# Patient Record
Sex: Male | Born: 1935 | Race: White | Hispanic: No | Smoking: Former smoker
Health system: Southern US, Community
[De-identification: ages and names within clinical notes are randomized; demographics above are authoritative.]

## PROBLEM LIST (undated history)

## (undated) DIAGNOSIS — I4891 Unspecified atrial fibrillation: Secondary | ICD-10-CM

## (undated) DIAGNOSIS — G20A1 Parkinson's disease without dyskinesia, without mention of fluctuations: Secondary | ICD-10-CM

## (undated) DIAGNOSIS — G309 Alzheimer's disease, unspecified: Secondary | ICD-10-CM

## (undated) DIAGNOSIS — G629 Polyneuropathy, unspecified: Secondary | ICD-10-CM

## (undated) DIAGNOSIS — G20 Parkinson's disease: Secondary | ICD-10-CM

## (undated) DIAGNOSIS — R42 Dizziness and giddiness: Secondary | ICD-10-CM

## (undated) DIAGNOSIS — I509 Heart failure, unspecified: Secondary | ICD-10-CM

## (undated) DIAGNOSIS — D649 Anemia, unspecified: Secondary | ICD-10-CM

## (undated) DIAGNOSIS — G479 Sleep disorder, unspecified: Secondary | ICD-10-CM

## (undated) DIAGNOSIS — F419 Anxiety disorder, unspecified: Secondary | ICD-10-CM

## (undated) DIAGNOSIS — Z87442 Personal history of urinary calculi: Secondary | ICD-10-CM

## (undated) DIAGNOSIS — C4490 Unspecified malignant neoplasm of skin, unspecified: Secondary | ICD-10-CM

## (undated) DIAGNOSIS — F0280 Dementia in other diseases classified elsewhere without behavioral disturbance: Secondary | ICD-10-CM

## (undated) DIAGNOSIS — N200 Calculus of kidney: Secondary | ICD-10-CM

## (undated) DIAGNOSIS — I38 Endocarditis, valve unspecified: Secondary | ICD-10-CM

## (undated) DIAGNOSIS — L409 Psoriasis, unspecified: Secondary | ICD-10-CM

## (undated) DIAGNOSIS — E785 Hyperlipidemia, unspecified: Secondary | ICD-10-CM

## (undated) DIAGNOSIS — C61 Malignant neoplasm of prostate: Secondary | ICD-10-CM

## (undated) DIAGNOSIS — K922 Gastrointestinal hemorrhage, unspecified: Secondary | ICD-10-CM

## (undated) DIAGNOSIS — M1990 Unspecified osteoarthritis, unspecified site: Secondary | ICD-10-CM

## (undated) DIAGNOSIS — M4800 Spinal stenosis, site unspecified: Secondary | ICD-10-CM

## (undated) HISTORY — PX: CATARACT EXTRACTION: SUR2

## (undated) HISTORY — PX: PROSTATE SURGERY: SHX751

## (undated) HISTORY — DX: Dementia in other diseases classified elsewhere without behavioral disturbance: F02.80

## (undated) HISTORY — DX: Polyneuropathy, unspecified: G62.9

## (undated) HISTORY — DX: Alzheimer's disease, unspecified: G30.9

## (undated) HISTORY — DX: Unspecified osteoarthritis, unspecified site: M19.90

## (undated) HISTORY — DX: Unspecified malignant neoplasm of skin, unspecified: C44.90

## (undated) HISTORY — DX: Anxiety disorder, unspecified: F41.9

## (undated) HISTORY — DX: Calculus of kidney: N20.0

## (undated) HISTORY — DX: Spinal stenosis, site unspecified: M48.00

## (undated) HISTORY — PX: MOHS SURGERY: SHX181

## (undated) HISTORY — DX: Gastrointestinal hemorrhage, unspecified: K92.2

## (undated) HISTORY — PX: SPINE SURGERY: SHX786

## (undated) HISTORY — DX: Anemia, unspecified: D64.9

## (undated) HISTORY — PX: EYE SURGERY: SHX253

## (undated) HISTORY — DX: Sleep disorder, unspecified: G47.9

## (undated) HISTORY — DX: Endocarditis, valve unspecified: I38

---

## 2006-01-30 DIAGNOSIS — I509 Heart failure, unspecified: Secondary | ICD-10-CM

## 2006-01-30 HISTORY — DX: Heart failure, unspecified: I50.9

## 2006-11-29 DIAGNOSIS — C61 Malignant neoplasm of prostate: Secondary | ICD-10-CM

## 2006-11-29 HISTORY — DX: Malignant neoplasm of prostate: C61

## 2011-10-31 HISTORY — PX: OTHER SURGICAL HISTORY: SHX169

## 2013-07-22 ENCOUNTER — Ambulatory Visit: Payer: Self-pay | Admitting: Urology

## 2013-08-04 ENCOUNTER — Encounter: Payer: Self-pay | Admitting: Neurology

## 2013-08-30 ENCOUNTER — Encounter: Payer: Self-pay | Admitting: Neurology

## 2014-05-02 DIAGNOSIS — Z7901 Long term (current) use of anticoagulants: Secondary | ICD-10-CM | POA: Insufficient documentation

## 2014-05-11 DIAGNOSIS — C61 Malignant neoplasm of prostate: Secondary | ICD-10-CM | POA: Insufficient documentation

## 2014-05-11 DIAGNOSIS — G20 Parkinson's disease: Secondary | ICD-10-CM | POA: Insufficient documentation

## 2014-05-11 DIAGNOSIS — D510 Vitamin B12 deficiency anemia due to intrinsic factor deficiency: Secondary | ICD-10-CM | POA: Insufficient documentation

## 2014-05-11 DIAGNOSIS — K922 Gastrointestinal hemorrhage, unspecified: Secondary | ICD-10-CM | POA: Insufficient documentation

## 2014-05-11 DIAGNOSIS — I509 Heart failure, unspecified: Secondary | ICD-10-CM | POA: Insufficient documentation

## 2014-05-11 DIAGNOSIS — G479 Sleep disorder, unspecified: Secondary | ICD-10-CM | POA: Insufficient documentation

## 2014-05-11 DIAGNOSIS — F419 Anxiety disorder, unspecified: Secondary | ICD-10-CM | POA: Insufficient documentation

## 2014-05-11 DIAGNOSIS — E785 Hyperlipidemia, unspecified: Secondary | ICD-10-CM | POA: Insufficient documentation

## 2014-05-11 DIAGNOSIS — L4050 Arthropathic psoriasis, unspecified: Secondary | ICD-10-CM | POA: Insufficient documentation

## 2014-05-11 DIAGNOSIS — I38 Endocarditis, valve unspecified: Secondary | ICD-10-CM | POA: Insufficient documentation

## 2014-05-11 DIAGNOSIS — G629 Polyneuropathy, unspecified: Secondary | ICD-10-CM | POA: Insufficient documentation

## 2014-05-11 DIAGNOSIS — M48061 Spinal stenosis, lumbar region without neurogenic claudication: Secondary | ICD-10-CM | POA: Insufficient documentation

## 2014-05-11 DIAGNOSIS — N200 Calculus of kidney: Secondary | ICD-10-CM | POA: Insufficient documentation

## 2014-08-01 DIAGNOSIS — I4891 Unspecified atrial fibrillation: Secondary | ICD-10-CM | POA: Insufficient documentation

## 2014-08-18 ENCOUNTER — Ambulatory Visit: Payer: Self-pay | Admitting: Obstetrics and Gynecology

## 2014-11-30 ENCOUNTER — Ambulatory Visit: Payer: Self-pay | Admitting: Rheumatology

## 2015-01-04 DIAGNOSIS — M5136 Other intervertebral disc degeneration, lumbar region: Secondary | ICD-10-CM | POA: Insufficient documentation

## 2015-01-04 DIAGNOSIS — M5116 Intervertebral disc disorders with radiculopathy, lumbar region: Secondary | ICD-10-CM | POA: Insufficient documentation

## 2015-01-04 DIAGNOSIS — M5416 Radiculopathy, lumbar region: Secondary | ICD-10-CM | POA: Insufficient documentation

## 2015-01-21 DIAGNOSIS — G478 Other sleep disorders: Secondary | ICD-10-CM | POA: Insufficient documentation

## 2015-04-14 DIAGNOSIS — K117 Disturbances of salivary secretion: Secondary | ICD-10-CM | POA: Insufficient documentation

## 2015-11-28 ENCOUNTER — Emergency Department: Payer: Medicare Other

## 2015-11-28 ENCOUNTER — Observation Stay
Admit: 2015-11-28 | Discharge: 2015-11-28 | Disposition: A | Discharge status: Home / Self Care | Payer: Medicare Other | Attending: Internal Medicine | Admitting: Internal Medicine

## 2015-11-28 ENCOUNTER — Observation Stay: Payer: Medicare Other

## 2015-11-28 ENCOUNTER — Encounter: Payer: Self-pay | Admitting: Emergency Medicine

## 2015-11-28 ENCOUNTER — Observation Stay
Admission: EM | Admit: 2015-11-28 | Discharge: 2015-11-29 | Disposition: A | Discharge status: Home / Self Care | Payer: Medicare Other | Attending: Internal Medicine | Admitting: Internal Medicine

## 2015-11-28 DIAGNOSIS — W19XXXA Unspecified fall, initial encounter: Secondary | ICD-10-CM | POA: Diagnosis not present

## 2015-11-28 DIAGNOSIS — I509 Heart failure, unspecified: Secondary | ICD-10-CM | POA: Diagnosis not present

## 2015-11-28 DIAGNOSIS — R55 Syncope and collapse: Secondary | ICD-10-CM | POA: Diagnosis not present

## 2015-11-28 DIAGNOSIS — I110 Hypertensive heart disease with heart failure: Secondary | ICD-10-CM | POA: Diagnosis not present

## 2015-11-28 DIAGNOSIS — F0280 Dementia in other diseases classified elsewhere without behavioral disturbance: Secondary | ICD-10-CM

## 2015-11-28 DIAGNOSIS — G20 Parkinson's disease: Secondary | ICD-10-CM | POA: Insufficient documentation

## 2015-11-28 DIAGNOSIS — G309 Alzheimer's disease, unspecified: Secondary | ICD-10-CM | POA: Insufficient documentation

## 2015-11-28 DIAGNOSIS — Z880 Allergy status to penicillin: Secondary | ICD-10-CM | POA: Insufficient documentation

## 2015-11-28 DIAGNOSIS — I69992 Facial weakness following unspecified cerebrovascular disease: Secondary | ICD-10-CM | POA: Insufficient documentation

## 2015-11-28 DIAGNOSIS — I272 Other secondary pulmonary hypertension: Secondary | ICD-10-CM | POA: Diagnosis not present

## 2015-11-28 DIAGNOSIS — Z7952 Long term (current) use of systemic steroids: Secondary | ICD-10-CM | POA: Diagnosis not present

## 2015-11-28 DIAGNOSIS — M4802 Spinal stenosis, cervical region: Secondary | ICD-10-CM | POA: Insufficient documentation

## 2015-11-28 DIAGNOSIS — Z7901 Long term (current) use of anticoagulants: Secondary | ICD-10-CM | POA: Insufficient documentation

## 2015-11-28 DIAGNOSIS — F320 Major depressive disorder, single episode, mild: Secondary | ICD-10-CM | POA: Diagnosis not present

## 2015-11-28 DIAGNOSIS — R634 Abnormal weight loss: Secondary | ICD-10-CM | POA: Insufficient documentation

## 2015-11-28 DIAGNOSIS — F1010 Alcohol abuse, uncomplicated: Secondary | ICD-10-CM | POA: Insufficient documentation

## 2015-11-28 DIAGNOSIS — R42 Dizziness and giddiness: Secondary | ICD-10-CM | POA: Diagnosis not present

## 2015-11-28 DIAGNOSIS — Z87442 Personal history of urinary calculi: Secondary | ICD-10-CM | POA: Insufficient documentation

## 2015-11-28 DIAGNOSIS — Z79899 Other long term (current) drug therapy: Secondary | ICD-10-CM | POA: Diagnosis not present

## 2015-11-28 DIAGNOSIS — I739 Peripheral vascular disease, unspecified: Secondary | ICD-10-CM | POA: Diagnosis not present

## 2015-11-28 DIAGNOSIS — R531 Weakness: Secondary | ICD-10-CM | POA: Diagnosis not present

## 2015-11-28 DIAGNOSIS — I4891 Unspecified atrial fibrillation: Secondary | ICD-10-CM | POA: Insufficient documentation

## 2015-11-28 DIAGNOSIS — Z8546 Personal history of malignant neoplasm of prostate: Secondary | ICD-10-CM | POA: Insufficient documentation

## 2015-11-28 DIAGNOSIS — F419 Anxiety disorder, unspecified: Secondary | ICD-10-CM | POA: Diagnosis not present

## 2015-11-28 DIAGNOSIS — I639 Cerebral infarction, unspecified: Secondary | ICD-10-CM | POA: Diagnosis present

## 2015-11-28 DIAGNOSIS — L409 Psoriasis, unspecified: Secondary | ICD-10-CM | POA: Insufficient documentation

## 2015-11-28 HISTORY — DX: Parkinson's disease without dyskinesia, without mention of fluctuations: G20.A1

## 2015-11-28 HISTORY — DX: Heart failure, unspecified: I50.9

## 2015-11-28 HISTORY — DX: Parkinson's disease: G20

## 2015-11-28 HISTORY — DX: Malignant neoplasm of prostate: C61

## 2015-11-28 HISTORY — DX: Unspecified atrial fibrillation: I48.91

## 2015-11-28 HISTORY — DX: Psoriasis, unspecified: L40.9

## 2015-11-28 HISTORY — DX: Dizziness and giddiness: R42

## 2015-11-28 LAB — URINE DRUG SCREEN, QUALITATIVE (ARMC ONLY)
Amphetamines, Ur Screen: NOT DETECTED
BARBITURATES, UR SCREEN: NOT DETECTED
BENZODIAZEPINE, UR SCRN: POSITIVE — AB
CANNABINOID 50 NG, UR ~~LOC~~: NOT DETECTED
Cocaine Metabolite,Ur ~~LOC~~: NOT DETECTED
MDMA (Ecstasy)Ur Screen: POSITIVE — AB
METHADONE SCREEN, URINE: NOT DETECTED
OPIATE, UR SCREEN: NOT DETECTED
Phencyclidine (PCP) Ur S: NOT DETECTED
TRICYCLIC, UR SCREEN: NOT DETECTED

## 2015-11-28 LAB — URINALYSIS COMPLETE WITH MICROSCOPIC (ARMC ONLY)
Bacteria, UA: NONE SEEN
Bilirubin Urine: NEGATIVE
Glucose, UA: NEGATIVE mg/dL
LEUKOCYTES UA: NEGATIVE
Nitrite: NEGATIVE
PH: 6 (ref 5.0–8.0)
Protein, ur: NEGATIVE mg/dL
SPECIFIC GRAVITY, URINE: 1.015 (ref 1.005–1.030)

## 2015-11-28 LAB — DIFFERENTIAL
BASOS ABS: 0 10*3/uL (ref 0–0.1)
BASOS PCT: 1 %
EOS ABS: 0.1 10*3/uL (ref 0–0.7)
Eosinophils Relative: 2 %
LYMPHS ABS: 0.5 10*3/uL — AB (ref 1.0–3.6)
Lymphocytes Relative: 16 %
Monocytes Absolute: 0.4 10*3/uL (ref 0.2–1.0)
Monocytes Relative: 11 %
NEUTROS PCT: 70 %
Neutro Abs: 2.4 10*3/uL (ref 1.4–6.5)

## 2015-11-28 LAB — ETHANOL: Alcohol, Ethyl (B): <5 mg/dL (ref <5)

## 2015-11-28 LAB — COMPREHENSIVE METABOLIC PANEL
ALBUMIN: 3.8 g/dL (ref 3.5–5.0)
ALT: 5 U/L — ABNORMAL LOW (ref 17–63)
AST: 17 U/L (ref 15–41)
Alkaline Phosphatase: 69 U/L (ref 38–126)
Anion gap: 5 (ref 5–15)
BUN: 21 mg/dL — AB (ref 6–20)
CHLORIDE: 106 mmol/L (ref 101–111)
CO2: 26 mmol/L (ref 22–32)
Calcium: 8.8 mg/dL — ABNORMAL LOW (ref 8.9–10.3)
Creatinine, Ser: 1.15 mg/dL (ref 0.61–1.24)
GFR calc Af Amer: >60 mL/min (ref >60)
GFR calc non Af Amer: 57 mL/min — ABNORMAL LOW (ref >60)
GLUCOSE: 121 mg/dL — AB (ref 65–99)
POTASSIUM: 3.9 mmol/L (ref 3.5–5.1)
SODIUM: 137 mmol/L (ref 135–145)
Total Bilirubin: 0.6 mg/dL (ref 0.3–1.2)
Total Protein: 6.6 g/dL (ref 6.5–8.1)

## 2015-11-28 LAB — GLUCOSE, CAPILLARY: GLUCOSE-CAPILLARY: 127 mg/dL — AB (ref 65–99)

## 2015-11-28 LAB — TROPONIN I

## 2015-11-28 LAB — CBC
HEMATOCRIT: 31.2 % — AB (ref 40.0–52.0)
HEMOGLOBIN: 10.4 g/dL — AB (ref 13.0–18.0)
MCH: 34.5 pg — ABNORMAL HIGH (ref 26.0–34.0)
MCHC: 33.4 g/dL (ref 32.0–36.0)
MCV: 103.3 fL — AB (ref 80.0–100.0)
Platelets: 170 10*3/uL (ref 150–440)
RBC: 3.03 MIL/uL — ABNORMAL LOW (ref 4.40–5.90)
RDW: 13.4 % (ref 11.5–14.5)
WBC: 3.4 10*3/uL — ABNORMAL LOW (ref 3.8–10.6)

## 2015-11-28 LAB — PROTIME-INR
INR: 1.7
Prothrombin Time: 20 seconds — ABNORMAL HIGH (ref 11.4–15.0)

## 2015-11-28 LAB — APTT: APTT: 34 s (ref 24–36)

## 2015-11-28 MED ORDER — ONDANSETRON HCL 4 MG/2ML IJ SOLN: 4.0000 mg | Freq: Four times a day (QID) | INTRAMUSCULAR | Status: DC | PRN

## 2015-11-28 MED ORDER — ATORVASTATIN CALCIUM 20 MG PO TABS
40.0000 mg | ORAL_TABLET | Freq: Every day | ORAL | Status: DC
Start: 2015-11-28 — End: 2015-11-29
  Administered 2015-11-28: 18:00:00 40 mg via ORAL
  Filled 2015-11-28: qty 2

## 2015-11-28 MED ORDER — SENNOSIDES-DOCUSATE SODIUM 8.6-50 MG PO TABS: 1.0000 | ORAL_TABLET | Freq: Every evening | ORAL | Status: DC | PRN

## 2015-11-28 MED ORDER — WARFARIN SODIUM 5 MG PO TABS: 2.5000 mg | ORAL_TABLET | Freq: Every day | ORAL | Status: DC

## 2015-11-28 MED ORDER — WARFARIN SODIUM 5 MG PO TABS: 2.5000 mg | ORAL_TABLET | ORAL | Status: DC

## 2015-11-28 MED ORDER — CHLORDIAZEPOXIDE HCL 10 MG PO CAPS: 10.0000 mg | ORAL_CAPSULE | Freq: Every day | ORAL | Status: DC

## 2015-11-28 MED ORDER — PREDNISONE 5 MG PO TABS
2.5000 mg | ORAL_TABLET | Freq: Every day | ORAL | Status: DC
  Administered 2015-11-29: 2.5 mg via ORAL
  Filled 2015-11-28: qty 1

## 2015-11-28 MED ORDER — ALBUTEROL SULFATE (2.5 MG/3ML) 0.083% IN NEBU
2.5000 mg | INHALATION_SOLUTION | RESPIRATORY_TRACT | Status: DC | PRN
Start: 2015-11-28 — End: 2015-11-29

## 2015-11-28 MED ORDER — WARFARIN SODIUM 5 MG PO TABS: 5.0000 mg | ORAL_TABLET | ORAL | Status: DC

## 2015-11-28 MED ORDER — CARBIDOPA-LEVODOPA 25-100 MG PO TABS
1.5000 | ORAL_TABLET | Freq: Four times a day (QID) | ORAL | Status: DC
  Administered 2015-11-28 – 2015-11-29 (×4): 1.5 via ORAL
  Filled 2015-11-28: qty 1.5
  Filled 2015-11-28: qty 2
  Filled 2015-11-28: qty 1.5
  Filled 2015-11-28: qty 2
  Filled 2015-11-28: qty 1.5
  Filled 2015-11-28: qty 2

## 2015-11-28 MED ORDER — SODIUM CHLORIDE 0.9 % IJ SOLN
3.0000 mL | Freq: Two times a day (BID) | INTRAMUSCULAR | Status: DC
  Administered 2015-11-28 – 2015-11-29 (×2): 3 mL via INTRAVENOUS

## 2015-11-28 MED ORDER — ASPIRIN 81 MG PO CHEW
324.0000 mg | CHEWABLE_TABLET | Freq: Once | ORAL | Status: AC
  Administered 2015-11-28: 324 mg via ORAL
  Filled 2015-11-28: qty 4

## 2015-11-28 MED ORDER — ALFUZOSIN HCL ER 10 MG PO TB24
10.0000 mg | ORAL_TABLET | Freq: Every day | ORAL | Status: DC
  Administered 2015-11-28 – 2015-11-29 (×2): 10 mg via ORAL
  Filled 2015-11-28 (×2): qty 1

## 2015-11-28 MED ORDER — FOLIC ACID 1 MG PO TABS
1.0000 mg | ORAL_TABLET | Freq: Every day | ORAL | Status: DC
  Administered 2015-11-28 – 2015-11-29 (×2): 1 mg via ORAL
  Filled 2015-11-28 (×2): qty 1

## 2015-11-28 MED ORDER — WARFARIN SODIUM 5 MG PO TABS
5.0000 mg | ORAL_TABLET | ORAL | Status: DC
  Administered 2015-11-28: 18:00:00 5 mg via ORAL
  Filled 2015-11-28: qty 1

## 2015-11-28 MED ORDER — ACETAMINOPHEN 325 MG PO TABS: 650.0000 mg | ORAL_TABLET | Freq: Four times a day (QID) | ORAL | Status: DC | PRN

## 2015-11-28 MED ORDER — CLONAZEPAM 0.5 MG PO TABS
0.5000 mg | ORAL_TABLET | Freq: Every day | ORAL | Status: DC
  Administered 2015-11-28: 0.5 mg via ORAL
  Filled 2015-11-28: qty 1

## 2015-11-28 MED ORDER — CHLORDIAZEPOXIDE HCL 10 MG PO CAPS: 10.0000 mg | ORAL_CAPSULE | Freq: Three times a day (TID) | ORAL | Status: DC

## 2015-11-28 MED ORDER — ACETAMINOPHEN 650 MG RE SUPP: 650.0000 mg | Freq: Four times a day (QID) | RECTAL | Status: DC | PRN

## 2015-11-28 MED ORDER — FENOFIBRATE 160 MG PO TABS
160.0000 mg | ORAL_TABLET | Freq: Every evening | ORAL | Status: DC
Start: 2015-11-28 — End: 2015-11-29
  Administered 2015-11-28: 160 mg via ORAL
  Filled 2015-11-28: qty 1

## 2015-11-28 MED ORDER — HYDROCODONE-ACETAMINOPHEN 5-325 MG PO TABS: 1.0000 | ORAL_TABLET | ORAL | Status: DC | PRN

## 2015-11-28 MED ORDER — SODIUM CHLORIDE 0.9 % IV SOLN: 250.0000 mL | INTRAVENOUS | Status: DC | PRN

## 2015-11-28 MED ORDER — ONDANSETRON HCL 4 MG PO TABS: 4.0000 mg | ORAL_TABLET | Freq: Four times a day (QID) | ORAL | Status: DC | PRN

## 2015-11-28 MED ORDER — WARFARIN - PHYSICIAN DOSING INPATIENT: Freq: Every day | Status: DC

## 2015-11-28 MED ORDER — MELATONIN 3 MG PO CAPS: 3.0000 mg | ORAL_CAPSULE | Freq: Every evening | ORAL | Status: DC

## 2015-11-28 MED ORDER — ROPINIROLE HCL 1 MG PO TABS
5.0000 mg | ORAL_TABLET | Freq: Four times a day (QID) | ORAL | Status: DC
  Administered 2015-11-28 – 2015-11-29 (×4): 5 mg via ORAL
  Filled 2015-11-28 (×8): qty 5

## 2015-11-28 NOTE — ED Notes (Signed)
Pt nurse from [facility] here to give medication history to Education administrator. [place] RN reports change in pt baseline behavior since his wife died two months ago. Reports that pt is more confused and has been hallucinating. Reports that pt eating and sleeping habits have changed. Reports pt has been drinking alcohol. Reports that he will leave the facility in his car and return hours later.

## 2015-11-28 NOTE — ED Notes (Addendum)
Stroke nurse comments:  The patient arrived via EMS from an assisted living facility at [place], code stroke initiated and pt sent to CT for rt sided facial droop, weakness and syncopal episode (pt fell and struck the back of his head this morning)  with a best established LKW at 0830 today. NIHSS of 2 reflecting double vision and rt sided facial droop. Pt is alert and oriented. Pt takes coumadin for A-fib. SOC responded at 0958, after assessment, the [facility] stated that the pt's LKW was not very clear and symptoms too mild to treat. Primary nurse Opal Sidles RN will contact the pt's AL facility for clarification of pt's LKW and any symptoms that are new. Pt to be admitted for further treatment.

## 2015-11-28 NOTE — ED Provider Notes (Signed)
[facility] Emergency Department Provider Note  ____________________________________________  Time seen: Seen upon arrival to the emergency department  I have reviewed the triage vital signs and the nursing notes.   HISTORY  Chief Complaint Fall and Near Syncope    HPI [name] [name] is a 81 y.o. male with a history of Parkinson's as well as atrial fibrillation on Coumadin who is presenting today after a fall. He says that he did not slip but became dizzy and then fell backwards. He says this happened at about 8:30 AM. He says that he is no longer dizzy at this time but says that he feels weak all over. He denies any pain. Says that prior to 8:30 this morning he was feeling at his normal baseline which is weak all over. He says that he has not had a dizziness episode like this in the past.Says that he does have ringing in his bilateral ears but no pressure.   Past Medical History  Diagnosis Date  . Atrial fibrillation ([place])   . Parkinson's disease ([place])     There are no active problems to display for this patient.   No past surgical history on file.  No current outpatient prescriptions on file.  Allergies Penicillins  No family history on file.  Social History Social History  Substance Use Topics  . Smoking status: None  . Smokeless tobacco: None  . Alcohol Use: None    Review of Systems Constitutional: No fever/chills Eyes: No visual changes. ENT: No sore throat. Cardiovascular: Denies chest pain. Respiratory: Denies shortness of breath. Gastrointestinal: No abdominal pain.  No nausea, no vomiting.  No diarrhea.  No constipation. Genitourinary: Negative for dysuria. Musculoskeletal: Negative for back pain. Skin: Negative for rash. Neurological: Negative for headaches, focal weakness or numbness.  10-point ROS otherwise negative.  ____________________________________________   PHYSICAL EXAM:  VITAL SIGNS: ED Triage Vitals  Enc  Vitals Group     BP --      Pulse --      Resp --      Temp --      Temp src --      SpO2 --      Weight --      Height --      Head Cir --      Peak Flow --      Pain Score --      Pain Loc --      Pain Edu? --      Excl. in [place]? --     Constitutional: Alert and oriented. Well appearing and in no acute distress. Eyes: Conjunctivae are normal. PERRL. EOMI. Head: Atraumatic. Normal TMs bilaterally. Nose: No congestion/rhinnorhea. Mouth/Throat: Mucous membranes are moist.  Oropharynx non-erythematous. Neck: No stridor.   Cardiovascular: Normal rate, regular rhythm. Grossly normal heart sounds.  Good peripheral circulation. Respiratory: Normal respiratory effort.  No retractions. Lungs CTAB. Gastrointestinal: Soft and nontender. No distention. No abdominal bruits. No CVA tenderness. Musculoskeletal: No lower extremity tenderness nor edema.  No joint effusions. Neurologic:  Normal speech and language. Right-sided facial droop and the central distribution. Skin:  Skin is warm, dry and intact. No rash noted. Psychiatric: Mood and affect are normal. Speech and behavior are normal.  NIH Stroke Scale   Person Administering Scale: Doran Stabler  Administer stroke scale items in the order listed. Record performance in each category after each subscale exam. Do not go back and change scores. Follow directions provided for each exam technique.  Scores should reflect what the patient does, not what the clinician thinks the patient can do. The clinician should record answers while administering the exam and work quickly. Except where indicated, the patient should not be coached (i.e., repeated requests to patient to make a special effort).   1a  Level of consciousness: 0=alert; keenly responsive  1b. LOC questions:  0=Performs both tasks correctly  1c. LOC commands: 0=Performs both tasks correctly  2.  Best Gaze: 0=normal  3.  Visual: 0=No visual loss  4. Facial Palsy: 1=Minor  paralysis (flattened nasolabial fold, asymmetric on smiling)  5a.  Motor left arm: 0=No drift, limb holds 90 (or 45) degrees for full 10 seconds  5b.  Motor right arm: 0=No drift, limb holds 90 (or 45) degrees for full 10 seconds  6a. motor left leg: 0=No drift, limb holds 90 (or 45) degrees for full 10 seconds  6b  Motor right leg:  0=No drift, limb holds 90 (or 45) degrees for full 10 seconds  7. Limb Ataxia: 0=Absent  8.  Sensory: 0=Normal; no sensory loss  9. Best Language:  0=No aphasia, normal  10. Dysarthria: 0=Normal  11. Extinction and Inattention: 0=No abnormality  12. Distal motor function: 0=Normal   Total:   1   ____________________________________________   LABS (all labs ordered are listed, but only abnormal results are displayed)  Labs Reviewed  PROTIME-INR - Abnormal; Notable for the following:    Prothrombin Time 20.0 (*)    All other components within normal limits  CBC - Abnormal; Notable for the following:    WBC 3.4 (*)    RBC 3.03 (*)    Hemoglobin 10.4 (*)    HCT 31.2 (*)    MCV 103.3 (*)    MCH 34.5 (*)    All other components within normal limits  DIFFERENTIAL - Abnormal; Notable for the following:    Lymphs Abs 0.5 (*)    All other components within normal limits  COMPREHENSIVE METABOLIC PANEL - Abnormal; Notable for the following:    Glucose, Bld 121 (*)    BUN 21 (*)    Calcium 8.8 (*)    ALT 5 (*)    GFR calc non Af Amer 57 (*)    All other components within normal limits  GLUCOSE, CAPILLARY - Abnormal; Notable for the following:    Glucose-Capillary 127 (*)    All other components within normal limits  APTT  TROPONIN I  ETHANOL  URINALYSIS COMPLETEWITH MICROSCOPIC ([facility] ONLY)  URINE DRUG SCREEN, QUALITATIVE ([facility] ONLY)  I-STAT TROPOININ, ED  I-STAT CHEM 8, ED  I-STAT TROPOININ, ED   ____________________________________________  EKG  ED ECG REPORT I, [place],  Youlanda Roys, the attending physician, personally viewed and interpreted  this ECG.   Date: 11/28/2015  EKG Time: 1026  Rate: 105  Rhythm: atrial fibrillation, rate 105  Axis: Normal axis  Intervals: Long QTC at 504  ST&T Change: No ST segment elevation or depression. No abnormal T-wave inversion. Poor baseline in leads 1,2 and 3.  ____________________________________________  RADIOLOGY  No acute findings on the CAT scan of the brain. ____________________________________________   PROCEDURES  CRITICAL CARE Performed by: Doran Stabler   Total critical care time: 35 minutes  Critical care time was exclusive of separately billable procedures and treating other patients.  Critical care was necessary to treat or prevent imminent or life-threatening deterioration.  Critical care was time spent personally by me on the following activities: development of treatment plan with  patient and/or surrogate as well as nursing, discussions with consultants, evaluation of patient's response to treatment, examination of patient, obtaining history from patient or surrogate, ordering and performing treatments and interventions, ordering and review of laboratory studies, ordering and review of radiographic studies, pulse oximetry and re-evaluation of patient's condition.  TPA in consideration because the patient is within the window for this. He is on Coumadin and we will be checking his INR level to make sure that his INR is within a safe range for TPA. ____________________________________________   INITIAL IMPRESSION / ASSESSMENT AND PLAN / ED COURSE  Pertinent labs & imaging results that were available during my care of the patient were reviewed by me and considered in my medical decision making (see chart for details).  Patient made a stroke alert and call put out the teleneurology for consultation.  ----------------------------------------- 10:55 AM on 11/28/2015 -----------------------------------------  Discussed case with the [place] neurologist who  recommends admission. Does not recommend TPA at this time. Signed out to Dr. Darvin Neighbours. We'll give aspirin. INR at 1.7. ____________________________________________   FINAL CLINICAL IMPRESSION(S) / ED DIAGNOSES  Acute CVA.    Orbie Pyo, MD 11/28/15 1055

## 2015-11-28 NOTE — Progress Notes (Signed)
*  PRELIMINARY RESULTS* Echocardiogram 2D Echocardiogram has been performed.  [name] [name] 11/28/2015, 6:06 PM

## 2015-11-28 NOTE — ED Notes (Signed)
Patient transported to MRI 

## 2015-11-28 NOTE — Progress Notes (Signed)
Assumed patient care from prior RN 3:00 pm.   VSS No c/o pain  Patient is A&O One person assist to bathroom Patient is scoring a 2 on NIH Possible discharge tomorrow

## 2015-11-28 NOTE — Progress Notes (Signed)
Spoke with pharmacy and they stated not to give coumadin at 1415 but at 1800

## 2015-11-28 NOTE — Progress Notes (Signed)
   11/28/15 1000  Clinical Encounter Type  Visited With Patient  Visit Type Initial  Referral From Nurse  Consult/Referral To Chaplain  Spiritual Encounters  Spiritual Needs Emotional  Stress Factors  Patient Stress Factors Health changes  Visited with PT. Provided emotional support. Burley Saver [phone]

## 2015-11-28 NOTE — ED Notes (Signed)
Pt arrived via EMS from [facility] for an unwitnessed fall. Pt states he became dizzy and thinks he passed out. Pt states hit head on back of door when he fell.

## 2015-11-28 NOTE — H&P (Signed)
[place] [facility] [place] NAME: [name] [name]    MR#:  [id number]  DATE OF BIRTH:  12/28/1930  DATE OF ADMISSION:  11/28/2015  PRIMARY CARE PHYSICIAN: No primary care provider on file.   REQUESTING/REFERRING PHYSICIAN: Dr. Lucita Lora  CHIEF COMPLAINT:   Chief Complaint  Patient presents with  . Fall  . Near Syncope    HISTORY OF PRESENT ILLNESS:  [name] [name]  is a 81 y.o. male with a known history of A. fib, hypertension, psoriasis who is a resident of independent living presents to the hospital after he had a fall earlier today. He got acutely dizzy trying to reach for something on the wall and fell backwards hitting his head. No trauma. No loss of consciousness. Patient was noticed to have new right-sided facial droop and brought to the emergency room. Here CT scan of the head showed nothing acute. Was seen by tele neurology and recommended admission. Patient has no complaints of any dysphagia, change in vision, focal weakness or numbness.  PAST MEDICAL HISTORY:   Past Medical History  Diagnosis Date  . Atrial fibrillation ([place])   . Parkinson's disease ([place])   . CHF (congestive heart failure) ([place])   . Prostate cancer ([place])   . Vertigo   . Psoriasis     PAST SURGICAL HISTORY:  No past surgical history on file.  SOCIAL HISTORY:   Social History  Substance Use Topics  . Smoking status: Never Smoker   . Smokeless tobacco: Not on file  . Alcohol Use: Not on file    FAMILY HISTORY:   Family History  Problem Relation Age of Onset  . Hypertension Mother     DRUG ALLERGIES:   Allergies  Allergen Reactions  . Penicillins     REVIEW OF SYSTEMS:   Review of Systems  Constitutional: Negative for fever and chills.  HENT: Negative for sore throat.   Eyes: Negative for blurred vision, double vision and pain.  Respiratory: Negative for cough, hemoptysis, shortness of breath and wheezing.   Cardiovascular: Negative  for chest pain, palpitations, orthopnea and leg swelling.  Gastrointestinal: Negative for heartburn, nausea, vomiting, abdominal pain, diarrhea and constipation.  Genitourinary: Negative for dysuria and hematuria.  Musculoskeletal: Negative for back pain and joint pain.  Skin: Negative for rash.  Neurological: Positive for dizziness. Negative for sensory change, speech change, focal weakness and headaches.  Endo/Heme/Allergies: Does not bruise/bleed easily.  Psychiatric/Behavioral: Negative for depression. The patient is not nervous/anxious.     MEDICATIONS AT HOME:   Prior to Admission medications   Medication Sig Start Date End Date Taking? Authorizing Provider  acetaminophen (TYLENOL) 500 MG tablet Take 500 mg by mouth 2 (two) times daily.   Yes Historical Provider, MD  alfuzosin (UROXATRAL) 10 MG 24 hr tablet Take 10 mg by mouth daily.   Yes Historical Provider, MD  carbidopa-levodopa (SINEMET IR) 25-100 MG tablet Take 1 tablet by mouth 4 (four) times daily.   Yes Historical Provider, MD  chlordiazePOXIDE (LIBRIUM) 10 MG capsule Take 10 mg by mouth 3 (three) times daily.   Yes Historical Provider, MD  cholecalciferol (VITAMIN D) 1000 UNITS tablet Take 1,000 Units by mouth daily.   Yes Historical Provider, MD  clonazePAM (KLONOPIN) 1 MG tablet Take 1 mg by mouth every evening.   Yes Historical Provider, MD  fenofibrate (TRICOR) 145 MG tablet Take 145 mg by mouth daily.   Yes Historical Provider, MD  folic acid (FOLVITE) 1 MG tablet  Take 1 mg by mouth daily.   Yes Historical Provider, MD  hydrochlorothiazide (MICROZIDE) 12.5 MG capsule Take 12.5 mg by mouth 3 (three) times a week.   Yes Historical Provider, MD  hydrochlorothiazide (MICROZIDE) 12.5 MG capsule Take 12.5 mg by mouth 3 (three) times daily. On Monday, Wednesday, and Friday   Yes Historical Provider, MD  Melatonin 3 MG CAPS Take 3 mg by mouth every evening.   Yes Historical Provider, MD  methotrexate 2.5 MG tablet Take 15 mg by  mouth once a week. On Wednesday with a meal   Yes Historical Provider, MD  multivitamin-iron-minerals-folic acid (THERAPEUTIC-M) TABS tablet Take 1 tablet by mouth daily.   Yes Historical Provider, MD  predniSONE (DELTASONE) 2.5 MG tablet Take 2.5 mg by mouth daily with breakfast. 11/21/15  Yes Historical Provider, MD  ropinirole (REQUIP) 5 MG tablet Take 5 mg by mouth QID.   Yes Historical Provider, MD  vitamin B-12 (CYANOCOBALAMIN) 1000 MCG tablet Take 1,000 mcg by mouth daily.   Yes Historical Provider, MD  warfarin (COUMADIN) 2.5 MG tablet Take 2.5 mg by mouth daily.   Yes Historical Provider, MD  warfarin (COUMADIN) 5 MG tablet Take 5 mg by mouth daily. On Sunday, Tuesday, and Thursday at 6pm   Yes Historical Provider, MD      VITAL SIGNS:  Blood pressure 119/68, pulse 62, temperature 97.4 F (36.3 C), resp. rate 22, SpO2 97 %.  PHYSICAL EXAMINATION:  Physical Exam  GENERAL:  81 y.o.-year-old patient lying in the bed with no acute distress.  EYES: Pupils equal, round, reactive to light and accommodation. No scleral icterus. Extraocular muscles intact.  HEENT: Head atraumatic, normocephalic. Oropharynx and nasopharynx clear. No oropharyngeal erythema, moist oral mucosa  NECK:  Supple, no jugular venous distention. No thyroid enlargement, no tenderness.  LUNGS: Normal breath sounds bilaterally, no wheezing, rales, rhonchi. No use of accessory muscles of respiration.  CARDIOVASCULAR: S1, S2 normal. No murmurs, rubs, or gallops.  ABDOMEN: Soft, nontender, nondistended. Bowel sounds present. No organomegaly or mass.  EXTREMITIES: No pedal edema, cyanosis, or clubbing. + 2 pedal & radial pulses b/l.   NEUROLOGIC: Cranial nerves II through XII are intact. No focal Motor or sensory deficits appreciated b/l. Right facial droop PSYCHIATRIC: The patient is alert and awake. Oriented to place and time. SKIN: No obvious rash, lesion, or ulcer.   LABORATORY PANEL:   CBC  Recent Labs Lab  11/28/15 1015  WBC 3.4*  HGB 10.4*  HCT 31.2*  PLT 170   ------------------------------------------------------------------------------------------------------------------  Chemistries   Recent Labs Lab 11/28/15 1015  NA 137  K 3.9  CL 106  CO2 26  GLUCOSE 121*  BUN 21*  CREATININE 1.15  CALCIUM 8.8*  AST 17  ALT 5*  ALKPHOS 69  BILITOT 0.6   ------------------------------------------------------------------------------------------------------------------  Cardiac Enzymes  Recent Labs Lab 11/28/15 1015  TROPONINI <0.03   ------------------------------------------------------------------------------------------------------------------  RADIOLOGY:  Ct Head Wo Contrast  11/28/2015  CLINICAL DATA:  Dizziness after fall.  Right facial droop EXAM: CT HEAD WITHOUT CONTRAST TECHNIQUE: Contiguous axial images were obtained from the base of the skull through the vertex without intravenous contrast. COMPARISON:  None. FINDINGS: There is age related volume loss. There is no intracranial mass, hemorrhage, extra-axial fluid collection, or midline shift. There is mild small vessel disease in the centra semiovale bilaterally. There is also small vessel disease in each internal capsule anteriorly. No acute infarct evident. Middle cerebral artery attenuation is symmetric and normal bilaterally. The bony calvarium appears intact.  The mastoid air cells are clear. There is mild ethmoid sinus disease bilaterally. No intraorbital lesions are appreciable. IMPRESSION: Age related volume loss with mild supratentorial small vessel disease. No acute infarct evident. No mass or hemorrhage. Mild ethmoid sinus disease bilaterally. Critical Value/emergent results were called by telephone at the time of interpretation on 11/28/2015 at 10:02 am to Dr. Larae Grooms , who verbally acknowledged these results. Electronically Signed   By: Lowella Grip III M.D.   On: 11/28/2015 10:02     IMPRESSION AND  PLAN:   * Acute CVA with right-sided facial droop Seen by putting neurology and advised admission. Admit to medical bed. Patient has history of atrial fibrillation and is on Coumadin. Check echocardiogram, carotid Doppler, MRI. Neuro checks. Consult neurology.  * A. fib, rate control Continue Medication  * Hypertension Hold medications for permissive hypertension  * Dementia Patient's wife passed away 2 months prior as per nurse from [place]. Patient has been slowly worsening with his mental status since then.  * Alcohol abuse Spoke with Nurse at [facility] and patient is likely drinking alcohol daily. Not confirmed. Start CIWA if any withdrawals noticed.  * DVT prophylaxis On Coumadin   All the records are reviewed and case discussed with ED provider. Management plans discussed with the patient, family and they are in agreement.  CODE STATUS: FULL  TOTAL TIME TAKING CARE OF THIS PATIENT: 40 minutes.    Hillary Bow R M.D on 11/28/2015 at 11:35 AM  Between 7am to 6pm - Pager - [phone]  After 6pm go to [email or web address] - password [email or web address] [facility]  [place] [facility]  Office  [phone]  CC: Primary care physician; No primary care provider on file.     Note: This dictation was prepared with Dragon dictation along with smaller phrase technology. Any transcriptional errors that result from this process are unintentional.

## 2015-11-29 DIAGNOSIS — I639 Cerebral infarction, unspecified: Secondary | ICD-10-CM | POA: Diagnosis not present

## 2015-11-29 DIAGNOSIS — G309 Alzheimer's disease, unspecified: Secondary | ICD-10-CM

## 2015-11-29 DIAGNOSIS — F320 Major depressive disorder, single episode, mild: Secondary | ICD-10-CM | POA: Diagnosis not present

## 2015-11-29 DIAGNOSIS — F0280 Dementia in other diseases classified elsewhere without behavioral disturbance: Secondary | ICD-10-CM

## 2015-11-29 HISTORY — DX: Alzheimer's disease, unspecified: G30.9

## 2015-11-29 HISTORY — DX: Dementia in other diseases classified elsewhere, unspecified severity, without behavioral disturbance, psychotic disturbance, mood disturbance, and anxiety: F02.80

## 2015-11-29 LAB — BASIC METABOLIC PANEL
ANION GAP: 6 (ref 5–15)
BUN: 19 mg/dL (ref 6–20)
CHLORIDE: 108 mmol/L (ref 101–111)
CO2: 26 mmol/L (ref 22–32)
Calcium: 8.8 mg/dL — ABNORMAL LOW (ref 8.9–10.3)
Creatinine, Ser: 1.26 mg/dL — ABNORMAL HIGH (ref 0.61–1.24)
GFR calc Af Amer: 59 mL/min — ABNORMAL LOW (ref >60)
GFR, EST NON AFRICAN AMERICAN: 51 mL/min — AB (ref >60)
GLUCOSE: 95 mg/dL (ref 65–99)
POTASSIUM: 3.6 mmol/L (ref 3.5–5.1)
SODIUM: 140 mmol/L (ref 135–145)

## 2015-11-29 LAB — LIPID PANEL
CHOL/HDL RATIO: 3.1 ratio
Cholesterol: 136 mg/dL (ref 0–200)
HDL: 44 mg/dL (ref >40)
LDL CALC: 80 mg/dL (ref 0–99)
TRIGLYCERIDES: 59 mg/dL (ref <150)
VLDL: 12 mg/dL (ref 0–40)

## 2015-11-29 LAB — CBC
HCT: 30.5 % — ABNORMAL LOW (ref 40.0–52.0)
HEMOGLOBIN: 10.3 g/dL — AB (ref 13.0–18.0)
MCH: 34.8 pg — ABNORMAL HIGH (ref 26.0–34.0)
MCHC: 33.8 g/dL (ref 32.0–36.0)
MCV: 102.8 fL — ABNORMAL HIGH (ref 80.0–100.0)
PLATELETS: 163 10*3/uL (ref 150–440)
RBC: 2.97 MIL/uL — AB (ref 4.40–5.90)
RDW: 13.4 % (ref 11.5–14.5)
WBC: 4 10*3/uL (ref 3.8–10.6)

## 2015-11-29 LAB — PROTIME-INR
INR: 1.74
PROTHROMBIN TIME: 20.3 s — AB (ref 11.4–15.0)

## 2015-11-29 MED ORDER — CITALOPRAM HYDROBROMIDE 10 MG PO TABS: 10.0000 mg | ORAL_TABLET | Freq: Every day | ORAL | Status: DC

## 2015-11-29 MED ORDER — METHOTREXATE 2.5 MG PO TABS
15.0000 mg | ORAL_TABLET | ORAL | Status: DC
  Administered 2015-11-29: 15 mg via ORAL
  Filled 2015-11-29: qty 6

## 2015-11-29 MED ORDER — CARBIDOPA-LEVODOPA ER 50-200 MG PO TBCR: 1.0000 | EXTENDED_RELEASE_TABLET | Freq: Every day | ORAL | Status: DC

## 2015-11-29 MED ORDER — CARBIDOPA-LEVODOPA 25-100 MG PO TABS: 1.5000 | ORAL_TABLET | Freq: Four times a day (QID) | ORAL | Status: DC

## 2015-11-29 MED ORDER — MEMANTINE HCL 5 MG PO TABS: 5.0000 mg | ORAL_TABLET | Freq: Every day | ORAL | Status: DC

## 2015-11-29 MED ORDER — WARFARIN SODIUM 5 MG PO TABS: 5.0000 mg | ORAL_TABLET | ORAL | Status: DC

## 2015-11-29 MED ORDER — CARBIDOPA-LEVODOPA ER 50-200 MG PO TBCR
1.0000 | EXTENDED_RELEASE_TABLET | Freq: Every day | ORAL | Status: DC
  Filled 2015-11-29: qty 1

## 2015-11-29 MED ORDER — ATORVASTATIN CALCIUM 40 MG PO TABS: 40.0000 mg | ORAL_TABLET | Freq: Every day | ORAL | Status: DC

## 2015-11-29 NOTE — Progress Notes (Signed)
Discharge instructions given to daughter with prescriptions-escorted to car

## 2015-11-29 NOTE — Care Management (Signed)
Admitted to [facility] with the diagnosis of CVA. Lives at [facility] since November 2014. daughter is Derl Barrow [phone]). No home health. No skilled facility. No home oxygen. Uses a rolling walker/cane to aid in ambulation. Gets VIP help at [facility]. A person comes to Mr. Eno home and helps with medication management, light housework, meals. Last seen Dr. Doy Hutching about a month ago. [place] prior to this admission. Good appetite. Mr. Satkowiak has a long term life insurance policy per daughter. Possible discharge today per Dr Anselm Jungling. Shelbie Ammons RN MSN CCM Care Management [phone]

## 2015-11-29 NOTE — Care Management Obs Status (Signed)
[place] NOTIFICATION   Patient Details  Name: Imaan Ayson MRN: [id number] Date of Birth: [birth year]   Medicare Observation Status Notification Given:  Yes    Shelbie Ammons, RN 11/29/2015, 11:06 AM

## 2015-11-29 NOTE — Plan of Care (Addendum)
No c/o pain. VSS, afebrile. NIH 2. Neuro consulted. Will continue to assess.   Problem: Safety: Goal: Ability to remain free from injury will improve Outcome: Progressing High fall risk. Bed alarm on, hourly rounding. Pt understands how to use call system for assistance. Safe environment provided.   Problem: Health Behavior/Discharge Planning: Goal: Ability to manage health-related needs will improve Outcome: Progressing [facility] from [facility]. Unsteady, shuffling gait. Pt expressed concern about returning to live alone & is interested in finding someone to stay w/ him 24/7 to help w/ ADL's.

## 2015-11-29 NOTE — Progress Notes (Signed)
ANTICOAGULATION CONSULT NOTE - Initial Consult  Pharmacy Consult for warfarin Indication: atrial fibrillation  Allergies  Allergen Reactions  . Penicillins     Patient Measurements: Height: 5\' 7"  (170.2 cm) Weight: 172 lb 14.4 oz (78.427 kg) IBW/kg (Calculated) : 66.1   Vital Signs: Temp: 97.6 F (36.4 C) (11/30 1347) Temp Source: Oral (11/30 1347) BP: 124/62 mmHg (11/30 1347) Pulse Rate: 64 (11/30 1347)  Labs:  Recent Labs  11/28/15 1015 11/29/15 0435  HGB 10.4* 10.3*  HCT 31.2* 30.5*  PLT 170 163  APTT 34  --   LABPROT 20.0* 20.3*  INR 1.70 1.74  CREATININE 1.15 1.26*  TROPONINI <0.03  --     Estimated Creatinine Clearance: 41.5 mL/min (by C-G formula based on Cr of 1.26).   Medical History: Past Medical History  Diagnosis Date  . Atrial fibrillation ([place])   . Parkinson's disease ([place])   . CHF (congestive heart failure) ([place])   . Prostate cancer ([place])   . Vertigo   . Psoriasis     Medications:  Scheduled:  . alfuzosin  10 mg Oral Daily  . atorvastatin  40 mg Oral q1800  . carbidopa-levodopa  1 tablet Oral QHS  . carbidopa-levodopa  1.5 tablet Oral QID  . clonazePAM  0.5 mg Oral QHS  . fenofibrate  160 mg Oral QPM  . folic acid  1 mg Oral Daily  . methotrexate  15 mg Oral Weekly  . predniSONE  2.5 mg Oral Q breakfast  . ropinirole  5 mg Oral QID  . sodium chloride  3 mL Intravenous Q12H  . warfarin  2.5 mg Oral Q M,W,F,Sa-1800  . [START ON 11/30/2015] warfarin  5 mg Oral Once per day on Sun Tue Thu  . Warfarin - Physician Dosing Inpatient   Does not apply q1800    Assessment: Pharmacy consulted to dose warfarin in an 81 yo male with afib.  Patient admitted with CVA.  INR today of 1.74.  PTA dosing of warfarin 2.5 mg on M,W,F,Sat and 5 mg on all other days.   Goal of Therapy:  INR 2-3 Monitor platelets by anticoagulation protocol: Yes   Plan:  Will continue current warfarin dosing as INR is close to therapeutic.  Last dose per med rec  was on 11/28.  Unsure if patient received dose on 11/29 (no charted doses).  Will recheck INR in AM and assess need for dosing changes.   Pharmacy will continue to follow.  Amilya Haver G 11/29/2015,3:00 PM

## 2015-11-29 NOTE — Consult Note (Signed)
Reason for Consult: stroke Referring Physician:  Dr. Vachhani  [name] [name] is an 81 y.o. male.  HPI: 81 yo RHD  F presents to [facility] secondary to passing out.  Pt remembers most of the episode.  He recalls getting up and using the restroom and then trying to walk back to bed when he passed out.  He denied any chest pain, racing heart, nausea/vomiting or headache.  He did not have any tongue biting or incontinence.  He does not have a regular neurologist to adjust his Parkinsons meds.  He hopes to play golf again soon.  Past Medical History  Diagnosis Date  . Atrial fibrillation (HCC)   . Parkinson's disease (HCC)   . CHF (congestive heart failure) (HCC)   . Prostate cancer (HCC)   . Vertigo   . Psoriasis     No past surgical history on file.  Family History  Problem Relation Age of Onset  . Hypertension Mother     Social History:  reports that he has never smoked. He does not have any smokeless tobacco history on file. His alcohol and drug histories are not on file.  Allergies:  Allergies  Allergen Reactions  . Penicillins     Medications: personally reviewed by me as per chart  Results for orders placed or performed during the hospital encounter of 11/28/15 (from the past 48 hour(s))  Ethanol     Status: None   Collection Time: 11/28/15 10:15 AM  Result Value Ref Range   Alcohol, Ethyl (B) <5 <5 mg/dL    Comment:        LOWEST DETECTABLE LIMIT FOR SERUM ALCOHOL IS 5 mg/dL FOR MEDICAL PURPOSES ONLY   Protime-INR     Status: Abnormal   Collection Time: 11/28/15 10:15 AM  Result Value Ref Range   Prothrombin Time 20.0 (H) 11.4 - 15.0 seconds   INR 1.70   APTT     Status: None   Collection Time: 11/28/15 10:15 AM  Result Value Ref Range   aPTT 34 24 - 36 seconds  CBC     Status: Abnormal   Collection Time: 11/28/15 10:15 AM  Result Value Ref Range   WBC 3.4 (L) 3.8 - 10.6 K/uL   RBC 3.03 (L) 4.40 - 5.90 MIL/uL   Hemoglobin 10.4 (L) 13.0 - 18.0 g/dL   HCT 31.2  (L) 40.0 - 52.0 %   MCV 103.3 (H) 80.0 - 100.0 fL   MCH 34.5 (H) 26.0 - 34.0 pg   MCHC 33.4 32.0 - 36.0 g/dL   RDW 13.4 11.5 - 14.5 %   Platelets 170 150 - 440 K/uL  Differential     Status: Abnormal   Collection Time: 11/28/15 10:15 AM  Result Value Ref Range   Neutrophils Relative % 70 %   Neutro Abs 2.4 1.4 - 6.5 K/uL   Lymphocytes Relative 16 %   Lymphs Abs 0.5 (L) 1.0 - 3.6 K/uL   Monocytes Relative 11 %   Monocytes Absolute 0.4 0.2 - 1.0 K/uL   Eosinophils Relative 2 %   Eosinophils Absolute 0.1 0 - 0.7 K/uL   Basophils Relative 1 %   Basophils Absolute 0.0 0 - 0.1 K/uL  Comprehensive metabolic panel     Status: Abnormal   Collection Time: 11/28/15 10:15 AM  Result Value Ref Range   Sodium 137 135 - 145 mmol/L   Potassium 3.9 3.5 - 5.1 mmol/L   Chloride 106 101 - 111 mmol/L   CO2 26 22 -   32 mmol/L   Glucose, Bld 121 (H) 65 - 99 mg/dL   BUN 21 (H) 6 - 20 mg/dL   Creatinine, Ser 1.15 0.61 - 1.24 mg/dL   Calcium 8.8 (L) 8.9 - 10.3 mg/dL   Total Protein 6.6 6.5 - 8.1 g/dL   Albumin 3.8 3.5 - 5.0 g/dL   AST 17 15 - 41 U/L   ALT 5 (L) 17 - 63 U/L   Alkaline Phosphatase 69 38 - 126 U/L   Total Bilirubin 0.6 0.3 - 1.2 mg/dL   GFR calc non Af Amer 57 (L) >60 mL/min   GFR calc Af Amer >60 >60 mL/min    Comment: (NOTE) The eGFR has been calculated using the CKD EPI equation. This calculation has not been validated in all clinical situations. eGFR's persistently <60 mL/min signify possible Chronic Kidney Disease.    Anion gap 5 5 - 15  Troponin I     Status: None   Collection Time: 11/28/15 10:15 AM  Result Value Ref Range   Troponin I <0.03 <0.031 ng/mL    Comment:        NO INDICATION OF MYOCARDIAL INJURY.   Glucose, capillary     Status: Abnormal   Collection Time: 11/28/15 10:23 AM  Result Value Ref Range   Glucose-Capillary 127 (H) 65 - 99 mg/dL  Urinalysis complete, with microscopic ([facility] only)     Status: Abnormal   Collection Time: 11/28/15  3:59 PM   Result Value Ref Range   Color, Urine YELLOW (A) YELLOW   APPearance CLEAR (A) CLEAR   Glucose, UA NEGATIVE NEGATIVE mg/dL   Bilirubin Urine NEGATIVE NEGATIVE   Ketones, ur TRACE (A) NEGATIVE mg/dL   Specific Gravity, Urine 1.015 1.005 - 1.030   Hgb urine dipstick 1+ (A) NEGATIVE   pH 6.0 5.0 - 8.0   Protein, ur NEGATIVE NEGATIVE mg/dL   Nitrite NEGATIVE NEGATIVE   Leukocytes, UA NEGATIVE NEGATIVE   RBC / HPF 0-5 0 - 5 RBC/hpf   WBC, UA 0-5 0 - 5 WBC/hpf   Bacteria, UA NONE SEEN NONE SEEN   Squamous Epithelial / LPF 0-5 (A) NONE SEEN   Mucous PRESENT    Hyaline Casts, UA PRESENT   Urine Drug Screen, Qualitative ([facility] only)     Status: Abnormal   Collection Time: 11/28/15  3:59 PM  Result Value Ref Range   Tricyclic, Ur Screen NONE DETECTED NONE DETECTED   Amphetamines, Ur Screen NONE DETECTED NONE DETECTED   MDMA (Ecstasy)Ur Screen POSITIVE (A) NONE DETECTED   Cocaine Metabolite,Ur [place] NONE DETECTED NONE DETECTED   Opiate, Ur Screen NONE DETECTED NONE DETECTED   Phencyclidine (PCP) Ur S NONE DETECTED NONE DETECTED   Cannabinoid 50 Ng, Ur  NONE DETECTED NONE DETECTED   Barbiturates, Ur Screen NONE DETECTED NONE DETECTED   Benzodiazepine, Ur Scrn POSITIVE (A) NONE DETECTED   Methadone Scn, Ur NONE DETECTED NONE DETECTED    Comment: (NOTE) 100  Tricyclics, urine               Cutoff 1000 ng/mL 200  Amphetamines, urine             Cutoff 1000 ng/mL 300  MDMA (Ecstasy), urine           Cutoff 500 ng/mL 400  Cocaine Metabolite, urine       Cutoff 300 ng/mL 500  Opiate, urine                   Cutoff   300 ng/mL 600  Phencyclidine (PCP), urine      Cutoff 25 ng/mL 700  Cannabinoid, urine              Cutoff 50 ng/mL 800  Barbiturates, urine             Cutoff 200 ng/mL 900  Benzodiazepine, urine           Cutoff 200 ng/mL 1000 Methadone, urine                Cutoff 300 ng/mL 1100 1200 The urine drug screen provides only a preliminary, unconfirmed 1300 analytical test result and  should not be used for non-medical 1400 purposes. Clinical consideration and professional judgment should 1500 be applied to any positive drug screen result due to possible 1600 interfering substances. A more specific alternate chemical method 1700 must be used in order to obtain a confirmed analytical result.  1800 Gas chromato graphy / mass spectrometry (GC/MS) is the preferred 1900 confirmatory method.   Basic metabolic panel     Status: Abnormal   Collection Time: 11/29/15  4:35 AM  Result Value Ref Range   Sodium 140 135 - 145 mmol/L   Potassium 3.6 3.5 - 5.1 mmol/L   Chloride 108 101 - 111 mmol/L   CO2 26 22 - 32 mmol/L   Glucose, Bld 95 65 - 99 mg/dL   BUN 19 6 - 20 mg/dL   Creatinine, Ser 1.26 (H) 0.61 - 1.24 mg/dL   Calcium 8.8 (L) 8.9 - 10.3 mg/dL   GFR calc non Af Amer 51 (L) >60 mL/min   GFR calc Af Amer 59 (L) >60 mL/min    Comment: (NOTE) The eGFR has been calculated using the CKD EPI equation. This calculation has not been validated in all clinical situations. eGFR's persistently <60 mL/min signify possible Chronic Kidney Disease.    Anion gap 6 5 - 15  Protime-INR     Status: Abnormal   Collection Time: 11/29/15  4:35 AM  Result Value Ref Range   Prothrombin Time 20.3 (H) 11.4 - 15.0 seconds   INR 1.74   CBC     Status: Abnormal   Collection Time: 11/29/15  4:35 AM  Result Value Ref Range   WBC 4.0 3.8 - 10.6 K/uL   RBC 2.97 (L) 4.40 - 5.90 MIL/uL   Hemoglobin 10.3 (L) 13.0 - 18.0 g/dL   HCT 30.5 (L) 40.0 - 52.0 %   MCV 102.8 (H) 80.0 - 100.0 fL   MCH 34.8 (H) 26.0 - 34.0 pg   MCHC 33.8 32.0 - 36.0 g/dL   RDW 13.4 11.5 - 14.5 %   Platelets 163 150 - 440 K/uL  Lipid panel     Status: None   Collection Time: 11/29/15  4:35 AM  Result Value Ref Range   Cholesterol 136 0 - 200 mg/dL   Triglycerides 59 <150 mg/dL   HDL 44 >40 mg/dL   Total CHOL/HDL Ratio 3.1 RATIO   VLDL 12 0 - 40 mg/dL   LDL Cholesterol 80 0 - 99 mg/dL    Comment:        Total  Cholesterol/HDL:CHD Risk Coronary Heart Disease Risk Table                     Men   Women  1/2 Average Risk   3.4   3.3  Average Risk       5.0   4.4  2 X Average Risk     9.6   7.1  3 X Average Risk  23.4   11.0        Use the calculated Patient Ratio above and the CHD Risk Table to determine the patient's CHD Risk.        ATP III CLASSIFICATION (LDL):  <100     mg/dL   Optimal  100-129  mg/dL   Near or Above                    Optimal  130-159  mg/dL   Borderline  160-189  mg/dL   High  >190     mg/dL   Very High     Ct Head Wo Contrast  11/28/2015  CLINICAL DATA:  Dizziness after fall.  Right facial droop EXAM: CT HEAD WITHOUT CONTRAST TECHNIQUE: Contiguous axial images were obtained from the base of the skull through the vertex without intravenous contrast. COMPARISON:  None. FINDINGS: There is age related volume loss. There is no intracranial mass, hemorrhage, extra-axial fluid collection, or midline shift. There is mild small vessel disease in the centra semiovale bilaterally. There is also small vessel disease in each internal capsule anteriorly. No acute infarct evident. Middle cerebral artery attenuation is symmetric and normal bilaterally. The bony calvarium appears intact. The mastoid air cells are clear. There is mild ethmoid sinus disease bilaterally. No intraorbital lesions are appreciable. IMPRESSION: Age related volume loss with mild supratentorial small vessel disease. No acute infarct evident. No mass or hemorrhage. Mild ethmoid sinus disease bilaterally. Critical Value/emergent results were called by telephone at the time of interpretation on 11/28/2015 at 10:02 am to Dr. DAVID SCHAEVITZ , who verbally acknowledged these results. Electronically Signed   By: William  Woodruff III M.D.   On: 11/28/2015 10:02   Mr Brain Wo Contrast  11/28/2015  CLINICAL DATA:  81-year-old male with atrial fibrillation, Parkinson's disease and prostate cancer presenting with dizziness post  fall hitting back of head. Sudden onset of bilateral leg weakness. No loss of consciousness. Right facial droop. Subsequent encounter. EXAM: MRI HEAD WITHOUT CONTRAST TECHNIQUE: Multiplanar, multiecho pulse sequences of the brain and surrounding structures were obtained without intravenous contrast. COMPARISON:  11/28/2015 head CT. FINDINGS: No acute infarct. No intracranial hemorrhage. Mild small vessel disease type changes. Global atrophy without hydrocephalus. No intracranial mass lesion noted on this unenhanced exam. Major intracranial vascular structures are patent. Post lens replacement otherwise orbital structures unremarkable. Ethmoid sinus air cell left maxillary sinus mucosal thickening. C3-4 bulge/protrusion with spinal stenosis and mild cord flattening. Facet degenerative changes greater on the right. Endplate reactive changes. Cervical medullary junction, pituitary region and pineal region unremarkable. IMPRESSION: Exam is motion degraded. No acute infarct or intracranial hemorrhage. Mild small vessel disease type changes. Global atrophy without hydrocephalus. No intracranial mass lesion noted on this unenhanced exam. Ethmoid sinus air cell and left maxillary sinus mucosal thickening. C3-4 bulge/protrusion with spinal stenosis and mild cord flattening. Facet degenerative changes greater on the right. Endplate reactive changes. Electronically Signed   By: Steven  Olson M.D.   On: 11/28/2015 13:04   Us Carotid Bilateral  11/28/2015  CLINICAL DATA:  CVA. EXAM: BILATERAL CAROTID DUPLEX ULTRASOUND TECHNIQUE: Gray scale imaging, color Doppler and duplex ultrasound were performed of bilateral carotid and vertebral arteries in the neck. COMPARISON:  None. FINDINGS: Criteria: Quantification of carotid stenosis is based on velocity parameters that correlate the residual internal carotid diameter with NASCET-based stenosis levels, using the diameter of the distal internal carotid lumen as the denominator for    stenosis measurement. The following velocity measurements were obtained: RIGHT ICA:  56 cm/sec CCA:  73 cm/sec SYSTOLIC ICA/CCA RATIO:  0.8 DIASTOLIC ICA/CCA RATIO:  0.9 ECA:  130 cm/sec LEFT ICA:  92 cm/sec CCA:  84 cm/sec SYSTOLIC ICA/CCA RATIO:  1.1 DIASTOLIC ICA/CCA RATIO:  0.9 ECA:  114 cm/sec RIGHT CAROTID ARTERY: Small amount of echogenic plaque at the right carotid bulb without significant stenosis. Right external carotid artery is patent. Right internal carotid artery is patent without significant plaque or stenosis. RIGHT VERTEBRAL ARTERY: Antegrade flow and normal waveform in the right vertebral artery. LEFT CAROTID ARTERY: Mild echogenic plaque at the left carotid bulb without significant stenosis. Echogenic plaque at the origin of the external carotid artery. Minimal plaque in the proximal internal carotid artery without significant stenosis. LEFT VERTEBRAL ARTERY: Antegrade flow and normal waveform in the left vertebral artery. IMPRESSION: Mild atherosclerotic disease in the carotid arteries, predominantly at the carotid bulbs. Estimated degree of stenosis in the internal carotid arteries is less than 50% bilaterally. Patent vertebral arteries. Electronically Signed   By: Adam  Henn M.D.   On: 11/28/2015 17:44    Review of Systems  Constitutional: Negative.   HENT: Negative.   Eyes: Negative.   Respiratory: Negative.   Cardiovascular: Negative.   Gastrointestinal: Negative.   Genitourinary: Negative.   Musculoskeletal: Negative.   Skin: Negative.   Neurological: Positive for tremors and loss of consciousness. Negative for dizziness, tingling, sensory change, speech change, focal weakness and seizures.  Psychiatric/Behavioral: Negative.    Blood pressure 112/62, pulse 52, temperature 97.6 F (36.4 C), temperature source Oral, resp. rate 16, height 5' 7" (1.702 m), weight 78.427 kg (172 lb 14.4 oz), SpO2 97 %. Physical Exam  Nursing note and vitals reviewed. Constitutional: He appears  well-developed and well-nourished. No distress.  HENT:  Head: Normocephalic and atraumatic.  Right Ear: External ear normal.  Left Ear: External ear normal.  Nose: Nose normal.  Mouth/Throat: Oropharynx is clear and moist.  Eyes: Conjunctivae and EOM are normal. Pupils are equal, round, and reactive to light. No scleral icterus.  Neck: Normal range of motion. Neck supple.  Cardiovascular: Normal rate, regular rhythm, normal heart sounds and intact distal pulses.   No murmur heard. Respiratory: Effort normal and breath sounds normal. No respiratory distress.  GI: Soft. Bowel sounds are normal. He exhibits no distension.  Musculoskeletal: Normal range of motion. He exhibits no edema.  Neurological:  A+Ox4, nl speech and language but some bradyphrenia PERRLA, EOMI, nl VF, face symmetric with hypomimia, tongue midline 5/5 B, moderate bradykinesia, moderate tone increase, no tremor FTN WNL, stupped gait that is mildly widebased 1+/4 B, mute plantars B Nl temp and vibration  Skin: Skin is warm. He is not diaphoretic.  Psychiatric: He has a normal mood and affect.   CT of head personally reviewed by me and shows mild white matter changes  Assessment/Plan: 1.  Syncope-  This is common with Parkinsons but could be micturation related as well;  Would not aggressively treat unless episodes become more recurrent 2.  Parkinsons-  Not quite controlled -  Add Sinemet CR 50/200 qhs -  Continue Sinemet 25/100 1.5 tabs at 7am, 11am, 3pm and 7 pm -  Will sign off, please call with questions -  Needs to f/u with [facility] in 3 months  ,  11/29/2015, 12:22 PM      

## 2015-11-29 NOTE — Evaluation (Signed)
Physical Therapy Evaluation Patient Details Name: [name] [name] MRN: [id number] DOB: [birth year] Today's Date: 11/29/2015   History of Present Illness  Pt admitted for possible CVA. Pt complains of fall during ambulation from bathroom. Pt with history of Afib, HTN, and Parkinsons disease. Family reports wife passed away 2 months ago and now he currently lives alone at [facility].  Clinical Impression  Pt is a pleasant 81 year old male who admitted for fall and possible CVA. No strength/sensation/coordination deficits noted at this time. Pt demonstrates all bed mobility/transfers/ambulation at baseline level. Pt does not require any further PT needs at this time. Recommend to use rw initially at home to prevent falls. Pt completed 5 time sit<>stand in 13 seconds demonstrating good power. Pt will be dc in house and does not require follow up. RN aware. Will dc current orders.     Follow Up Recommendations No PT follow up    Equipment Recommendations       Recommendations for Other Services       Precautions / Restrictions Precautions Precautions: Fall Restrictions Weight Bearing Restrictions: No      Mobility  Bed Mobility Overal bed mobility: Independent             General bed mobility comments: safe technique performed  Transfers Overall transfer level: Modified independent Equipment used: Rolling walker (2 wheeled)             General transfer comment: sit<>Stand with safe technique.   Ambulation/Gait Ambulation/Gait assistance: Min guard Ambulation Distance (Feet): 190 Feet Assistive device: Rolling walker (2 wheeled) Gait Pattern/deviations: Step-through pattern     General Gait Details: ambulated using safe technique and reciprocal gait pattern. Good gait speed noted  Stairs            Wheelchair Mobility    Modified Rankin (Stroke Patients Only)       Balance Overall balance assessment: History of Falls;Needs  assistance Sitting-balance support: No upper extremity supported;Feet supported Sitting balance-Leahy Scale: Normal     Standing balance support: Bilateral upper extremity supported Standing balance-Leahy Scale: Good                               Pertinent Vitals/Pain Pain Assessment: No/denies pain    Home Living Family/patient expects to be discharged to:: Private residence Living Arrangements:  (Indep Living facility) Available Help at Discharge:  (ILF) Type of Home: Independent living facility Home Access: Level entry       Home Equipment: Walker - 2 wheels;Cane - single point      Prior Function Level of Independence: Independent         Comments: has SPC and RW that he occasionally uses     Hand Dominance        Extremity/Trunk Assessment   Upper Extremity Assessment: Overall WFL for tasks assessed           Lower Extremity Assessment: Overall WFL for tasks assessed         Communication   Communication: No difficulties  Cognition Arousal/Alertness: Awake/alert Behavior During Therapy: WFL for tasks assessed/performed Overall Cognitive Status: Within Functional Limits for tasks assessed                      General Comments      Exercises        Assessment/Plan    PT Assessment Patent does not need any further PT services  PT  Diagnosis Difficulty walking;Abnormality of gait   PT Problem List    PT Treatment Interventions     PT Goals (Current goals can be found in the Care Plan section) Acute Rehab PT Goals Patient Stated Goal: to go home PT Goal Formulation: With patient Time For Goal Achievement: 11/29/15 Potential to Achieve Goals: Good    Frequency     Barriers to discharge        Co-evaluation               End of Session Equipment Utilized During Treatment: Gait belt Activity Tolerance: Patient tolerated treatment well Patient left: in chair;with chair alarm set Nurse Communication:  Mobility status    Functional Assessment Tool Used: 5 time sit<>Stand Functional Limitation: Mobility: Walking and moving around Mobility: Walking and Moving Around Current Status [phone]): 0 percent impaired, limited or restricted Mobility: Walking and Moving Around Goal Status [phone]): 0 percent impaired, limited or restricted Mobility: Walking and Moving Around Discharge Status [phone]): 0 percent impaired, limited or restricted    Time: 1342-1400 PT Time Calculation (min) (ACUTE ONLY): 18 min   Charges:   PT Evaluation $Initial PT Evaluation Tier I: 1 Procedure     PT G Codes:   PT G-Codes **NOT FOR INPATIENT CLASS** Functional Assessment Tool Used: 5 time sit<>Stand Functional Limitation: Mobility: Walking and moving around Mobility: Walking and Moving Around Current Status [id number]): 0 percent impaired, limited or restricted Mobility: Walking and Moving Around Goal Status [phone]): 0 percent impaired, limited or restricted Mobility: Walking and Moving Around Discharge Status [phone]): 0 percent impaired, limited or restricted    Glennice Marcos 11/29/2015, 3:08 PM  Greggory Stallion, PT, DPT [phone]

## 2015-11-29 NOTE — Consult Note (Signed)
[facility] Face-to-Face Psychiatry Consult   Reason for Consult:  Consult for this 81 year old gentleman currently in the hospital because of a fall and concern about possible stroke symptoms. Consult regarding mood and anxiety primarily Referring Physician:  Anselm Jungling  Patient Identification: [name] [name] MRN:  [id number] Principal Diagnosis: Depression, major, single episode, mild ([place]) Diagnosis:   Patient Active Problem List   Diagnosis Date Noted  . Depression, major, single episode, mild ([place]) [F32.0] 11/29/2015  . Alzheimer's disease [G30.9] 11/29/2015  . CVA (cerebral infarction) [I63.9] 11/28/2015    Total Time spent with patient: 1 hour  Subjective:   [name] [name] is a 81 y.o. male patient admitted with "I wanted to talk with you because I have not been feeling very good".  HPI:  Information from the patient and the chart. Also from the patient's daughter. I had a long conversation with this patient and reviewed his current and past chart. Case discussed with hospitalist with nursing and with the patient's daughter. This 68 year old gentleman is currently in the hospital because of a fall which was followed by concerns about possible stroke symptoms. During his hospital stay he brought her to the attention of the treatment team that he was having some symptoms of depression and anxiety. Patient states that he does feel down and sad more often than not and this is been going on for about a month. His sleep is been chaotic. He stays up too late and then sleeps too much during the day. His appetite has been for and he has lost some weight. He denies that he is having any suicidal thoughts whatsoever or thought of wanting to "give up". He is still able to identify multiple positive things that he enjoys about his life. Additionally he seems to be ruminating a great deal about things that happened earlier in his life and is spending more time thinking about negative memories then positive  memories. Other symptoms include visual hallucinations. He has been seeing his recently deceased wife on a regular basis but also describes what are probably hypnagogic hallucinations seeing birds in his apartment when he wakes up in the middle of the night. Obvious recent stress is that his wife of 27 years died 22 month ago. Patient has Parkinson's disease and is on medication for that. Also several other medical problems. He also describes a lot of rumination about his life with some regrets not because he thinks that he had a bad life but because he misses his previous strength and excitement in his life. Patient is not currently taking any medication for depression or for memory impairment. He and especially his daughter have noticed more frequent memory impairment recently.  Social history: [facility] lives at the [facility] [place]. Sounds like he has some assistance in doing his housekeeping and is starting to get some assistance with medication management. Wife of 30 years died just a month ago. He has 2 adult children and has grandchildren as well. Sounds like they keep a good eye on him. 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  Medical history: Patient has atrial fibrillation and is on chronic anticoagulation. He has Parkinson's disease and is on medication for that. He also is on some medicine for his prostate and a history of multiple kidney stones.  Substance abuse history: Does not drink does not use drugs no history of substance abuse  Past Psychiatric History: Patient states he's never been in a psychiatric hospital never been  seen by a psychiatrist before. No history of suicide attempts. He does however reveal eventually that he is on chronic Librium. It sounds like he's been prescribed 10 mg of Librium on a regular basis for years which was started for nerves and the dose has never been adjusted.  Risk to Self: Is patient at risk for  suicide?: No Risk to Others:   Prior Inpatient Therapy:   Prior Outpatient Therapy:    Past Medical History:  Past Medical History  Diagnosis Date  . Atrial fibrillation ([place])   . Parkinson's disease ([place])   . CHF (congestive heart failure) ([place])   . Prostate cancer ([place])   . Vertigo   . Psoriasis    No past surgical history on file. Family History:  Family History  Problem Relation Age of Onset  . Hypertension Mother    Family Psychiatric  History: Patient could only remember that he had an aunt who had some kind of mental health problems but the daughter states that all of the extended immediate family have chronic anxiety symptoms as including her. Social History:  History  Alcohol Use: Not on file     History  Drug Use Not on file    Social History   Social History  . Marital Status: Married    Spouse Name: N/A  . Number of Children: N/A  . Years of Education: N/A   Social History Main Topics  . Smoking status: Never Smoker   . Smokeless tobacco: None  . Alcohol Use: None  . Drug Use: None  . Sexual Activity: Not Asked   Other Topics Concern  . None   Social History Narrative   Additional Social History:                          Allergies:   Allergies  Allergen Reactions  . Penicillins     Labs:  Results for orders placed or performed during the hospital encounter of 11/28/15 (from the past 48 hour(s))  Ethanol     Status: None   Collection Time: 11/28/15 10:15 AM  Result Value Ref Range   Alcohol, Ethyl (B) <5 <5 mg/dL    Comment:        LOWEST DETECTABLE LIMIT FOR SERUM ALCOHOL IS 5 mg/dL FOR MEDICAL PURPOSES ONLY   Protime-INR     Status: Abnormal   Collection Time: 11/28/15 10:15 AM  Result Value Ref Range   Prothrombin Time 20.0 (H) 11.4 - 15.0 seconds   INR 1.70   APTT     Status: None   Collection Time: 11/28/15 10:15 AM  Result Value Ref Range   aPTT 34 24 - 36 seconds  CBC     Status: Abnormal   Collection Time:  11/28/15 10:15 AM  Result Value Ref Range   WBC 3.4 (L) 3.8 - 10.6 K/uL   RBC 3.03 (L) 4.40 - 5.90 MIL/uL   Hemoglobin 10.4 (L) 13.0 - 18.0 g/dL   HCT 31.2 (L) 40.0 - 52.0 %   MCV 103.3 (H) 80.0 - 100.0 fL   MCH 34.5 (H) 26.0 - 34.0 pg   MCHC 33.4 32.0 - 36.0 g/dL   RDW 13.4 11.5 - 14.5 %   Platelets 170 150 - 440 K/uL  Differential     Status: Abnormal   Collection Time: 11/28/15 10:15 AM  Result Value Ref Range   Neutrophils Relative % 70 %   Neutro Abs 2.4 1.4 - 6.5 K/uL  Lymphocytes Relative 16 %   Lymphs Abs 0.5 (L) 1.0 - 3.6 K/uL   Monocytes Relative 11 %   Monocytes Absolute 0.4 0.2 - 1.0 K/uL   Eosinophils Relative 2 %   Eosinophils Absolute 0.1 0 - 0.7 K/uL   Basophils Relative 1 %   Basophils Absolute 0.0 0 - 0.1 K/uL  Comprehensive metabolic panel     Status: Abnormal   Collection Time: 11/28/15 10:15 AM  Result Value Ref Range   Sodium 137 135 - 145 mmol/L   Potassium 3.9 3.5 - 5.1 mmol/L   Chloride 106 101 - 111 mmol/L   CO2 26 22 - 32 mmol/L   Glucose, Bld 121 (H) 65 - 99 mg/dL   BUN 21 (H) 6 - 20 mg/dL   Creatinine, Ser 1.15 0.61 - 1.24 mg/dL   Calcium 8.8 (L) 8.9 - 10.3 mg/dL   Total Protein 6.6 6.5 - 8.1 g/dL   Albumin 3.8 3.5 - 5.0 g/dL   AST 17 15 - 41 U/L   ALT 5 (L) 17 - 63 U/L   Alkaline Phosphatase 69 38 - 126 U/L   Total Bilirubin 0.6 0.3 - 1.2 mg/dL   GFR calc non Af Amer 57 (L) >60 mL/min   GFR calc Af Amer >60 >60 mL/min    Comment: (NOTE) The eGFR has been calculated using the CKD EPI equation. This calculation has not been validated in all clinical situations. eGFR's persistently <60 mL/min signify possible Chronic Kidney Disease.    Anion gap 5 5 - 15  Troponin I     Status: None   Collection Time: 11/28/15 10:15 AM  Result Value Ref Range   Troponin I <0.03 <0.031 ng/mL    Comment:        NO INDICATION OF MYOCARDIAL INJURY.   Glucose, capillary     Status: Abnormal   Collection Time: 11/28/15 10:23 AM  Result Value Ref Range    Glucose-Capillary 127 (H) 65 - 99 mg/dL  Urinalysis complete, with microscopic ([facility] only)     Status: Abnormal   Collection Time: 11/28/15  3:59 PM  Result Value Ref Range   Color, Urine YELLOW (A) YELLOW   APPearance CLEAR (A) CLEAR   Glucose, UA NEGATIVE NEGATIVE mg/dL   Bilirubin Urine NEGATIVE NEGATIVE   Ketones, ur TRACE (A) NEGATIVE mg/dL   Specific Gravity, Urine 1.015 1.005 - 1.030   Hgb urine dipstick 1+ (A) NEGATIVE   pH 6.0 5.0 - 8.0   Protein, ur NEGATIVE NEGATIVE mg/dL   Nitrite NEGATIVE NEGATIVE   Leukocytes, UA NEGATIVE NEGATIVE   RBC / HPF 0-5 0 - 5 RBC/hpf   WBC, UA 0-5 0 - 5 WBC/hpf   Bacteria, UA NONE SEEN NONE SEEN   Squamous Epithelial / LPF 0-5 (A) NONE SEEN   Mucous PRESENT    Hyaline Casts, UA PRESENT   Urine Drug Screen, Qualitative ([facility] only)     Status: Abnormal   Collection Time: 11/28/15  3:59 PM  Result Value Ref Range   Tricyclic, Ur Screen NONE DETECTED NONE DETECTED   Amphetamines, Ur Screen NONE DETECTED NONE DETECTED   MDMA (Ecstasy)Ur Screen POSITIVE (A) NONE DETECTED   Cocaine Metabolite,Ur [place] NONE DETECTED NONE DETECTED   Opiate, Ur Screen NONE DETECTED NONE DETECTED   Phencyclidine (PCP) Ur S NONE DETECTED NONE DETECTED   Cannabinoid 50 Ng, Ur [place] NONE DETECTED NONE DETECTED   Barbiturates, Ur Screen NONE DETECTED NONE DETECTED   Benzodiazepine, Ur Scrn POSITIVE (A)  NONE DETECTED   Methadone Scn, Ur NONE DETECTED NONE DETECTED    Comment: (NOTE) 867  Tricyclics, urine               Cutoff 1000 ng/mL 200  Amphetamines, urine             Cutoff 1000 ng/mL 300  MDMA (Ecstasy), urine           Cutoff 500 ng/mL 400  Cocaine Metabolite, urine       Cutoff 300 ng/mL 500  Opiate, urine                   Cutoff 300 ng/mL 600  Phencyclidine (PCP), urine      Cutoff 25 ng/mL 700  Cannabinoid, urine              Cutoff 50 ng/mL 800  Barbiturates, urine             Cutoff 200 ng/mL 900  Benzodiazepine, urine           Cutoff 200 ng/mL 1000  Methadone, urine                Cutoff 300 ng/mL 1100 1200 The urine drug screen provides only a preliminary, unconfirmed 1300 analytical test result and should not be used for non-medical 1400 purposes. Clinical consideration and professional judgment should 1500 be applied to any positive drug screen result due to possible 1600 interfering substances. A more specific alternate chemical method 1700 must be used in order to obtain a confirmed analytical result.  1800 Gas chromato graphy / mass spectrometry (GC/MS) is the preferred 1900 confirmatory method.   Basic metabolic panel     Status: Abnormal   Collection Time: 11/29/15  4:35 AM  Result Value Ref Range   Sodium 140 135 - 145 mmol/L   Potassium 3.6 3.5 - 5.1 mmol/L   Chloride 108 101 - 111 mmol/L   CO2 26 22 - 32 mmol/L   Glucose, Bld 95 65 - 99 mg/dL   BUN 19 6 - 20 mg/dL   Creatinine, Ser 1.26 (H) 0.61 - 1.24 mg/dL   Calcium 8.8 (L) 8.9 - 10.3 mg/dL   GFR calc non Af Amer 51 (L) >60 mL/min   GFR calc Af Amer 59 (L) >60 mL/min    Comment: (NOTE) The eGFR has been calculated using the CKD EPI equation. This calculation has not been validated in all clinical situations. eGFR's persistently <60 mL/min signify possible Chronic Kidney Disease.    Anion gap 6 5 - 15  Protime-INR     Status: Abnormal   Collection Time: 11/29/15  4:35 AM  Result Value Ref Range   Prothrombin Time 20.3 (H) 11.4 - 15.0 seconds   INR 1.74   CBC     Status: Abnormal   Collection Time: 11/29/15  4:35 AM  Result Value Ref Range   WBC 4.0 3.8 - 10.6 K/uL   RBC 2.97 (L) 4.40 - 5.90 MIL/uL   Hemoglobin 10.3 (L) 13.0 - 18.0 g/dL   HCT 30.5 (L) 40.0 - 52.0 %   MCV 102.8 (H) 80.0 - 100.0 fL   MCH 34.8 (H) 26.0 - 34.0 pg   MCHC 33.8 32.0 - 36.0 g/dL   RDW 13.4 11.5 - 14.5 %   Platelets 163 150 - 440 K/uL  Lipid panel     Status: None   Collection Time: 11/29/15  4:35 AM  Result Value Ref Range   Cholesterol 136 0 -  200 mg/dL   Triglycerides 59  <150 mg/dL   HDL 44 >40 mg/dL   Total CHOL/HDL Ratio 3.1 RATIO   VLDL 12 0 - 40 mg/dL   LDL Cholesterol 80 0 - 99 mg/dL    Comment:        Total Cholesterol/HDL:CHD Risk Coronary Heart Disease Risk Table                     Men   Women  1/2 Average Risk   3.4   3.3  Average Risk       5.0   4.4  2 X Average Risk   9.6   7.1  3 X Average Risk  23.4   11.0        Use the calculated Patient Ratio above and the CHD Risk Table to determine the patient's CHD Risk.        ATP III CLASSIFICATION (LDL):  <100     mg/dL   Optimal  100-129  mg/dL   Near or Above                    Optimal  130-159  mg/dL   Borderline  160-189  mg/dL   High  >190     mg/dL   Very High     Current Facility-Administered Medications  Medication Dose Route Frequency Provider Last Rate Last Dose  . 0.9 %  sodium chloride infusion  250 mL Intravenous PRN Hillary Bow, MD      . acetaminophen (TYLENOL) tablet 650 mg  650 mg Oral Q6H PRN Hillary Bow, MD       Or  . acetaminophen (TYLENOL) suppository 650 mg  650 mg Rectal Q6H PRN Srikar Sudini, MD      . albuterol (PROVENTIL) (2.5 MG/3ML) 0.083% nebulizer solution 2.5 mg  2.5 mg Nebulization Q2H PRN Srikar Sudini, MD      . alfuzosin (UROXATRAL) 24 hr tablet 10 mg  10 mg Oral Daily Hillary Bow, MD   10 mg at 11/29/15 0802  . atorvastatin (LIPITOR) tablet 40 mg  40 mg Oral q1800 Hillary Bow, MD   40 mg at 11/28/15 1820  . carbidopa-levodopa (SINEMET CR) 50-200 MG per tablet controlled release 1 tablet  1 tablet Oral QHS Valora Corporal, MD      . carbidopa-levodopa (SINEMET IR) 25-100 MG per tablet immediate release 1.5 tablet  1.5 tablet Oral QID Hillary Bow, MD   1.5 tablet at 11/29/15 1329  . clonazePAM (KLONOPIN) tablet 0.5 mg  0.5 mg Oral QHS Srikar Sudini, MD   0.5 mg at 11/28/15 2146  . fenofibrate tablet 160 mg  160 mg Oral QPM Srikar Sudini, MD   160 mg at 11/28/15 1821  . folic acid (FOLVITE) tablet 1 mg  1 mg Oral Daily Hillary Bow, MD   1 mg  at 11/29/15 0802  . HYDROcodone-acetaminophen (NORCO/VICODIN) 5-325 MG per tablet 1-2 tablet  1-2 tablet Oral Q4H PRN Srikar Sudini, MD      . methotrexate (RHEUMATREX) tablet 15 mg  15 mg Oral Weekly Vaughan Basta, MD   15 mg at 11/29/15 1543  . ondansetron (ZOFRAN) tablet 4 mg  4 mg Oral Q6H PRN Hillary Bow, MD       Or  . ondansetron (ZOFRAN) injection 4 mg  4 mg Intravenous Q6H PRN Srikar Sudini, MD      . predniSONE (DELTASONE) tablet 2.5 mg  2.5 mg Oral Q breakfast Hillary Bow, MD  2.5 mg at 11/29/15 0802  . rOPINIRole (REQUIP) tablet 5 mg  5 mg Oral QID Hillary Bow, MD   5 mg at 11/29/15 1543  . senna-docusate (Senokot-S) tablet 1 tablet  1 tablet Oral QHS PRN Srikar Sudini, MD      . sodium chloride 0.9 % injection 3 mL  3 mL Intravenous Q12H Hillary Bow, MD   3 mL at 11/29/15 0804  . warfarin (COUMADIN) tablet 2.5 mg  2.5 mg Oral Q M,W,F,Sa-1800 Srikar Sudini, MD      . Derrill Memo ON 11/30/2015] warfarin (COUMADIN) tablet 5 mg  5 mg Oral Once per day on Sun Tue Thu Loleta Dicker, [place]      . Warfarin - Physician Dosing Inpatient   Does not apply q1800 Hillary Bow, MD        Musculoskeletal: Strength & Muscle Tone: decreased Gait & Station: ataxic Patient leans: Front  Psychiatric Specialty Exam: Review of Systems  Constitutional: Positive for weight loss.  HENT: Negative.   Eyes: Positive for double vision.  Respiratory: Negative.   Cardiovascular: Negative.   Gastrointestinal: Negative.   Musculoskeletal: Negative.   Skin: Negative.   Neurological: Positive for weakness.  Endo/Heme/Allergies: Bruises/bleeds easily.  Psychiatric/Behavioral: Positive for depression, hallucinations and memory loss. Negative for suicidal ideas and substance abuse. The patient is nervous/anxious and has insomnia.     Blood pressure 124/62, pulse 64, temperature 97.6 F (36.4 C), temperature source Oral, resp. rate 19, height '5\' 7"'  (1.702 m), weight 78.427 kg (172 lb 14.4 oz),  SpO2 95 %.Body mass index is 27.07 kg/(m^2).  General Appearance: Casual  Eye Contact::  Good  Speech:  Slow  Volume:  Normal  Mood:  Dysphoric  Affect:  Congruent  Thought Process:  Circumstantial and Tangential  Orientation:  Full (Time, Place, and Person)  Thought Content:  Hallucinations: Visual  Suicidal Thoughts:  No  Homicidal Thoughts:  No  Memory:  Immediate;   Good Recent;   Poor Remote;   Good  Judgement:  Fair  Insight:  Fair  Psychomotor Activity:  Decreased  Concentration:  Fair  Recall:  [employer] of Knowledge:Fair  Language: Fair  Akathisia:  No  Handed:  Right  AIMS (if indicated):     Assets:  Communication Skills Desire for Improvement Financial Resources/Insurance Housing Intimacy Leisure Time Resilience Social Support  ADL's:  Intact  Cognition: Impaired,  Mild and Moderate  Sleep:      Treatment Plan Summary: Medication management and Plan This is an 81 year old man who has some concerns about anxiety and depression. He has had a recent severe trauma with the death of his wife. Under the circumstances it is to be expected that he will have some symptoms that could represent depression. It sounds like these are prominent enough and are impairing enough to him that he would be reasonable to at least try medication treatment. I am going to recommend 10 mg of citalopram be started for mood and anxiety. Additionally I note that his short-term memory is quite bad. He could not remember any of 3 words at 3 minutes. He made several of the kinds of mistakes during our conversation typical of having Alzheimer's disease. I think this is exactly the kind of gentleman who should be on medicine to slow down and memory loss. I will start him on Namenda 5 mg per day. I have counseled his daughter that both of these medicines could be gradually titrated up if they are tolerated. Acknowledge the potential  for side effects but I think that in both cases there is quite a bit  of potential benefit to be gained. I encouraged the patient and his daughter to make sure they keep an eye on his symptoms and if things are getting worse to bring it to the attention of his outpatient physician. I reassured him that having visual hallucinations of his wife and even the hypnagogic hallucinations are actually considered normal. I don't think there would be any reason to start an antipsychotic medicine. Patient and daughter expressed understanding. Orders placed. Prescriptions written. Case reviewed with nursing.  Disposition: Patient does not meet criteria for psychiatric inpatient admission. Supportive therapy provided about ongoing stressors.  John Clapacs 11/29/2015 5:13 PM

## 2015-11-29 NOTE — Discharge Summary (Signed)
[facility] [place] NAME: [name] [name]    MR#:  [id number]  DATE OF BIRTH:  [birth year]  DATE OF ADMISSION:  11/28/2015 ADMITTING PHYSICIAN: Hillary Bow, MD  DATE OF DISCHARGE: 11/29/2015  PRIMARY CARE PHYSICIAN: No primary care provider on file.    ADMISSION DIAGNOSIS:  CVA (cerebral infarction) [I63.9] Cerebral infarction due to unspecified mechanism [I63.9]  DISCHARGE DIAGNOSIS:  Active Problems:   CVA (cerebral infarction)   SECONDARY DIAGNOSIS:   Past Medical History  Diagnosis Date  . Atrial fibrillation ([place])   . Parkinson's disease ([place])   . CHF (congestive heart failure) ([place])   . Prostate cancer ([place])   . Vertigo   . Psoriasis     HOSPITAL COURSE:   * Acute CVA with right-sided facial droop suspected on admission,. Admitted to medical bed. Patient has history of atrial fibrillation and is on Coumadin. Check echocardiogram, carotid Doppler, MRI- negative for acute stroke. Neuro checks. Improved. Appreciated consult neurology.  * A. fib, rate control Continue Medication  * Hypertension Hold medications for permissive hypertension  resume on discharge.  * Dementia Patient's wife passed away 2 months prior as per nurse from [place]. Patient has been slowly worsening with his mental status since then.  * Alcohol abuse Spoke with Nurse at [facility] and patient is likely drinking alcohol daily. Not confirmed. Start CIWA if any withdrawals noticed.  no withdrawal signs.  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   I called psych consult- may need psychotherapy.  DISCHARGE CONDITIONS:   Stable.  CONSULTS OBTAINED:  Treatment Team:  Gonzella Lex, MD  DRUG ALLERGIES:   Allergies  Allergen Reactions  . Penicillins     DISCHARGE  MEDICATIONS:   Current Discharge Medication List    START taking these medications   Details  atorvastatin (LIPITOR) 40 MG tablet Take 1 tablet (40 mg total) by mouth daily at 6 PM. Qty: 30 tablet, Refills: 0    carbidopa-levodopa (SINEMET CR) 50-200 MG tablet Take 1 tablet by mouth at bedtime. Qty: 30 tablet, Refills: 0      CONTINUE these medications which have CHANGED   Details  carbidopa-levodopa (SINEMET IR) 25-100 MG tablet Take 1.5 tablets by mouth 4 (four) times daily. Qty: 100 tablet, Refills: 0      CONTINUE these medications which have NOT CHANGED   Details  acetaminophen (TYLENOL) 500 MG tablet Take 500 mg by mouth 2 (two) times daily.    alfuzosin (UROXATRAL) 10 MG 24 hr tablet Take 10 mg by mouth daily.    chlordiazePOXIDE (LIBRIUM) 10 MG capsule Take 10 mg by mouth daily.     cholecalciferol (VITAMIN D) 1000 UNITS tablet Take 1,000 Units by mouth daily.    clonazePAM (KLONOPIN) 1 MG tablet Take 1 mg by mouth every evening.    fenofibrate (TRICOR) 145 MG tablet Take 145 mg by mouth daily.    folic acid (FOLVITE) 1 MG tablet Take 1 mg by mouth daily.    hydrochlorothiazide (MICROZIDE) 12.5 MG capsule Take 12.5 mg by mouth 3 (three) times a week.    Melatonin 3 MG CAPS Take 3 mg by mouth every evening.    methotrexate 2.5 MG tablet Take 12.5 mg by mouth once a week. On Wednesday with a meal  multivitamin-iron-minerals-folic acid (THERAPEUTIC-M) TABS tablet Take 1 tablet by mouth daily.    predniSONE (DELTASONE) 2.5 MG tablet Take 2.5 mg by mouth daily with breakfast.    ropinirole (REQUIP) 5 MG tablet Take 5 mg by mouth QID.    vitamin B-12 (CYANOCOBALAMIN) 1000 MCG tablet Take 1,000 mcg by mouth daily.    !! warfarin (COUMADIN) 2.5 MG tablet Take 2.5 mg by mouth daily. On Monday, Wednesday, Friday, Saturday    !! warfarin (COUMADIN) 5 MG tablet Take 5 mg by mouth daily. On Sunday, Tuesday, and Thursday at 6pm     !! - Potential duplicate medications  found. Please discuss with provider.       DISCHARGE INSTRUCTIONS:    Follow with PMD in office.  If you experience worsening of your admission symptoms, develop shortness of breath, life threatening emergency, suicidal or homicidal thoughts you must seek medical attention immediately by calling 911 or calling your MD immediately  if symptoms less severe.  You Must read complete instructions/literature along with all the possible adverse reactions/side effects for all the Medicines you take and that have been prescribed to you. Take any new Medicines after you have completely understood and accept all the possible adverse reactions/side effects.   Please note  You were cared for by a hospitalist during your hospital stay. If you have any questions about your discharge medications or the care you received while you were in the hospital after you are discharged, you can call the unit and asked to speak with the hospitalist on call if the hospitalist that took care of you is not available. Once you are discharged, your primary care physician will handle any further medical issues. Please note that NO REFILLS for any discharge medications will be authorized once you are discharged, as it is imperative that you return to your primary care physician (or establish a relationship with a primary care physician if you do not have one) for your aftercare needs so that they can reassess your need for medications and monitor your lab values.    Today   CHIEF COMPLAINT:   Chief Complaint  Patient presents with  . Fall  . Near Syncope    HISTORY OF PRESENT ILLNESS:  [name] [name]  is a 81 y.o. male with a known history of A. fib, hypertension, psoriasis who is a resident of independent living presents to the hospital after he had a fall earlier today. He got acutely dizzy trying to reach for something on the wall and fell backwards hitting his head. No trauma. No loss of consciousness. Patient was  noticed to have new right-sided facial droop and brought to the emergency room. Here CT scan of the head showed nothing acute. Was seen by tele neurology and recommended admission. Patient has no complaints of any dysphagia, change in vision, focal weakness or numbness.   VITAL SIGNS:  Blood pressure 124/62, pulse 64, temperature 97.6 F (36.4 C), temperature source Oral, resp. rate 19, height 5\' 7"  (1.702 m), weight 78.427 kg (172 lb 14.4 oz), SpO2 95 %.  I/O:   Intake/Output Summary (Last 24 hours) at 11/29/15 1515 Last data filed at 11/29/15 1315  Gross per 24 hour  Intake    240 ml  Output   1100 ml  Net   -860 ml    PHYSICAL EXAMINATION:  GENERAL:  81 y.o.-year-old patient lying in the bed with no acute distress.  EYES: Pupils equal, round, reactive to light and accommodation. No scleral icterus. Extraocular  muscles intact.  HEENT: Head atraumatic, normocephalic. Oropharynx and nasopharynx clear.  NECK:  Supple, no jugular venous distention. No thyroid enlargement, no tenderness.  LUNGS: Normal breath sounds bilaterally, no wheezing, rales,rhonchi or crepitation. No use of accessory muscles of respiration.  CARDIOVASCULAR: S1, S2 normal. No murmurs, rubs, or gallops.  ABDOMEN: Soft, non-tender, non-distended. Bowel sounds present. No organomegaly or mass.  EXTREMITIES: No pedal edema, cyanosis, or clubbing.  NEUROLOGIC: Cranial nerves II through XII are intact. Muscle strength 5/5 in all extremities. Sensation intact. Gait not checked.  PSYCHIATRIC: The patient is alert and oriented x 3.  SKIN: No obvious rash, lesion, or ulcer.   DATA REVIEW:   CBC  Recent Labs Lab 11/29/15 0435  WBC 4.0  HGB 10.3*  HCT 30.5*  PLT 163    Chemistries   Recent Labs Lab 11/28/15 1015 11/29/15 0435  NA 137 140  K 3.9 3.6  CL 106 108  CO2 26 26  GLUCOSE 121* 95  BUN 21* 19  CREATININE 1.15 1.26*  CALCIUM 8.8* 8.8*  AST 17  --   ALT 5*  --   ALKPHOS 69  --   BILITOT 0.6  --      Cardiac Enzymes  Recent Labs Lab 11/28/15 1015  TROPONINI <0.03    Microbiology Results  No results found for this or any previous visit.  RADIOLOGY:  Ct Head Wo Contrast  11/28/2015  CLINICAL DATA:  Dizziness after fall.  Right facial droop EXAM: CT HEAD WITHOUT CONTRAST TECHNIQUE: Contiguous axial images were obtained from the base of the skull through the vertex without intravenous contrast. COMPARISON:  None. FINDINGS: There is age related volume loss. There is no intracranial mass, hemorrhage, extra-axial fluid collection, or midline shift. There is mild small vessel disease in the centra semiovale bilaterally. There is also small vessel disease in each internal capsule anteriorly. No acute infarct evident. Middle cerebral artery attenuation is symmetric and normal bilaterally. The bony calvarium appears intact. The mastoid air cells are clear. There is mild ethmoid sinus disease bilaterally. No intraorbital lesions are appreciable. IMPRESSION: Age related volume loss with mild supratentorial small vessel disease. No acute infarct evident. No mass or hemorrhage. Mild ethmoid sinus disease bilaterally. Critical Value/emergent results were called by telephone at the time of interpretation on 11/28/2015 at 10:02 am to Dr. Larae Grooms , who verbally acknowledged these results. Electronically Signed   By: Lowella Grip III M.D.   On: 11/28/2015 10:02   Mr Brain Wo Contrast  11/28/2015  CLINICAL DATA:  81 year old male with atrial fibrillation, Parkinson's disease and prostate cancer presenting with dizziness post fall hitting back of head. Sudden onset of bilateral leg weakness. No loss of consciousness. Right facial droop. Subsequent encounter. EXAM: MRI HEAD WITHOUT CONTRAST TECHNIQUE: Multiplanar, multiecho pulse sequences of the brain and surrounding structures were obtained without intravenous contrast. COMPARISON:  11/28/2015 head CT. FINDINGS: No acute infarct. No intracranial  hemorrhage. Mild small vessel disease type changes. Global atrophy without hydrocephalus. No intracranial mass lesion noted on this unenhanced exam. Major intracranial vascular structures are patent. Post lens replacement otherwise orbital structures unremarkable. Ethmoid sinus air cell left maxillary sinus mucosal thickening. C3-4 bulge/protrusion with spinal stenosis and mild cord flattening. Facet degenerative changes greater on the right. Endplate reactive changes. Cervical medullary junction, pituitary region and pineal region unremarkable. IMPRESSION: Exam is motion degraded. No acute infarct or intracranial hemorrhage. Mild small vessel disease type changes. Global atrophy without hydrocephalus. No intracranial mass lesion noted on this  unenhanced exam. Ethmoid sinus air cell and left maxillary sinus mucosal thickening. C3-4 bulge/protrusion with spinal stenosis and mild cord flattening. Facet degenerative changes greater on the right. Endplate reactive changes. Electronically Signed   By: Genia Del M.D.   On: 11/28/2015 13:04   US Carotid Bilateral  11/28/2015  CLINICAL DATA:  CVA. EXAM: BILATERAL CAROTID DUPLEX ULTRASOUND TECHNIQUE: Pearline Cables scale imaging, color Doppler and duplex ultrasound were performed of bilateral carotid and vertebral arteries in the neck. COMPARISON:  None. FINDINGS: Criteria: Quantification of carotid stenosis is based on velocity parameters that correlate the residual internal carotid diameter with NASCET-based stenosis levels, using the diameter of the distal internal carotid lumen as the denominator for stenosis measurement. The following velocity measurements were obtained: RIGHT ICA:  56 cm/sec CCA:  73 cm/sec SYSTOLIC ICA/CCA RATIO:  0.8 DIASTOLIC ICA/CCA RATIO:  0.9 ECA:  130 cm/sec LEFT ICA:  92 cm/sec CCA:  84 cm/sec SYSTOLIC ICA/CCA RATIO:  1.1 DIASTOLIC ICA/CCA RATIO:  0.9 ECA:  114 cm/sec RIGHT CAROTID ARTERY: Small amount of echogenic plaque at the right carotid  bulb without significant stenosis. Right external carotid artery is patent. Right internal carotid artery is patent without significant plaque or stenosis. RIGHT VERTEBRAL ARTERY: Antegrade flow and normal waveform in the right vertebral artery. LEFT CAROTID ARTERY: Mild echogenic plaque at the left carotid bulb without significant stenosis. Echogenic plaque at the origin of the external carotid artery. Minimal plaque in the proximal internal carotid artery without significant stenosis. LEFT VERTEBRAL ARTERY: Antegrade flow and normal waveform in the left vertebral artery. IMPRESSION: Mild atherosclerotic disease in the carotid arteries, predominantly at the carotid bulbs. Estimated degree of stenosis in the internal carotid arteries is less than 50% bilaterally. Patent vertebral arteries. Electronically Signed   By: Markus Daft M.D.   On: 11/28/2015 17:44    EKG:   Orders placed or performed during the hospital encounter of 11/28/15  . ED EKG  . ED EKG  . ED EKG  . ED EKG      Management plans discussed with the patient, family and they are in agreement.  CODE STATUS:     Code Status Orders        Start     Ordered   11/28/15 1137  Full code   Continuous     11/28/15 1138    Advance Directive Documentation        Most Recent Value   Type of Advance Directive  Healthcare Power of Attorney   Pre-existing out of facility DNR order (yellow form or pink MOST form)     "MOST" Form in Place?        TOTAL TIME TAKING CARE OF THIS PATIENT: 35 minutes.    Vaughan Basta M.D on 11/29/2015 at 3:15 PM  Between 7am to 6pm - Pager - [phone]  After 6pm go to [email or web address] - password [email or web address] [facility]  [facility]  Office  [phone]  CC: Primary care physician; No primary care provider on file.   Note: This dictation was prepared with Dragon dictation along with smaller phrase technology. Any transcriptional errors that result from this process are  unintentional.

## 2015-12-18 ENCOUNTER — Ambulatory Visit (INDEPENDENT_AMBULATORY_CARE_PROVIDER_SITE_OTHER): Payer: Medicare Other | Admitting: Urology

## 2015-12-18 VITALS — BP 134/64 | HR 78 | Ht 67.0 in | Wt 176.0 lb

## 2015-12-18 DIAGNOSIS — R42 Dizziness and giddiness: Secondary | ICD-10-CM | POA: Insufficient documentation

## 2015-12-18 DIAGNOSIS — I639 Cerebral infarction, unspecified: Secondary | ICD-10-CM

## 2015-12-18 DIAGNOSIS — Z87442 Personal history of urinary calculi: Secondary | ICD-10-CM | POA: Diagnosis not present

## 2015-12-18 DIAGNOSIS — R310 Gross hematuria: Secondary | ICD-10-CM

## 2015-12-18 DIAGNOSIS — C4490 Unspecified malignant neoplasm of skin, unspecified: Secondary | ICD-10-CM | POA: Insufficient documentation

## 2015-12-18 DIAGNOSIS — D649 Anemia, unspecified: Secondary | ICD-10-CM | POA: Insufficient documentation

## 2015-12-18 LAB — MICROSCOPIC EXAMINATION
Epithelial Cells (non renal): NONE SEEN /hpf (ref 0–10)
RBC, UA: NONE SEEN /hpf (ref 0–?)

## 2015-12-18 LAB — URINALYSIS, COMPLETE
Bilirubin, UA: NEGATIVE
GLUCOSE, UA: NEGATIVE
KETONES UA: NEGATIVE
NITRITE UA: NEGATIVE
PROTEIN UA: NEGATIVE
SPEC GRAV UA: 1.015 (ref 1.005–1.030)
UUROB: 2 mg/dL — AB (ref 0.2–1.0)
pH, UA: 6 (ref 5.0–7.5)

## 2015-12-18 NOTE — Progress Notes (Signed)
Patient seen today for gross hematuria. He noticed red urine the other day which seems to have cleared. Patient is here with one of the aids from his nursing home who corroborates the story. He's had no flank pain or dysuria. He has had no stone passage.  Patient had an episode of gross hematuria last year. He underwent CT scan.Report of a CT scan last year showed bilateral nephrolithiasis measuring up to 9 mm as well as multiple Bosniak 1 cyst. There was also evidence of prostatic enlargement. He declined cystoscopy. He is on Uroxatral. He reports he is voiding without difficulty.  Patient has a history of prostate cancer treated with radiation in 2004. His August 2015 PSA was less than 0.1.  Patient was recently in hospital for stroke like symptoms. I reviewed the notes. His UA was normal. His BUN was 19 and creatinine 1.26 with a GFR of 51.  His urinalysis today is normal.  I reviewed his past medical and surgical history. His social and family history. His medications and allergies.   Physical exam: NAD Abd - soft, NT     Assessment/plan: -Gross hematuria - differential diagnosis includes prostatic bleeding, urinary tract stone or secondary malignancy given his history of radiation. Recommended imaging and cystoscopy. Given decreased GFR will get a non-con CT to start. F/u to review and for cystoscopy. Given h/o PCa and radiation - PSA was sent.

## 2015-12-19 LAB — PSA

## 2015-12-19 IMAGING — US US CAROTID DUPLEX BILAT
1 series · 13 of 24 positions shown · non-contrast
Comparison: None.

CLINICAL DATA: CVA.

EXAM:
BILATERAL CAROTID DUPLEX ULTRASOUND
TECHNIQUE: Gray scale imaging, color Doppler and duplex ultrasound were
performed of bilateral carotid and vertebral arteries in the neck.

[Series 1: us carotid duplex bilat · 13 of 54 slices shown]
[im 1/54]
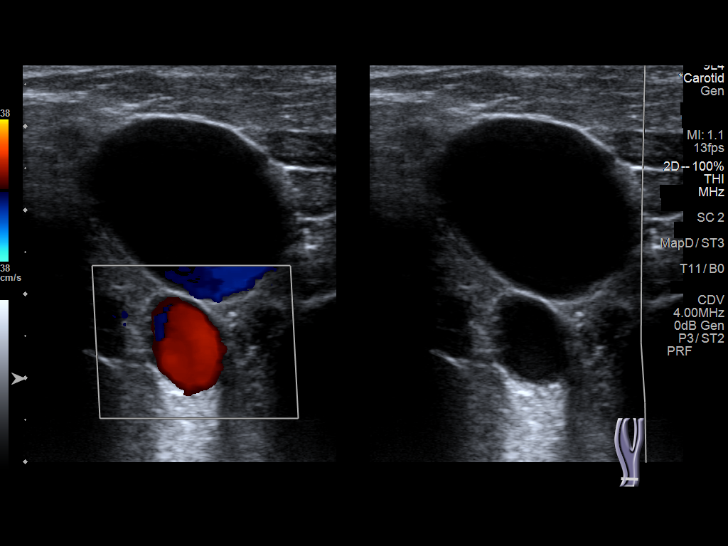
[im 5/54]
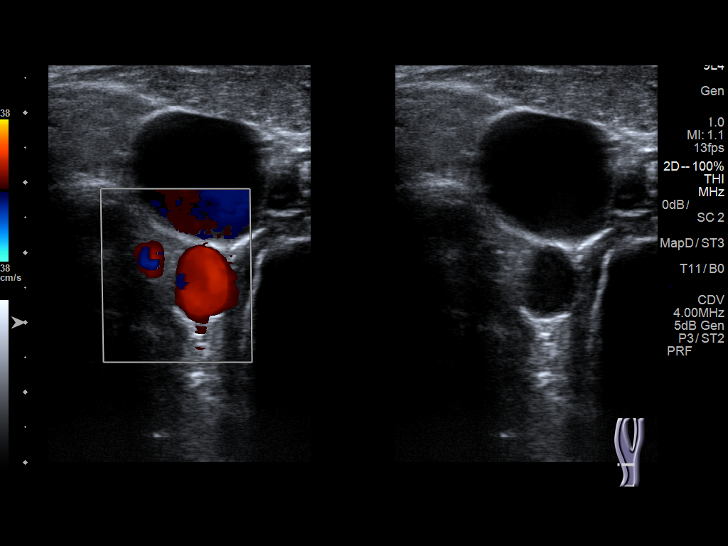
[im 10/54]
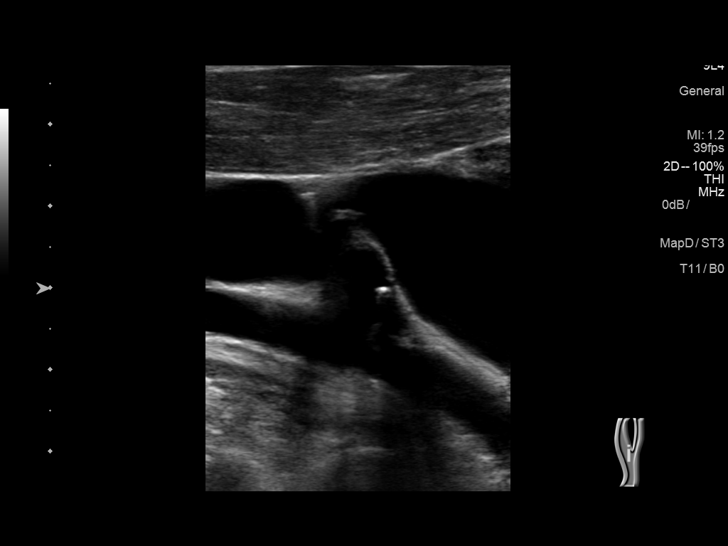
[im 14/54]
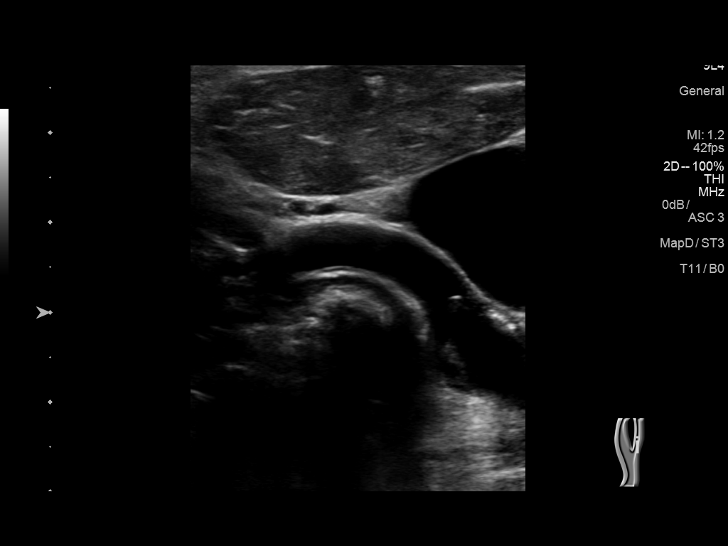
[im 19/54]
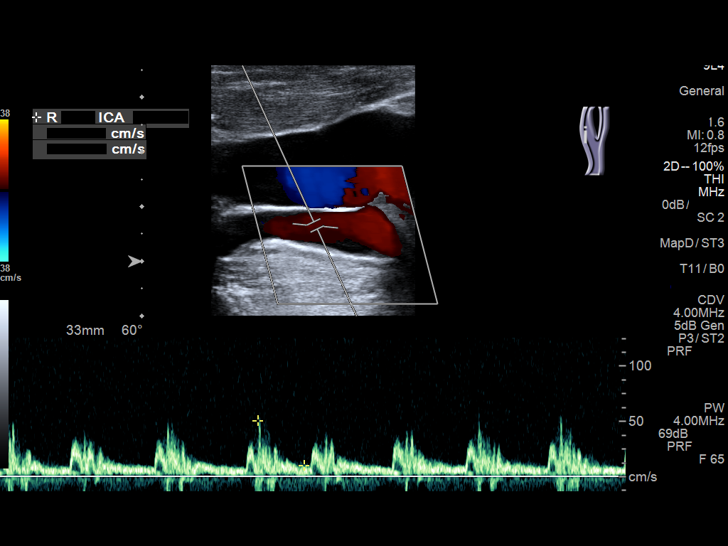
[im 24/54]
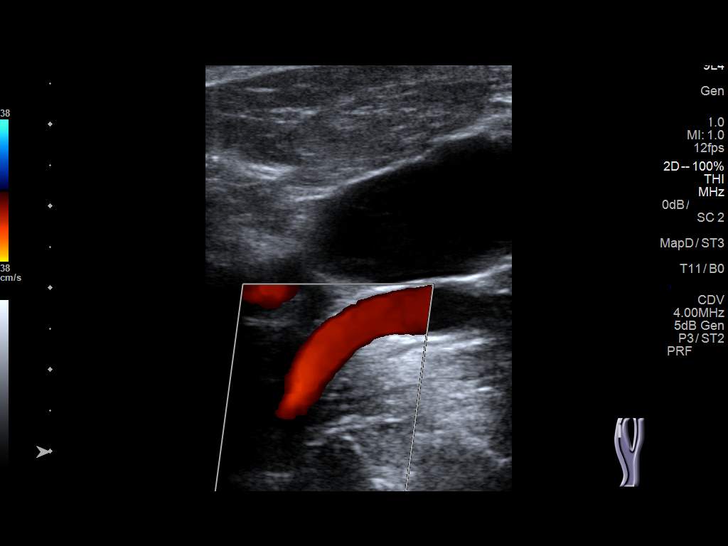
[im 28/54]
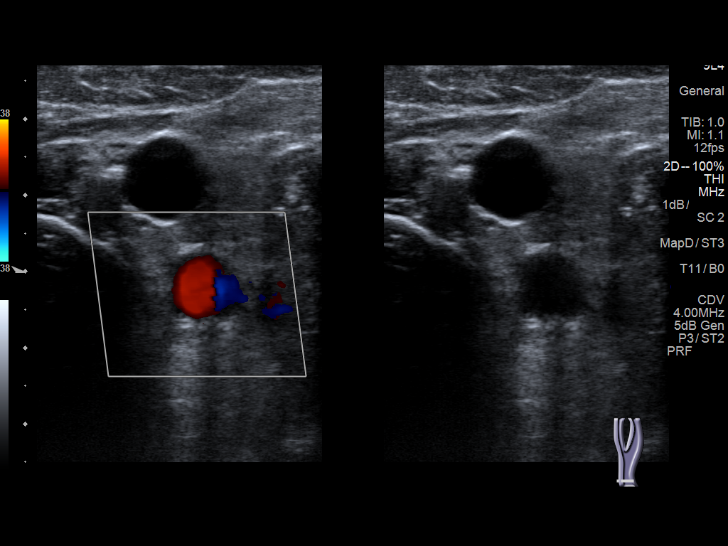
[im 30/54]
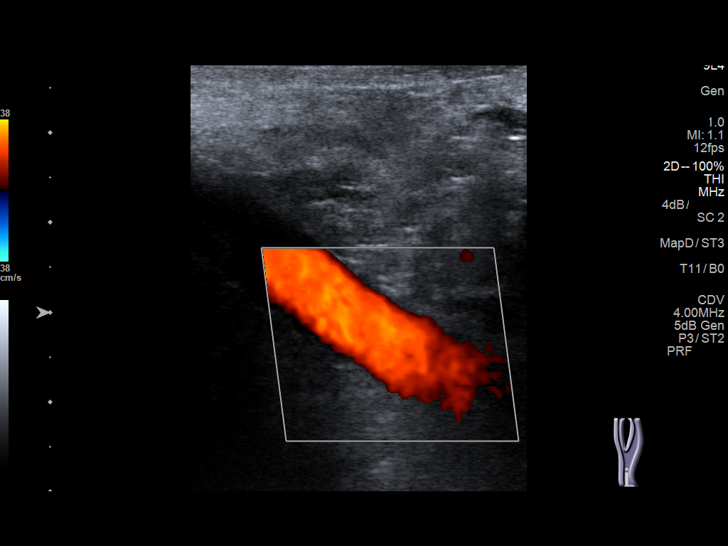
[im 35/54]
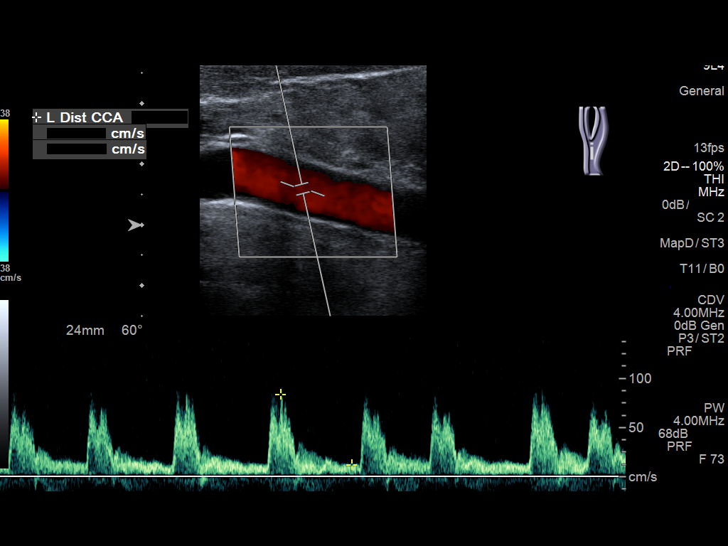
[im 40/54]
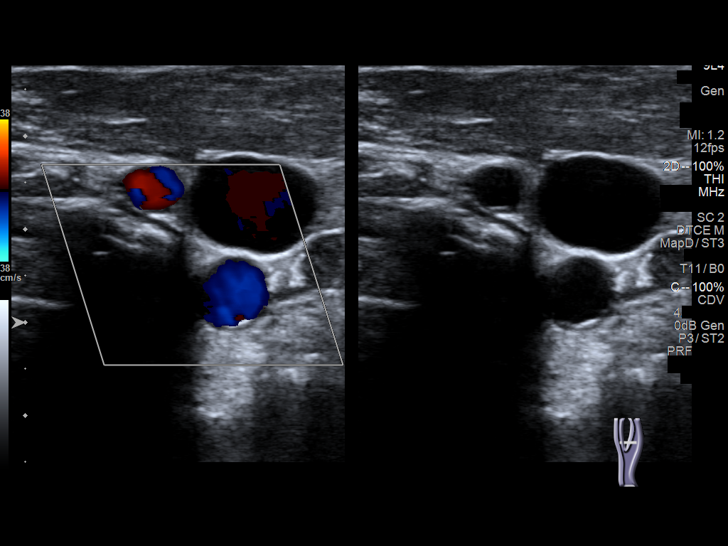
[im 44/54]
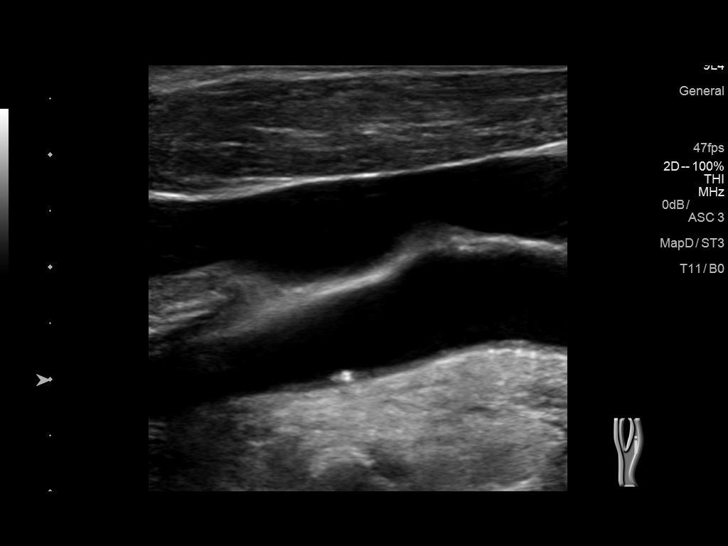
[im 49/54]
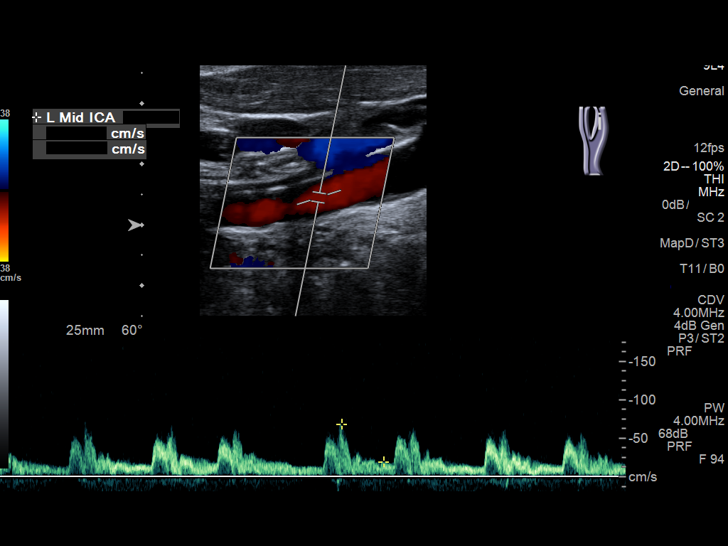
[im 54/54]
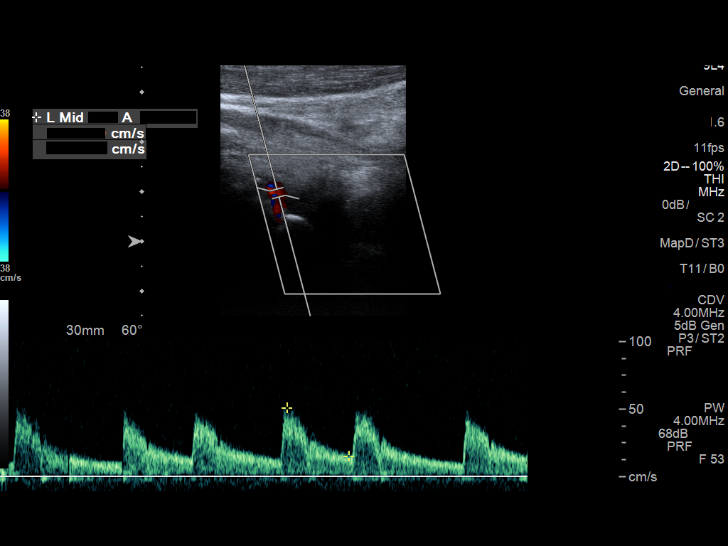

[13 of 24 positions shown; findings below may reference images not displayed]

FINDINGS: Criteria: Quantification of carotid stenosis is based on velocity
parameters that correlate the residual internal carotid diameter
with NASCET-based stenosis levels, using the diameter of the distal
internal carotid lumen as the denominator for stenosis measurement.

The following velocity measurements were obtained:

RIGHT

ICA:  56 cm/sec

CCA:  73 cm/sec

SYSTOLIC ICA/CCA RATIO:

DIASTOLIC ICA/CCA RATIO:

ECA:  130 cm/sec

LEFT

ICA:  92 cm/sec

CCA:  84 cm/sec

SYSTOLIC ICA/CCA RATIO:

DIASTOLIC ICA/CCA RATIO:

ECA:  114 cm/sec

RIGHT CAROTID ARTERY: Small amount of echogenic plaque at the right
carotid bulb without significant stenosis. Right external carotid
artery is patent. Right internal carotid artery is patent without
significant plaque or stenosis.

RIGHT VERTEBRAL ARTERY: Antegrade flow and normal waveform in the
right vertebral artery.

LEFT CAROTID ARTERY: Mild echogenic plaque at the left carotid bulb
without significant stenosis. Echogenic plaque at the origin of the
external carotid artery. Minimal plaque in the proximal internal
carotid artery without significant stenosis.

LEFT VERTEBRAL ARTERY: Antegrade flow and normal waveform in the
left vertebral artery.
IMPRESSION: Mild atherosclerotic disease in the carotid arteries, predominantly
at the carotid bulbs. Estimated degree of stenosis in the internal
carotid arteries is less than 50% bilaterally.

Patent vertebral arteries.

## 2015-12-20 DIAGNOSIS — M4802 Spinal stenosis, cervical region: Secondary | ICD-10-CM | POA: Insufficient documentation

## 2015-12-29 ENCOUNTER — Ambulatory Visit: Admission: RE | Admit: 2015-12-29 | Payer: Medicare Other | Source: Ambulatory Visit

## 2016-01-01 ENCOUNTER — Encounter
Admission: RE | Admit: 2016-01-01 | Discharge: 2016-01-01 | Disposition: A | Discharge status: Home / Self Care | Payer: Medicare Other | Source: Ambulatory Visit | Attending: Gerontology | Admitting: Gerontology

## 2016-01-01 DIAGNOSIS — I4891 Unspecified atrial fibrillation: Secondary | ICD-10-CM | POA: Diagnosis not present

## 2016-01-01 LAB — PROTIME-INR
INR: 2.5
PROTHROMBIN TIME: 26.7 s — AB (ref 11.4–15.0)

## 2016-01-03 ENCOUNTER — Encounter
Admission: RE | Admit: 2016-01-03 | Discharge: 2016-01-03 | Disposition: A | Discharge status: Home / Self Care | Payer: Medicare Other | Source: Ambulatory Visit | Attending: Internal Medicine | Admitting: Internal Medicine

## 2016-01-03 DIAGNOSIS — I4891 Unspecified atrial fibrillation: Secondary | ICD-10-CM | POA: Insufficient documentation

## 2016-01-03 DIAGNOSIS — R410 Disorientation, unspecified: Secondary | ICD-10-CM | POA: Insufficient documentation

## 2016-01-03 DIAGNOSIS — R443 Hallucinations, unspecified: Secondary | ICD-10-CM | POA: Insufficient documentation

## 2016-01-04 DIAGNOSIS — I4891 Unspecified atrial fibrillation: Secondary | ICD-10-CM | POA: Diagnosis present

## 2016-01-04 DIAGNOSIS — R410 Disorientation, unspecified: Secondary | ICD-10-CM | POA: Diagnosis not present

## 2016-01-04 DIAGNOSIS — R443 Hallucinations, unspecified: Secondary | ICD-10-CM | POA: Diagnosis not present

## 2016-01-04 LAB — PROTIME-INR
INR: 2.54
PROTHROMBIN TIME: 27 s — AB (ref 11.4–15.0)

## 2016-01-08 ENCOUNTER — Other Ambulatory Visit: Payer: Self-pay

## 2016-01-09 DIAGNOSIS — I4891 Unspecified atrial fibrillation: Secondary | ICD-10-CM | POA: Diagnosis not present

## 2016-01-09 LAB — PROTIME-INR
INR: 3.48
Prothrombin Time: 34.2 seconds — ABNORMAL HIGH (ref 11.4–15.0)

## 2016-01-10 ENCOUNTER — Telehealth: Payer: Self-pay | Admitting: Urology

## 2016-01-10 NOTE — Telephone Encounter (Signed)
I called to see why this patient No showed for his CT scan and his daughter said that she had cancelled that appointment due to her dad being moved to an assisted living home due to his severe memory loss. Michela Pitcher he has not had anymore blood in his urine and they are not going to make anymore appointments at this time due to his failing health. I just let her know that we are here if they need [place] for anything.  Thanks, Sharyn Lull

## 2016-01-11 DIAGNOSIS — I4891 Unspecified atrial fibrillation: Secondary | ICD-10-CM | POA: Diagnosis not present

## 2016-01-11 LAB — PROTIME-INR
INR: 2.69
Prothrombin Time: 28.2 seconds — ABNORMAL HIGH (ref 11.4–15.0)

## 2016-01-16 DIAGNOSIS — I4891 Unspecified atrial fibrillation: Secondary | ICD-10-CM | POA: Diagnosis not present

## 2016-01-16 LAB — PROTIME-INR
INR: 3.94
Prothrombin Time: 37.6 seconds — ABNORMAL HIGH (ref 11.4–15.0)

## 2016-01-18 DIAGNOSIS — I4891 Unspecified atrial fibrillation: Secondary | ICD-10-CM | POA: Diagnosis not present

## 2016-01-18 LAB — PROTIME-INR
INR: 2.65
Prothrombin Time: 27.9 seconds — ABNORMAL HIGH (ref 11.4–15.0)

## 2016-01-23 DIAGNOSIS — I4891 Unspecified atrial fibrillation: Secondary | ICD-10-CM | POA: Diagnosis not present

## 2016-01-23 LAB — PROTIME-INR
INR: 2.29
Prothrombin Time: 25 seconds — ABNORMAL HIGH (ref 11.4–15.0)

## 2016-01-24 DIAGNOSIS — I4891 Unspecified atrial fibrillation: Secondary | ICD-10-CM | POA: Diagnosis not present

## 2016-01-24 LAB — CBC WITH DIFFERENTIAL/PLATELET
BASOS ABS: 0 10*3/uL (ref 0–0.1)
BASOS PCT: 0 %
EOS ABS: 0 10*3/uL (ref 0–0.7)
Eosinophils Relative: 1 %
HEMATOCRIT: 38.4 % — AB (ref 40.0–52.0)
HEMOGLOBIN: 12.5 g/dL — AB (ref 13.0–18.0)
Lymphocytes Relative: 14 %
Lymphs Abs: 0.9 10*3/uL — ABNORMAL LOW (ref 1.0–3.6)
MCH: 33.2 pg (ref 26.0–34.0)
MCHC: 32.7 g/dL (ref 32.0–36.0)
MCV: 101.5 fL — ABNORMAL HIGH (ref 80.0–100.0)
MONO ABS: 0.5 10*3/uL (ref 0.2–1.0)
Monocytes Relative: 8 %
NEUTROS ABS: 4.6 10*3/uL (ref 1.4–6.5)
NEUTROS PCT: 77 %
Platelets: 209 10*3/uL (ref 150–440)
RBC: 3.78 MIL/uL — ABNORMAL LOW (ref 4.40–5.90)
RDW: 13.9 % (ref 11.5–14.5)
WBC: 6 10*3/uL (ref 3.8–10.6)

## 2016-01-24 LAB — COMPREHENSIVE METABOLIC PANEL
ALBUMIN: 3.9 g/dL (ref 3.5–5.0)
ALT: 17 U/L (ref 17–63)
AST: 25 U/L (ref 15–41)
Alkaline Phosphatase: 55 U/L (ref 38–126)
Anion gap: 15 (ref 5–15)
BILIRUBIN TOTAL: 0.7 mg/dL (ref 0.3–1.2)
BUN: 32 mg/dL — AB (ref 6–20)
CO2: 24 mmol/L (ref 22–32)
Calcium: 9.8 mg/dL (ref 8.9–10.3)
Chloride: 98 mmol/L — ABNORMAL LOW (ref 101–111)
Creatinine, Ser: 1.72 mg/dL — ABNORMAL HIGH (ref 0.61–1.24)
GFR calc Af Amer: 41 mL/min — ABNORMAL LOW (ref >60)
GFR calc non Af Amer: 35 mL/min — ABNORMAL LOW (ref >60)
GLUCOSE: 82 mg/dL (ref 65–99)
POTASSIUM: 4.5 mmol/L (ref 3.5–5.1)
SODIUM: 137 mmol/L (ref 135–145)
TOTAL PROTEIN: 7 g/dL (ref 6.5–8.1)

## 2016-01-24 LAB — TSH: TSH: 1.457 u[IU]/mL (ref 0.350–4.500)

## 2016-01-24 LAB — MAGNESIUM: Magnesium: 1.7 mg/dL (ref 1.7–2.4)

## 2016-01-25 LAB — PROLACTIN: Prolactin: 0.1 ng/mL — ABNORMAL LOW (ref 4.0–15.2)

## 2016-01-25 LAB — VITAMIN B12: VITAMIN B 12: 757 pg/mL (ref 180–914)

## 2016-01-25 LAB — VITAMIN D 25 HYDROXY (VIT D DEFICIENCY, FRACTURES): Vit D, 25-Hydroxy: 34 ng/mL (ref 30.0–100.0)

## 2016-01-26 DIAGNOSIS — I4891 Unspecified atrial fibrillation: Secondary | ICD-10-CM | POA: Diagnosis not present

## 2016-01-26 LAB — COMPREHENSIVE METABOLIC PANEL
ALBUMIN: 3.5 g/dL (ref 3.5–5.0)
ALT: 14 U/L — ABNORMAL LOW (ref 17–63)
AST: 24 U/L (ref 15–41)
Alkaline Phosphatase: 57 U/L (ref 38–126)
Anion gap: 5 (ref 5–15)
BILIRUBIN TOTAL: 1 mg/dL (ref 0.3–1.2)
BUN: 25 mg/dL — AB (ref 6–20)
CHLORIDE: 103 mmol/L (ref 101–111)
CO2: 29 mmol/L (ref 22–32)
Calcium: 9 mg/dL (ref 8.9–10.3)
Creatinine, Ser: 1.46 mg/dL — ABNORMAL HIGH (ref 0.61–1.24)
GFR calc Af Amer: 49 mL/min — ABNORMAL LOW (ref >60)
GFR calc non Af Amer: 43 mL/min — ABNORMAL LOW (ref >60)
GLUCOSE: 88 mg/dL (ref 65–99)
POTASSIUM: 3.9 mmol/L (ref 3.5–5.1)
Sodium: 137 mmol/L (ref 135–145)
Total Protein: 6.3 g/dL — ABNORMAL LOW (ref 6.5–8.1)

## 2016-01-26 LAB — URINALYSIS COMPLETE WITH MICROSCOPIC (ARMC ONLY)
BACTERIA UA: NONE SEEN
Bilirubin Urine: NEGATIVE
Glucose, UA: NEGATIVE mg/dL
Ketones, ur: NEGATIVE mg/dL
Leukocytes, UA: NEGATIVE
Nitrite: NEGATIVE
PROTEIN: NEGATIVE mg/dL
Specific Gravity, Urine: 1.012 (ref 1.005–1.030)
pH: 7 (ref 5.0–8.0)

## 2016-01-26 LAB — CBC WITH DIFFERENTIAL/PLATELET
BASOS ABS: 0 10*3/uL (ref 0–0.1)
BASOS PCT: 0 %
Eosinophils Absolute: 0.2 10*3/uL (ref 0–0.7)
Eosinophils Relative: 4 %
HEMATOCRIT: 34.4 % — AB (ref 40.0–52.0)
Hemoglobin: 11.7 g/dL — ABNORMAL LOW (ref 13.0–18.0)
Lymphocytes Relative: 12 %
Lymphs Abs: 0.5 10*3/uL — ABNORMAL LOW (ref 1.0–3.6)
MCH: 34.4 pg — ABNORMAL HIGH (ref 26.0–34.0)
MCHC: 34.1 g/dL (ref 32.0–36.0)
MCV: 100.7 fL — ABNORMAL HIGH (ref 80.0–100.0)
MONO ABS: 0.6 10*3/uL (ref 0.2–1.0)
Monocytes Relative: 13 %
NEUTROS ABS: 3.2 10*3/uL (ref 1.4–6.5)
NEUTROS PCT: 71 %
PLATELETS: 180 10*3/uL (ref 150–440)
RBC: 3.42 MIL/uL — ABNORMAL LOW (ref 4.40–5.90)
RDW: 13.6 % (ref 11.5–14.5)
WBC: 4.5 10*3/uL (ref 3.8–10.6)

## 2016-01-27 ENCOUNTER — Emergency Department
Admission: EM | Admit: 2016-01-27 | Discharge: 2016-01-27 | Disposition: A | Discharge status: Home / Self Care | Payer: Medicare Other | Attending: Emergency Medicine | Admitting: Emergency Medicine

## 2016-01-27 ENCOUNTER — Emergency Department: Payer: Medicare Other

## 2016-01-27 DIAGNOSIS — G20 Parkinson's disease: Secondary | ICD-10-CM

## 2016-01-27 DIAGNOSIS — Z7901 Long term (current) use of anticoagulants: Secondary | ICD-10-CM | POA: Diagnosis not present

## 2016-01-27 DIAGNOSIS — R55 Syncope and collapse: Secondary | ICD-10-CM | POA: Insufficient documentation

## 2016-01-27 DIAGNOSIS — Z880 Allergy status to penicillin: Secondary | ICD-10-CM | POA: Diagnosis not present

## 2016-01-27 DIAGNOSIS — Z7952 Long term (current) use of systemic steroids: Secondary | ICD-10-CM | POA: Diagnosis not present

## 2016-01-27 LAB — URINALYSIS COMPLETE WITH MICROSCOPIC (ARMC ONLY)
BILIRUBIN URINE: NEGATIVE
Bacteria, UA: NONE SEEN
GLUCOSE, UA: NEGATIVE mg/dL
LEUKOCYTES UA: NEGATIVE
Nitrite: NEGATIVE
Protein, ur: NEGATIVE mg/dL
SPECIFIC GRAVITY, URINE: 1.015 (ref 1.005–1.030)
SQUAMOUS EPITHELIAL / LPF: NONE SEEN
pH: 6 (ref 5.0–8.0)

## 2016-01-27 LAB — CBC WITH DIFFERENTIAL/PLATELET
BASOS ABS: 0 10*3/uL (ref 0–0.1)
Basophils Relative: 0 %
EOS PCT: 2 %
Eosinophils Absolute: 0.1 10*3/uL (ref 0–0.7)
HCT: 33.1 % — ABNORMAL LOW (ref 40.0–52.0)
HEMOGLOBIN: 11.3 g/dL — AB (ref 13.0–18.0)
LYMPHS PCT: 11 %
Lymphs Abs: 0.5 10*3/uL — ABNORMAL LOW (ref 1.0–3.6)
MCH: 34.3 pg — ABNORMAL HIGH (ref 26.0–34.0)
MCHC: 34.1 g/dL (ref 32.0–36.0)
MCV: 100.7 fL — AB (ref 80.0–100.0)
Monocytes Absolute: 0.5 10*3/uL (ref 0.2–1.0)
Monocytes Relative: 10 %
NEUTROS ABS: 3.8 10*3/uL (ref 1.4–6.5)
NEUTROS PCT: 77 %
PLATELETS: 159 10*3/uL (ref 150–440)
RBC: 3.29 MIL/uL — AB (ref 4.40–5.90)
RDW: 13.3 % (ref 11.5–14.5)
WBC: 5 10*3/uL (ref 3.8–10.6)

## 2016-01-27 LAB — URINE CULTURE

## 2016-01-27 LAB — COMPREHENSIVE METABOLIC PANEL
ALT: 6 U/L — ABNORMAL LOW (ref 17–63)
ANION GAP: 7 (ref 5–15)
AST: 22 U/L (ref 15–41)
Albumin: 3.5 g/dL (ref 3.5–5.0)
Alkaline Phosphatase: 53 U/L (ref 38–126)
BUN: 20 mg/dL (ref 6–20)
CHLORIDE: 104 mmol/L (ref 101–111)
CO2: 25 mmol/L (ref 22–32)
CREATININE: 1.25 mg/dL — AB (ref 0.61–1.24)
Calcium: 8.7 mg/dL — ABNORMAL LOW (ref 8.9–10.3)
GFR, EST AFRICAN AMERICAN: 60 mL/min — AB (ref >60)
GFR, EST NON AFRICAN AMERICAN: 51 mL/min — AB (ref >60)
Glucose, Bld: 99 mg/dL (ref 65–99)
POTASSIUM: 4 mmol/L (ref 3.5–5.1)
SODIUM: 136 mmol/L (ref 135–145)
Total Bilirubin: 0.9 mg/dL (ref 0.3–1.2)
Total Protein: 6.1 g/dL — ABNORMAL LOW (ref 6.5–8.1)

## 2016-01-27 LAB — PROTIME-INR
INR: 2.83
PROTHROMBIN TIME: 29.3 s — AB (ref 11.4–15.0)

## 2016-01-27 LAB — TROPONIN I

## 2016-01-27 MED ORDER — SODIUM CHLORIDE 0.9 % IV BOLUS (SEPSIS)
500.0000 mL | Freq: Once | INTRAVENOUS | Status: AC
  Administered 2016-01-27: 500 mL via INTRAVENOUS

## 2016-01-27 NOTE — ED Notes (Signed)
Pt was going to bathroom and had syncopal episode, was caught by staff. Had IV placed by staff at [facility] for dehydration treatment

## 2016-01-27 NOTE — Discharge Instructions (Signed)
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 He should make a follow-up appointment with a neurologist as soon as possible.  Because of grief and possible depression, I do asked that you follow up with your primary care physician for further evaluation and treatment.  Return to the emergency department for any worsening condition including altered mental status, fever, chest pain, dizziness or passing out.    Parkinson Disease Parkinson disease is a disorder of the brain and spinal cord (central nervous system). The person will slowly lose the ability to control his or her body movements. This happens due to:  Damaged nerve cells.  Low levels of a certain brain chemical. HOME CARE  Exercise often.  Make time to rest during the day.  Take all medicine as told by your doctor.  Replace buttons and zippers with elastic and Velcro if getting dressed is difficult.  Put grab bars or rails in your home. This helps you to not fall.  Go to speech therapy or therapy to help you with daily activities (occupational therapy). Do this as told by your doctor.  Keep all doctor visits as told. GET HELP IF:  Your medicine does not help your symptoms.  You fall.  You have trouble swallowing or choke on your food. MAKE SURE YOU:  Understand these instructions.  Will watch your condition.  Will get help right away if you are not doing well or get worse.   This information is not intended to replace advice given to you by your health care provider. Make sure you discuss any questions you have with your health care provider.   Document Released: 03/09/2012 Document Revised: 04/12/2013 Document Reviewed: 03/09/2012 Elsevier Interactive Patient Education [employer].  Syncope Syncope is a medical term for fainting or passing out. This means you lose consciousness and drop to the ground. People are  generally unconscious for less than 5 minutes. You may have some muscle twitches for up to 15 seconds before waking up and returning to normal. Syncope occurs more often in older adults, but it can happen to anyone. While most causes of syncope are not dangerous, syncope can be a sign of a serious medical problem. It is important to seek medical care.  CAUSES  Syncope is caused by a sudden drop in blood flow to the brain. The specific cause is often not determined. Factors that can bring on syncope include:  Taking medicines that lower blood pressure.  Sudden changes in posture, such as standing up quickly.  Taking more medicine than prescribed.  Standing in one place for too long.  Seizure disorders.  Dehydration and excessive exposure to heat.  Low blood sugar (hypoglycemia).  Straining to have a bowel movement.  Heart disease, irregular heartbeat, or other circulatory problems.  Fear, emotional distress, seeing blood, or severe pain. SYMPTOMS  Right before fainting, you may:  Feel dizzy or light-headed.  Feel nauseous.  See all white or all black in your field of vision.  Have cold, clammy skin. DIAGNOSIS  Your health care provider will ask about your symptoms, perform a physical exam, and perform an electrocardiogram (ECG) to record the electrical activity of your heart. Your health care provider may also perform other heart or blood tests to determine the cause of your syncope which may include:  Transthoracic echocardiogram (TTE). During echocardiography, sound waves are used to evaluate how blood flows through your heart.  Transesophageal echocardiogram (TEE).  Cardiac monitoring. This allows your health care provider to monitor your heart rate and rhythm in real time.  Holter monitor. This is a portable device that records your heartbeat and can help diagnose heart arrhythmias. It allows your health care provider to track your heart activity for several days, if  needed.  Stress tests by exercise or by giving medicine that makes the heart beat faster. TREATMENT  In most cases, no treatment is needed. Depending on the cause of your syncope, your health care provider may recommend changing or stopping some of your medicines. HOME CARE INSTRUCTIONS  Have someone stay with you until you feel stable.  Do not drive, use machinery, or play sports until your health care provider says it is okay.  Keep all follow-up appointments as directed by your health care provider.  Lie down right away if you start feeling like you might faint. Breathe deeply and steadily. Wait until all the symptoms have passed.  Drink enough fluids to keep your urine clear or pale yellow.  If you are taking blood pressure or heart medicine, get up slowly and take several minutes to sit and then stand. This can reduce dizziness. SEEK IMMEDIATE MEDICAL CARE IF:   You have a severe headache.  You have unusual pain in the chest, abdomen, or back.  You are bleeding from your mouth or rectum, or you have black or tarry stool.  You have an irregular or very fast heartbeat.  You have pain with breathing.  You have repeated fainting or seizure-like jerking during an episode.  You faint when sitting or lying down.  You have confusion.  You have trouble walking.  You have severe weakness.  You have vision problems. If you fainted, call your local emergency services (911 in [place] Do not drive yourself to the hospital.    This information is not intended to replace advice given to you by your health care provider. Make sure you discuss any questions you have with your health care provider.   Document Released: 12/16/2005 Document Revised: 05/02/2015 Document Reviewed: 02/14/2012 Elsevier Interactive Patient Education [employer].

## 2016-01-27 NOTE — ED Notes (Signed)
This RN went to discharge Pt, noticed BP had dropped  To 89/53, EDP made aware and ordered fluid bolus before D/C

## 2016-01-27 NOTE — ED Provider Notes (Signed)
[facility] Emergency Department Provider Note   ____________________________________________  Time seen: Approximately 11:30 AM I have reviewed the triage vital signs and the triage nursing note.  HISTORY  Chief Complaint Loss of Consciousness   Historian Limited historian, Patient History provided by report from nursing home  HPI [name] [name] is a 82 y.o. male with a history of Parkinson's disease, generalized weakness, and what sounds like some neurologic symptoms of Parkinson's, is here after a witnessed episode of syncope. Apparently staff was getting him upright and upon standing he passed out. No injury as he was caught. He had recently been diagnosed with dehydration. Patient denies chest pain or trouble breathing. Reports no recent vomiting or diarrhea. No abdominal pain. Again no head injury. He does have generalized muscle weakness. Symptoms are moderate.    Past Medical History  Diagnosis Date  . Atrial fibrillation ([place])   . Parkinson's disease ([place])   . CHF (congestive heart failure) ([place])   . Prostate cancer ([place])   . Vertigo   . Psoriasis     Patient Active Problem List   Diagnosis Date Noted  . Absolute anemia 12/18/2015  . CA of skin 12/18/2015  . Head revolving around 12/18/2015  . Depression, major, single episode, mild ([place]) 11/29/2015  . Alzheimer's disease 11/29/2015  . CVA (cerebral infarction) 11/28/2015  . Excessive salivation 04/14/2015  . Abnormal rapid eye movement sleep 01/21/2015  . Degeneration of intervertebral disc of lumbar region 01/04/2015  . Neuritis or radiculitis due to rupture of lumbar intervertebral disc 01/04/2015  . Atrial fibrillation ([place]) 08/01/2014  . Anxiety 05/11/2014  . Congestive heart failure ([place]) 05/11/2014  . HLD (hyperlipidemia) 05/11/2014  . Calculus of kidney 05/11/2014  . Parkinson's disease ([place]) 05/11/2014  . 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 [place]ison anemia 05/11/2014   . Malignant neoplasm of prostate ([place]) 05/11/2014  . Psoriatic arthritis ([place]) 05/11/2014  . Disturbance in sleep behavior 05/11/2014  . Spinal stenosis 05/11/2014  . Gastrointestinal bleeding, upper 05/11/2014  . Heart valve disease 05/11/2014  . Long term current use of anticoagulant 05/02/2014    History reviewed. No pertinent past surgical history.  Current Outpatient Rx  Name  Route  Sig  Dispense  Refill  . acetaminophen (TYLENOL) 500 MG tablet   Oral   Take 500 mg by mouth 2 (two) times daily.         Marland Kitchen alfuzosin (UROXATRAL) 10 MG 24 hr tablet   Oral   Take 10 mg by mouth daily.         . carbidopa-levodopa (SINEMET IR) 25-100 MG tablet   Oral   Take 1.5 tablets by mouth 4 (four) times daily.   100 tablet   0   . chlordiazePOXIDE (LIBRIUM) 10 MG capsule   Oral   Take 10 mg by mouth daily.          . cholecalciferol (VITAMIN D) 1000 UNITS tablet   Oral   Take 1,000 Units by mouth daily.         . clonazePAM (KLONOPIN) 1 MG tablet   Oral   Take 1 mg by mouth every evening.         . fenofibrate (TRICOR) 145 MG tablet   Oral   Take 145 mg by mouth daily.         . folic acid (FOLVITE) 1 MG tablet   Oral   Take 1 mg by mouth daily.         Marland Kitchen  hydrochlorothiazide (MICROZIDE) 12.5 MG capsule   Oral   Take 12.5 mg by mouth 3 (three) times a week.         . Melatonin 3 MG CAPS   Oral   Take 3 mg by mouth every evening.         . methotrexate 2.5 MG tablet   Oral   Take 12.5 mg by mouth once a week. On Wednesday with a meal         . predniSONE (DELTASONE) 2.5 MG tablet   Oral   Take 2.5 mg by mouth daily with breakfast.         . ropinirole (REQUIP) 5 MG tablet   Oral   Take 5 mg by mouth QID.         Marland Kitchen vitamin B-12 (CYANOCOBALAMIN) 1000 MCG tablet   Oral   Take 1,000 mcg by mouth daily.         Marland Kitchen warfarin (COUMADIN) 2.5 MG tablet   Oral   Take 2.5 mg by mouth daily. On Monday, Wednesday, Friday, Saturday         .  warfarin (COUMADIN) 5 MG tablet   Oral   Take 5 mg by mouth daily. On Sunday, Tuesday, and Thursday at 6pm           Allergies Penicillins  Family History  Problem Relation Age of Onset  . Hypertension Mother     Social History Social History  Substance Use Topics  . Smoking status: Never Smoker   . Smokeless tobacco: None  . Alcohol Use: None    Review of Systems  Constitutional: Negative for fever. Eyes: Negative for visual changes. ENT: Negative for sore throat. Cardiovascular: Negative for chest pain. Respiratory: Negative for shortness of breath. Gastrointestinal: Negative for abdominal pain, vomiting and diarrhea. Genitourinary: Negative for dysuria. Musculoskeletal: Negative for back pain. Skin: Negative for rash. Neurological: Negative for headache. 10 point Review of Systems otherwise negative ____________________________________________   PHYSICAL EXAM:  VITAL SIGNS: ED Triage Vitals  Enc Vitals Group     BP --      Pulse --      Resp --      Temp 01/27/16 1050 97.6 F (36.4 C)     Temp src --      SpO2 --      Weight --      Height --      Head Cir --      Peak Flow --      Pain Score --      Pain Loc --      Pain Edu? --      Excl. in [place]? --      Constitutional: Alert and cooperative but disoriented. Well appearing and in no distress. Eyes: Conjunctivae are normal. PERRL. Normal extraocular movements. ENT   Head: Normocephalic and atraumatic.   Nose: No congestion/rhinnorhea.   Mouth/Throat: Mucous membranes are mildly dry. Very soft voice..   Neck: No stridor. Cardiovascular/Chest: Normal rate, regular rhythm.  No murmurs, rubs, or gallops. Respiratory: Normal respiratory effort without tachypnea nor retractions. Breath sounds are clear and equal bilaterally. No wheezes/rales/rhonchi. Gastrointestinal: Soft. No distention, no guarding, no rebound. Nontender.   Genitourinary/rectal:Deferred Musculoskeletal: Nontender  with normal range of motion in all extremities. No joint effusions.  No lower extremity tenderness.  No edema. Neurologic:  Soft voice, but no slurred speech. Generalized weakness in 4 extremities, but nonfocal. No numbness. Skin:  Skin is warm, dry and intact. No  rash noted. Psychiatric: No active hallucinations.  ____________________________________________   EKG I, Lisa Roca, MD, the attending physician have personally viewed and interpreted all ECGs.  73 bpm. Atrial fibrillation. Narrow QRS. Normal axis. Nonspecific T-wave ____________________________________________  LABS (pertinent positives/negatives)  Urinalysis 6-30 red blood cells, trace ketones, otherwise no sign of infection Comprehensive metabolic panel significant for creatinine 1.25 Troponin less than 0.03 White blood count 5.0, hemoglobin 11.3 and platelet count 159 INR 2.83  ____________________________________________  RADIOLOGY All Xrays were viewed by me. Imaging interpreted by Radiologist.  CT head noncontrast: No acute intracranial abnormality. Atrophy, chronic microvascular disease. __________________________________________  PROCEDURES  Procedure(s) performed: None  Critical Care performed: None  ____________________________________________   ED COURSE / ASSESSMENT AND PLAN  Pertinent labs & imaging results that were available during my care of the patient were reviewed by me and considered in my medical decision making (see chart for details).   This patient with Parkinson's does have generalized muscle weakness, and I am suspicious that his syncopal episode with standing today was likely orthostatic in nature with recent diagnosis of dehydration. He does appear mildly dehydrated, but no hypotension. Given the fact that there was questionable possible seizure a few days ago without imaging, and now syncope, I am going to check head CT. He does take warfarin for history of A. Fib.   No  evidence of UTI. He has essentially been bedridden it sounds like for a couple months now. So he is unable to stand on his own. His blood pressure did drop from 160-120 with lying to sitting. I'm suspicious of dysautonomia as a part of his neurologic disease progression.  In either case, no acute indication for hospitalization or additional workup here in emergency department.  Patient will be discharged back to the memory care unit.  Daughter and caregiver did come to the bedside and discussed that the patient has recently lost his wife of 60+ years and seems to have some degree/depression symptoms. I've asked them to contact the patient's primary care physician for follow-up for this. For his neurologic condition, I'm asking him to follow-up with his neurologist.   CONSULTATIONS:   None   Patient / Family / Caregiver informed of clinical course, medical decision-making process, and agree with plan.   I discussed return precautions, follow-up instructions, and discharged instructions with patient and/or family.  Discharge instructions:  You were evaluated for passing out, although no certain cause was found, I suspect possible dysautonomia due to Parkinson's. He should make a follow-up appointment with a neurologist as soon as possible.  Because of grief and possible depression, I do asked that you follow up with your primary care physician for further evaluation and treatment.  Return to the emergency department for any worsening condition including altered mental status, fever, chest pain, dizziness or passing out.  ___________________________________________   FINAL CLINICAL IMPRESSION(S) / ED DIAGNOSES   Final diagnoses:  Syncope, unspecified syncope type  Parkinsons disease ([place])              Note: This dictation was prepared with Dragon dictation. Any transcriptional errors that result from this process are unintentional   Lisa Roca, MD 01/27/16 1545

## 2016-01-27 NOTE — ED Notes (Signed)
Per caregiver, believe pt had seizure a couple days ago, was seen by NP and diagnosed with dehydration, states pt was having chest pain and L arm pain yesterday, no pain on assessment

## 2016-01-29 DIAGNOSIS — I4891 Unspecified atrial fibrillation: Secondary | ICD-10-CM | POA: Diagnosis not present

## 2016-01-29 LAB — COMPREHENSIVE METABOLIC PANEL
ALK PHOS: 60 U/L (ref 38–126)
ALT: 7 U/L — AB (ref 17–63)
ANION GAP: 5 (ref 5–15)
AST: 20 U/L (ref 15–41)
Albumin: 3.2 g/dL — ABNORMAL LOW (ref 3.5–5.0)
BILIRUBIN TOTAL: 0.6 mg/dL (ref 0.3–1.2)
BUN: 13 mg/dL (ref 6–20)
CALCIUM: 8.7 mg/dL — AB (ref 8.9–10.3)
CO2: 25 mmol/L (ref 22–32)
CREATININE: 1.07 mg/dL (ref 0.61–1.24)
Chloride: 108 mmol/L (ref 101–111)
GFR calc non Af Amer: >60 mL/min (ref >60)
Glucose, Bld: 79 mg/dL (ref 65–99)
Potassium: 3.7 mmol/L (ref 3.5–5.1)
Sodium: 138 mmol/L (ref 135–145)
TOTAL PROTEIN: 5.7 g/dL — AB (ref 6.5–8.1)

## 2016-01-29 LAB — CBC WITH DIFFERENTIAL/PLATELET
Basophils Absolute: 0 10*3/uL (ref 0–0.1)
Basophils Relative: 1 %
EOS ABS: 0.2 10*3/uL (ref 0–0.7)
Eosinophils Relative: 4 %
HEMATOCRIT: 33.5 % — AB (ref 40.0–52.0)
HEMOGLOBIN: 11.3 g/dL — AB (ref 13.0–18.0)
LYMPHS ABS: 0.9 10*3/uL — AB (ref 1.0–3.6)
LYMPHS PCT: 19 %
MCH: 34.5 pg — AB (ref 26.0–34.0)
MCHC: 33.7 g/dL (ref 32.0–36.0)
MCV: 102.2 fL — AB (ref 80.0–100.0)
MONOS PCT: 11 %
Monocytes Absolute: 0.5 10*3/uL (ref 0.2–1.0)
NEUTROS ABS: 3 10*3/uL (ref 1.4–6.5)
NEUTROS PCT: 65 %
Platelets: 162 10*3/uL (ref 150–440)
RBC: 3.27 MIL/uL — ABNORMAL LOW (ref 4.40–5.90)
RDW: 13.8 % (ref 11.5–14.5)
WBC: 4.7 10*3/uL (ref 3.8–10.6)

## 2016-01-29 LAB — PROTIME-INR
INR: 2.49
Prothrombin Time: 26.6 seconds — ABNORMAL HIGH (ref 11.4–15.0)

## 2016-01-30 DIAGNOSIS — I4891 Unspecified atrial fibrillation: Secondary | ICD-10-CM | POA: Diagnosis not present

## 2016-01-30 LAB — PROTIME-INR
INR: 1.6
Prothrombin Time: 19.1 seconds — ABNORMAL HIGH (ref 11.4–15.0)

## 2016-01-31 ENCOUNTER — Encounter
Admission: RE | Admit: 2016-01-31 | Discharge: 2016-01-31 | Disposition: A | Discharge status: Home / Self Care | Payer: Medicare Other | Source: Ambulatory Visit | Attending: Internal Medicine | Admitting: Internal Medicine

## 2016-01-31 DIAGNOSIS — R319 Hematuria, unspecified: Secondary | ICD-10-CM | POA: Insufficient documentation

## 2016-02-01 DIAGNOSIS — R319 Hematuria, unspecified: Secondary | ICD-10-CM | POA: Diagnosis not present

## 2016-02-01 LAB — URINALYSIS COMPLETE WITH MICROSCOPIC (ARMC ONLY)
Bilirubin Urine: NEGATIVE
GLUCOSE, UA: NEGATIVE mg/dL
Leukocytes, UA: NEGATIVE
NITRITE: NEGATIVE
PH: 6 (ref 5.0–8.0)
Protein, ur: NEGATIVE mg/dL
Specific Gravity, Urine: 1.013 (ref 1.005–1.030)

## 2016-02-02 DIAGNOSIS — G20 Parkinson's disease: Secondary | ICD-10-CM | POA: Insufficient documentation

## 2016-02-03 LAB — URINE CULTURE

## 2016-02-28 ENCOUNTER — Encounter
Admission: RE | Admit: 2016-02-28 | Discharge: 2016-02-28 | Disposition: A | Discharge status: Home / Self Care | Source: Ambulatory Visit | Attending: Internal Medicine | Admitting: Internal Medicine

## 2016-02-28 DIAGNOSIS — E876 Hypokalemia: Secondary | ICD-10-CM | POA: Insufficient documentation

## 2016-02-28 DIAGNOSIS — D649 Anemia, unspecified: Secondary | ICD-10-CM | POA: Insufficient documentation

## 2016-02-28 DIAGNOSIS — I4891 Unspecified atrial fibrillation: Secondary | ICD-10-CM | POA: Insufficient documentation

## 2016-02-28 DIAGNOSIS — D709 Neutropenia, unspecified: Secondary | ICD-10-CM | POA: Insufficient documentation

## 2016-03-08 DIAGNOSIS — E876 Hypokalemia: Secondary | ICD-10-CM | POA: Diagnosis present

## 2016-03-08 DIAGNOSIS — I4891 Unspecified atrial fibrillation: Secondary | ICD-10-CM | POA: Diagnosis not present

## 2016-03-08 DIAGNOSIS — D649 Anemia, unspecified: Secondary | ICD-10-CM | POA: Diagnosis present

## 2016-03-08 DIAGNOSIS — D709 Neutropenia, unspecified: Secondary | ICD-10-CM | POA: Diagnosis present

## 2016-03-08 LAB — COMPREHENSIVE METABOLIC PANEL
ALBUMIN: 3.5 g/dL (ref 3.5–5.0)
ALK PHOS: 105 U/L (ref 38–126)
ALT: 8 U/L — AB (ref 17–63)
AST: 21 U/L (ref 15–41)
Anion gap: 6 (ref 5–15)
BUN: 11 mg/dL (ref 6–20)
CO2: 28 mmol/L (ref 22–32)
CREATININE: 0.87 mg/dL (ref 0.61–1.24)
Calcium: 8.4 mg/dL — ABNORMAL LOW (ref 8.9–10.3)
Chloride: 105 mmol/L (ref 101–111)
GFR calc Af Amer: >60 mL/min (ref >60)
GFR calc non Af Amer: >60 mL/min (ref >60)
Glucose, Bld: 75 mg/dL (ref 65–99)
Potassium: 2.9 mmol/L — CL (ref 3.5–5.1)
SODIUM: 139 mmol/L (ref 135–145)
Total Bilirubin: 0.8 mg/dL (ref 0.3–1.2)
Total Protein: 6.1 g/dL — ABNORMAL LOW (ref 6.5–8.1)

## 2016-03-08 LAB — CBC WITH DIFFERENTIAL/PLATELET
BASOS PCT: 0 %
Basophils Absolute: 0 10*3/uL (ref 0–0.1)
EOS ABS: 0.1 10*3/uL (ref 0–0.7)
Eosinophils Relative: 4 %
HCT: 30.2 % — ABNORMAL LOW (ref 40.0–52.0)
HEMOGLOBIN: 10.3 g/dL — AB (ref 13.0–18.0)
Lymphocytes Relative: 21 %
Lymphs Abs: 0.5 10*3/uL — ABNORMAL LOW (ref 1.0–3.6)
MCH: 35 pg — ABNORMAL HIGH (ref 26.0–34.0)
MCHC: 34 g/dL (ref 32.0–36.0)
MCV: 102.8 fL — ABNORMAL HIGH (ref 80.0–100.0)
Monocytes Absolute: 0.5 10*3/uL (ref 0.2–1.0)
Monocytes Relative: 21 %
NEUTROS PCT: 54 %
Neutro Abs: 1.2 10*3/uL — ABNORMAL LOW (ref 1.4–6.5)
Platelets: 151 10*3/uL (ref 150–440)
RBC: 2.93 MIL/uL — AB (ref 4.40–5.90)
RDW: 16.1 % — ABNORMAL HIGH (ref 11.5–14.5)
WBC: 2.3 10*3/uL — AB (ref 3.8–10.6)

## 2016-03-12 DIAGNOSIS — D649 Anemia, unspecified: Secondary | ICD-10-CM | POA: Diagnosis not present

## 2016-03-12 LAB — CBC WITH DIFFERENTIAL/PLATELET
BASOS ABS: 0 10*3/uL (ref 0–0.1)
BASOS PCT: 1 %
EOS ABS: 0 10*3/uL (ref 0–0.7)
Eosinophils Relative: 3 %
HEMATOCRIT: 30.7 % — AB (ref 40.0–52.0)
Hemoglobin: 10.4 g/dL — ABNORMAL LOW (ref 13.0–18.0)
Lymphocytes Relative: 25 %
Lymphs Abs: 0.4 10*3/uL — ABNORMAL LOW (ref 1.0–3.6)
MCH: 35.2 pg — ABNORMAL HIGH (ref 26.0–34.0)
MCHC: 34 g/dL (ref 32.0–36.0)
MCV: 103.6 fL — ABNORMAL HIGH (ref 80.0–100.0)
MONO ABS: 0.5 10*3/uL (ref 0.2–1.0)
MONOS PCT: 33 %
NEUTROS ABS: 0.6 10*3/uL — AB (ref 1.4–6.5)
NEUTROS PCT: 38 %
Platelets: 150 10*3/uL (ref 150–440)
RBC: 2.97 MIL/uL — ABNORMAL LOW (ref 4.40–5.90)
RDW: 15.9 % — AB (ref 11.5–14.5)
WBC: 1.5 10*3/uL — ABNORMAL LOW (ref 4.0–10.5)

## 2016-03-12 LAB — BASIC METABOLIC PANEL
Anion gap: 4 — ABNORMAL LOW (ref 5–15)
BUN: 11 mg/dL (ref 6–20)
CO2: 29 mmol/L (ref 22–32)
Calcium: 8.8 mg/dL — ABNORMAL LOW (ref 8.9–10.3)
Chloride: 105 mmol/L (ref 101–111)
Creatinine, Ser: 0.93 mg/dL (ref 0.61–1.24)
GFR calc Af Amer: >60 mL/min (ref >60)
GFR calc non Af Amer: >60 mL/min (ref >60)
Glucose, Bld: 106 mg/dL — ABNORMAL HIGH (ref 65–99)
Potassium: 3.5 mmol/L (ref 3.5–5.1)
Sodium: 138 mmol/L (ref 135–145)

## 2016-03-14 DIAGNOSIS — D649 Anemia, unspecified: Secondary | ICD-10-CM | POA: Diagnosis not present

## 2016-03-14 LAB — COMPREHENSIVE METABOLIC PANEL
ALBUMIN: 3.5 g/dL (ref 3.5–5.0)
ALT: 7 U/L — ABNORMAL LOW (ref 17–63)
ANION GAP: 7 (ref 5–15)
AST: 19 U/L (ref 15–41)
Alkaline Phosphatase: 113 U/L (ref 38–126)
BILIRUBIN TOTAL: 1.1 mg/dL (ref 0.3–1.2)
BUN: 11 mg/dL (ref 6–20)
CALCIUM: 8.7 mg/dL — AB (ref 8.9–10.3)
CO2: 26 mmol/L (ref 22–32)
Chloride: 105 mmol/L (ref 101–111)
Creatinine, Ser: 1.01 mg/dL (ref 0.61–1.24)
GFR calc non Af Amer: >60 mL/min (ref >60)
GLUCOSE: 100 mg/dL — AB (ref 65–99)
POTASSIUM: 3.4 mmol/L — AB (ref 3.5–5.1)
SODIUM: 138 mmol/L (ref 135–145)
TOTAL PROTEIN: 6.2 g/dL — AB (ref 6.5–8.1)

## 2016-03-14 LAB — TSH: TSH: 1.819 u[IU]/mL (ref 0.350–4.500)

## 2016-03-14 LAB — CBC WITH DIFFERENTIAL/PLATELET
Basophils Absolute: 0 10*3/uL (ref 0–0.1)
EOS ABS: 0 10*3/uL (ref 0–0.7)
HCT: 32.3 % — ABNORMAL LOW (ref 40.0–52.0)
Hemoglobin: 11 g/dL — ABNORMAL LOW (ref 13.0–18.0)
LYMPHS ABS: 0.3 10*3/uL — AB (ref 1.0–3.6)
Lymphocytes Relative: 34 %
MCH: 36 pg — AB (ref 26.0–34.0)
MCHC: 34.1 g/dL (ref 32.0–36.0)
MCV: 105.4 fL — ABNORMAL HIGH (ref 80.0–100.0)
MONO ABS: 0.4 10*3/uL (ref 0.2–1.0)
Neutro Abs: 0.3 10*3/uL — ABNORMAL LOW (ref 1.4–6.5)
Neutrophils Relative %: 26 %
Platelets: 140 10*3/uL — ABNORMAL LOW (ref 150–440)
RBC: 3.06 MIL/uL — ABNORMAL LOW (ref 4.40–5.90)
RDW: 16.1 % — AB (ref 11.5–14.5)
WBC: 1 10*3/uL — CL (ref 3.8–10.6)

## 2016-03-15 DIAGNOSIS — D649 Anemia, unspecified: Secondary | ICD-10-CM | POA: Diagnosis not present

## 2016-03-15 LAB — CBC WITH DIFFERENTIAL/PLATELET
BASOS ABS: 0 10*3/uL (ref 0–0.1)
Basophils Relative: 0 %
Eosinophils Absolute: 0 10*3/uL (ref 0–0.7)
Eosinophils Relative: 3 %
HCT: 31.3 % — ABNORMAL LOW (ref 40.0–52.0)
HEMOGLOBIN: 10.6 g/dL — AB (ref 13.0–18.0)
LYMPHS PCT: 38 %
Lymphs Abs: 0.5 10*3/uL — ABNORMAL LOW (ref 1.0–3.6)
MCH: 35.1 pg — ABNORMAL HIGH (ref 26.0–34.0)
MCHC: 34 g/dL (ref 32.0–36.0)
MCV: 103.3 fL — ABNORMAL HIGH (ref 80.0–100.0)
MONOS PCT: 46 %
Monocytes Absolute: 0.6 10*3/uL (ref 0.2–1.0)
Neutro Abs: 0.2 10*3/uL — ABNORMAL LOW (ref 1.4–6.5)
Neutrophils Relative %: 13 %
Platelets: 140 10*3/uL — ABNORMAL LOW (ref 150–440)
RBC: 3.03 MIL/uL — AB (ref 4.40–5.90)
RDW: 16 % — ABNORMAL HIGH (ref 11.5–14.5)
WBC: 1.3 10*3/uL — AB (ref 3.8–10.6)

## 2016-03-15 LAB — BASIC METABOLIC PANEL
Anion gap: 5 (ref 5–15)
BUN: 11 mg/dL (ref 6–20)
CHLORIDE: 103 mmol/L (ref 101–111)
CO2: 25 mmol/L (ref 22–32)
Calcium: 8.2 mg/dL — ABNORMAL LOW (ref 8.9–10.3)
Creatinine, Ser: 0.94 mg/dL (ref 0.61–1.24)
Glucose, Bld: 86 mg/dL (ref 65–99)
POTASSIUM: 3.5 mmol/L (ref 3.5–5.1)
SODIUM: 133 mmol/L — AB (ref 135–145)

## 2016-03-21 DIAGNOSIS — D649 Anemia, unspecified: Secondary | ICD-10-CM | POA: Diagnosis not present

## 2016-03-21 LAB — CBC WITH DIFFERENTIAL/PLATELET
BASOS ABS: 0 10*3/uL (ref 0–0.1)
BASOS PCT: 0 %
EOS PCT: 1 %
Eosinophils Absolute: 0 10*3/uL (ref 0–0.7)
HCT: 35 % — ABNORMAL LOW (ref 40.0–52.0)
Hemoglobin: 12 g/dL — ABNORMAL LOW (ref 13.0–18.0)
Lymphocytes Relative: 18 %
Lymphs Abs: 1 10*3/uL (ref 1.0–3.6)
MCH: 35.4 pg — AB (ref 26.0–34.0)
MCHC: 34.3 g/dL (ref 32.0–36.0)
MCV: 103.2 fL — AB (ref 80.0–100.0)
MONO ABS: 1 10*3/uL (ref 0.2–1.0)
Monocytes Relative: 19 %
Neutro Abs: 3.4 10*3/uL (ref 1.4–6.5)
Neutrophils Relative %: 62 %
PLATELETS: 206 10*3/uL (ref 150–440)
RBC: 3.39 MIL/uL — ABNORMAL LOW (ref 4.40–5.90)
RDW: 14.5 % (ref 11.5–14.5)
WBC: 5.4 10*3/uL (ref 3.8–10.6)

## 2016-03-21 LAB — COMPREHENSIVE METABOLIC PANEL
ALT: 18 U/L (ref 17–63)
ANION GAP: 7 (ref 5–15)
AST: 20 U/L (ref 15–41)
Albumin: 3.5 g/dL (ref 3.5–5.0)
Alkaline Phosphatase: 107 U/L (ref 38–126)
BUN: 13 mg/dL (ref 6–20)
CHLORIDE: 103 mmol/L (ref 101–111)
CO2: 29 mmol/L (ref 22–32)
Calcium: 8.9 mg/dL (ref 8.9–10.3)
Creatinine, Ser: 0.97 mg/dL (ref 0.61–1.24)
Glucose, Bld: 86 mg/dL (ref 65–99)
POTASSIUM: 3.1 mmol/L — AB (ref 3.5–5.1)
Sodium: 139 mmol/L (ref 135–145)
Total Bilirubin: 0.9 mg/dL (ref 0.3–1.2)
Total Protein: 6.2 g/dL — ABNORMAL LOW (ref 6.5–8.1)

## 2016-03-26 DIAGNOSIS — D649 Anemia, unspecified: Secondary | ICD-10-CM | POA: Diagnosis not present

## 2016-03-26 LAB — PROTIME-INR
INR: 1.27
Prothrombin Time: 16 seconds — ABNORMAL HIGH (ref 11.4–15.0)

## 2016-03-30 ENCOUNTER — Encounter
Admission: RE | Admit: 2016-03-30 | Discharge: 2016-03-30 | Disposition: A | Discharge status: Home / Self Care | Source: Ambulatory Visit | Attending: Internal Medicine | Admitting: Internal Medicine

## 2016-03-30 DIAGNOSIS — Z79899 Other long term (current) drug therapy: Secondary | ICD-10-CM | POA: Insufficient documentation

## 2016-03-30 DIAGNOSIS — I482 Chronic atrial fibrillation: Secondary | ICD-10-CM | POA: Insufficient documentation

## 2016-03-30 DIAGNOSIS — R569 Unspecified convulsions: Secondary | ICD-10-CM | POA: Insufficient documentation

## 2016-04-02 DIAGNOSIS — I482 Chronic atrial fibrillation: Secondary | ICD-10-CM | POA: Diagnosis not present

## 2016-04-02 DIAGNOSIS — R569 Unspecified convulsions: Secondary | ICD-10-CM | POA: Diagnosis not present

## 2016-04-02 DIAGNOSIS — Z79899 Other long term (current) drug therapy: Secondary | ICD-10-CM | POA: Diagnosis not present

## 2016-04-02 LAB — PROTIME-INR
INR: 1.34
PROTHROMBIN TIME: 16.7 s — AB (ref 11.4–15.0)

## 2016-04-05 DIAGNOSIS — I482 Chronic atrial fibrillation: Secondary | ICD-10-CM | POA: Diagnosis not present

## 2016-04-05 LAB — URINALYSIS COMPLETE WITH MICROSCOPIC (ARMC ONLY)
BILIRUBIN URINE: NEGATIVE
GLUCOSE, UA: NEGATIVE mg/dL
KETONES UR: NEGATIVE mg/dL
Leukocytes, UA: NEGATIVE
NITRITE: NEGATIVE
PROTEIN: NEGATIVE mg/dL
Specific Gravity, Urine: 1.008 (ref 1.005–1.030)
pH: 8 (ref 5.0–8.0)

## 2016-04-05 LAB — CBC WITH DIFFERENTIAL/PLATELET
BASOS PCT: 0 %
Basophils Absolute: 0 10*3/uL (ref 0–0.1)
EOS ABS: 0 10*3/uL (ref 0–0.7)
EOS PCT: 0 %
HCT: 37.3 % — ABNORMAL LOW (ref 40.0–52.0)
Hemoglobin: 12.6 g/dL — ABNORMAL LOW (ref 13.0–18.0)
LYMPHS ABS: 0.4 10*3/uL — AB (ref 1.0–3.6)
Lymphocytes Relative: 4 %
MCH: 34.4 pg — AB (ref 26.0–34.0)
MCHC: 33.9 g/dL (ref 32.0–36.0)
MCV: 101.3 fL — ABNORMAL HIGH (ref 80.0–100.0)
MONO ABS: 0.5 10*3/uL (ref 0.2–1.0)
MONOS PCT: 6 %
Neutro Abs: 7.8 10*3/uL — ABNORMAL HIGH (ref 1.4–6.5)
Neutrophils Relative %: 90 %
PLATELETS: 143 10*3/uL — AB (ref 150–440)
RBC: 3.68 MIL/uL — ABNORMAL LOW (ref 4.40–5.90)
RDW: 14 % (ref 11.5–14.5)
WBC: 8.7 10*3/uL (ref 3.8–10.6)

## 2016-04-05 LAB — COMPREHENSIVE METABOLIC PANEL
ALK PHOS: 83 U/L (ref 38–126)
ALT: 7 U/L — AB (ref 17–63)
AST: 19 U/L (ref 15–41)
Albumin: 3.5 g/dL (ref 3.5–5.0)
Anion gap: 5 (ref 5–15)
BUN: 17 mg/dL (ref 6–20)
CALCIUM: 8.7 mg/dL — AB (ref 8.9–10.3)
CHLORIDE: 100 mmol/L — AB (ref 101–111)
CO2: 31 mmol/L (ref 22–32)
CREATININE: 0.83 mg/dL (ref 0.61–1.24)
GFR calc Af Amer: >60 mL/min (ref >60)
Glucose, Bld: 86 mg/dL (ref 65–99)
Potassium: 3.4 mmol/L — ABNORMAL LOW (ref 3.5–5.1)
Sodium: 136 mmol/L (ref 135–145)
Total Bilirubin: 0.9 mg/dL (ref 0.3–1.2)
Total Protein: 5.8 g/dL — ABNORMAL LOW (ref 6.5–8.1)

## 2016-04-05 LAB — PROTIME-INR
INR: 2.08
Prothrombin Time: 23.2 seconds — ABNORMAL HIGH (ref 11.4–15.0)

## 2016-04-06 LAB — URINE CULTURE

## 2016-04-06 LAB — PROLACTIN: PROLACTIN: 9.8 ng/mL (ref 4.0–15.2)

## 2016-04-09 DIAGNOSIS — I482 Chronic atrial fibrillation: Secondary | ICD-10-CM | POA: Diagnosis not present

## 2016-04-09 LAB — URINALYSIS COMPLETE WITH MICROSCOPIC (ARMC ONLY)
Bilirubin Urine: NEGATIVE
Glucose, UA: NEGATIVE mg/dL
Ketones, ur: NEGATIVE mg/dL
Leukocytes, UA: NEGATIVE
Nitrite: NEGATIVE
PROTEIN: NEGATIVE mg/dL
SQUAMOUS EPITHELIAL / LPF: NONE SEEN
Specific Gravity, Urine: 1.012 (ref 1.005–1.030)
pH: 7 (ref 5.0–8.0)

## 2016-04-11 DIAGNOSIS — I482 Chronic atrial fibrillation: Secondary | ICD-10-CM | POA: Diagnosis not present

## 2016-04-11 LAB — PROTIME-INR
INR: 1.17
PROTHROMBIN TIME: 15.1 s — AB (ref 11.4–15.0)

## 2016-04-11 LAB — URINE CULTURE

## 2016-04-15 DIAGNOSIS — I482 Chronic atrial fibrillation: Secondary | ICD-10-CM | POA: Diagnosis not present

## 2016-04-15 LAB — PROTIME-INR
INR: 1.24
PROTHROMBIN TIME: 15.8 s — AB (ref 11.4–15.0)

## 2016-04-18 DIAGNOSIS — I482 Chronic atrial fibrillation: Secondary | ICD-10-CM | POA: Diagnosis not present

## 2016-04-18 LAB — PROTIME-INR
INR: 1.47
Prothrombin Time: 17.9 seconds — ABNORMAL HIGH (ref 11.4–15.0)

## 2016-04-22 DIAGNOSIS — I482 Chronic atrial fibrillation: Secondary | ICD-10-CM | POA: Diagnosis not present

## 2016-04-22 LAB — PROTIME-INR
INR: 1.59
PROTHROMBIN TIME: 19 s — AB (ref 11.4–15.0)

## 2016-04-25 DIAGNOSIS — I482 Chronic atrial fibrillation: Secondary | ICD-10-CM | POA: Diagnosis not present

## 2016-04-25 LAB — PROTIME-INR
INR: 2.35
PROTHROMBIN TIME: 25.5 s — AB (ref 11.4–15.0)

## 2016-04-29 ENCOUNTER — Encounter
Admission: RE | Admit: 2016-04-29 | Discharge: 2016-04-29 | Disposition: A | Discharge status: Home / Self Care | Source: Ambulatory Visit | Attending: Internal Medicine | Admitting: Internal Medicine

## 2016-04-29 DIAGNOSIS — I482 Chronic atrial fibrillation: Secondary | ICD-10-CM | POA: Insufficient documentation

## 2016-04-30 DIAGNOSIS — I482 Chronic atrial fibrillation: Secondary | ICD-10-CM | POA: Diagnosis present

## 2016-04-30 LAB — PROTIME-INR
INR: 2.65
PROTHROMBIN TIME: 27.9 s — AB (ref 11.4–15.0)

## 2016-05-07 DIAGNOSIS — I482 Chronic atrial fibrillation: Secondary | ICD-10-CM | POA: Diagnosis not present

## 2016-05-07 LAB — PROTIME-INR
INR: 2.32
Prothrombin Time: 25.2 seconds — ABNORMAL HIGH (ref 11.4–15.0)

## 2016-05-14 DIAGNOSIS — I482 Chronic atrial fibrillation: Secondary | ICD-10-CM | POA: Diagnosis not present

## 2016-05-14 LAB — PROTIME-INR
INR: 2.7
Prothrombin Time: 28.3 seconds — ABNORMAL HIGH (ref 11.4–15.0)

## 2016-05-21 DIAGNOSIS — I482 Chronic atrial fibrillation: Secondary | ICD-10-CM | POA: Diagnosis not present

## 2016-05-21 LAB — PROTIME-INR
INR: 2.37
PROTHROMBIN TIME: 25.6 s — AB (ref 11.4–15.0)

## 2016-05-28 DIAGNOSIS — I482 Chronic atrial fibrillation: Secondary | ICD-10-CM | POA: Diagnosis not present

## 2016-05-28 LAB — PROTIME-INR
INR: 2.37
PROTHROMBIN TIME: 25.6 s — AB (ref 11.4–15.0)

## 2016-05-30 ENCOUNTER — Encounter
Admission: RE | Admit: 2016-05-30 | Discharge: 2016-05-30 | Disposition: A | Discharge status: Home / Self Care | Source: Ambulatory Visit | Attending: Internal Medicine | Admitting: Internal Medicine

## 2016-05-30 DIAGNOSIS — I482 Chronic atrial fibrillation: Secondary | ICD-10-CM | POA: Insufficient documentation

## 2016-06-04 DIAGNOSIS — I482 Chronic atrial fibrillation: Secondary | ICD-10-CM | POA: Diagnosis present

## 2016-06-04 LAB — PROTIME-INR
INR: 2.16
PROTHROMBIN TIME: 23.9 s — AB (ref 11.4–15.0)

## 2016-06-11 DIAGNOSIS — I482 Chronic atrial fibrillation: Secondary | ICD-10-CM | POA: Diagnosis not present

## 2016-06-11 LAB — PROTIME-INR
INR: 1.2
PROTHROMBIN TIME: 15.4 s — AB (ref 11.4–15.0)

## 2016-06-29 ENCOUNTER — Encounter
Admission: RE | Admit: 2016-06-29 | Discharge: 2016-06-29 | Disposition: A | Discharge status: Home / Self Care | Payer: Medicare Other | Source: Ambulatory Visit | Attending: Internal Medicine | Admitting: Internal Medicine

## 2016-07-12 ENCOUNTER — Non-Acute Institutional Stay (SKILLED_NURSING_FACILITY): Payer: Medicare Other | Admitting: Gerontology

## 2016-07-12 DIAGNOSIS — G20 Parkinson's disease: Secondary | ICD-10-CM

## 2016-07-12 NOTE — Progress Notes (Signed)
Location:  [facility] Service:  SNF [phone]) Provider:  Toni Arthurs, NP-C  [name],[name] D, MD  Patient Care Team: Idelle Crouch, MD as PCP - General (Internal Medicine)  Extended Emergency Contact Information Primary Emergency Contact: [name] [name] (Daughter)  [place] Phone: [phone] Relation: None  Code Status:  DNR Goals of care: Advanced Directive information Advanced Directives 01/27/2016  Does patient have an advance directive? No  Type of Advance Directive -     Chief Complaint  Patient presents with  . Medical Management of Chronic Issues    HPI:  Pt is a 82 y.o. male seen today for medical management of chronic diseases. He is a Hospice pt with a diagnosis of Parkinson's Disease. He reports his tremors are improved most of the time, but still difficult to eat. He appears to be more depressed at the mention of having to depend on someone else for everything he does. His tremors worsened when talking about this subject. They lessened to tolerable when he relaxed.     Past Medical History  Diagnosis Date  . Atrial fibrillation ([place])   . Parkinson's disease ([place])   . CHF (congestive heart failure) ([place])   . Prostate cancer ([place])   . Vertigo   . Psoriasis    No past surgical history on file.  Allergies  Allergen Reactions  . Penicillins       Medication List       This list is accurate as of: 07/12/16  5:14 PM.  Always use your most recent med list.               acetaminophen 500 MG tablet  Commonly known as:  TYLENOL  Take 500 mg by mouth 2 (two) times daily.     alfuzosin 10 MG 24 hr tablet  Commonly known as:  UROXATRAL  Take 10 mg by mouth daily.     carbidopa-levodopa 25-100 MG tablet  Commonly known as:  SINEMET IR  Take 1.5 tablets by mouth 4 (four) times daily.     chlordiazePOXIDE 10 MG capsule  Commonly known as:  LIBRIUM  Take 10 mg by mouth daily.     cholecalciferol 1000  units tablet  Commonly known as:  VITAMIN D  Take 1,000 Units by mouth daily.     clonazePAM 1 MG tablet  Commonly known as:  KLONOPIN  Take 1 mg by mouth every evening.     fenofibrate 145 MG tablet  Commonly known as:  TRICOR  Take 145 mg by mouth daily.     folic acid 1 MG tablet  Commonly known as:  FOLVITE  Take 1 mg by mouth daily.     hydrochlorothiazide 12.5 MG capsule  Commonly known as:  MICROZIDE  Take 12.5 mg by mouth 3 (three) times a week.     Melatonin 3 MG Caps  Take 3 mg by mouth every evening.     methotrexate 2.5 MG tablet  Take 12.5 mg by mouth once a week. On Wednesday with a meal     predniSONE 2.5 MG tablet  Commonly known as:  DELTASONE  Take 2.5 mg by mouth daily with breakfast.     ropinirole 5 MG tablet  Commonly known as:  REQUIP  Take 5 mg by mouth QID.     vitamin B-12 1000 MCG tablet  Commonly known as:  CYANOCOBALAMIN  Take 1,000 mcg by mouth daily.     warfarin 5  MG tablet  Commonly known as:  COUMADIN  Take 5 mg by mouth daily. On Sunday, Tuesday, and Thursday at 6pm     warfarin 2.5 MG tablet  Commonly known as:  COUMADIN  Take 2.5 mg by mouth daily. On Monday, Wednesday, Friday, Saturday        Review of Systems  Constitutional: Positive for fatigue.  HENT: Negative.   Respiratory: Negative for cough, chest tightness and shortness of breath.   Cardiovascular: Negative for chest pain.  Gastrointestinal: Negative.   Genitourinary: Negative.   Musculoskeletal: Positive for arthralgias and gait problem.  Skin: Negative.   Neurological: Positive for tremors, speech difficulty and weakness.  Psychiatric/Behavioral: Negative.   All other systems reviewed and are negative.    There is no immunization history on file for this patient. Pertinent  Health Maintenance Due  Topic Date Due  . PNA vac Low Risk Adult (1 of 2 - PCV13) 05/28/1997  . INFLUENZA VACCINE  07/30/2016   No flowsheet data found. Functional Status  Survey:    Filed Vitals:   07/12/16 1712  BP: 109/61  Pulse: 90  Temp: 97.8 F (36.6 C)  Resp: 18  SpO2: 95%   There is no weight on file to calculate BMI. Physical Exam  Constitutional: He is oriented to person, place, and time. He appears well-developed and well-nourished. No distress.  HENT:  Head: Normocephalic and atraumatic.  Eyes: Conjunctivae and EOM are normal. Pupils are equal, round, and reactive to light.  Neck: Normal range of motion. Neck supple. No JVD present.  Cardiovascular: Normal rate, regular rhythm, normal heart sounds and intact distal pulses.  Exam reveals no gallop and no friction rub.   No murmur heard. Pulmonary/Chest: Effort normal and breath sounds normal. No respiratory distress. He has no wheezes. He has no rales. He exhibits no tenderness.  Abdominal: Soft. Bowel sounds are normal. He exhibits no distension. There is no tenderness.  Musculoskeletal: He exhibits no edema or tenderness.  Neurological: He is alert and oriented to person, place, and time. He exhibits abnormal muscle tone. Coordination (Parkinson's) abnormal.  Skin: Skin is warm. No rash noted. He is not diaphoretic. No erythema. No pallor.  Psychiatric: He has a normal mood and affect. His behavior is normal. Judgment and thought content normal.  Nursing note and vitals reviewed.   Labs reviewed:  Recent Labs  01/24/16 1455  03/15/16 1100 03/21/16 0929 04/05/16 1157  NA 137  < > 133* 139 136  K 4.5  < > 3.5 3.1* 3.4*  CL 98*  < > 103 103 100*  CO2 24  < > 25 29 31   GLUCOSE 82  < > 86 86 86  BUN 32*  < > 11 13 17   CREATININE 1.72*  < > 0.94 0.97 0.83  CALCIUM 9.8  < > 8.2* 8.9 8.7*  MG 1.7  --   --   --   --   < > = values in this interval not displayed.  Recent Labs  03/14/16 1030 03/21/16 0929 04/05/16 1157  AST 19 20 19   ALT 7* 18 7*  ALKPHOS 113 107 83  BILITOT 1.1 0.9 0.9  PROT 6.2* 6.2* 5.8*  ALBUMIN 3.5 3.5 3.5    Recent Labs  03/15/16 0900  03/21/16 0929 04/05/16 1157  WBC 1.3* 5.4 8.7  NEUTROABS 0.2* 3.4 7.8*  HGB 10.6* 12.0* 12.6*  HCT 31.3* 35.0* 37.3*  MCV 103.3* 103.2* 101.3*  PLT 140* 206 143*   Lab Results  Component Value Date   TSH 1.819 03/14/2016   No results found for: HGBA1C Lab Results  Component Value Date   CHOL 136 11/29/2015   HDL 44 11/29/2015   LDLCALC 80 11/29/2015   TRIG 59 11/29/2015   CHOLHDL 3.1 11/29/2015    Significant Diagnostic Results in last 30 days:  No results found.  Assessment/Plan 1. Parkinson's disease ([place])  Continue Baclofen 5 mg po TID- tremors  Monitor weights  Assist with meals/ feedings with each meal    Family/ staff Communication:   Total Time: 40 minutes  Documentation: 10 minutes  Face to Face: 30 minutes  Family/Phone:   Labs/tests ordered:  none   Vikki Ports, NP-C [facility] [address], [place] [address] Cell Phone (Mon-Fri 8am-5pm):  [phone] On Call:  [phone] & follow prompts after 5pm & weekends Office Phone:  [phone] Office Fax:  [phone]

## 2016-07-28 ENCOUNTER — Emergency Department
Admission: EM | Admit: 2016-07-28 | Discharge: 2016-07-28 | Disposition: A | Discharge status: Home / Self Care | Attending: Emergency Medicine | Admitting: Emergency Medicine

## 2016-07-28 ENCOUNTER — Emergency Department

## 2016-07-28 ENCOUNTER — Encounter: Payer: Self-pay | Admitting: Emergency Medicine

## 2016-07-28 DIAGNOSIS — Z7982 Long term (current) use of aspirin: Secondary | ICD-10-CM | POA: Insufficient documentation

## 2016-07-28 DIAGNOSIS — F320 Major depressive disorder, single episode, mild: Secondary | ICD-10-CM | POA: Diagnosis not present

## 2016-07-28 DIAGNOSIS — Z79899 Other long term (current) drug therapy: Secondary | ICD-10-CM | POA: Insufficient documentation

## 2016-07-28 DIAGNOSIS — Z8546 Personal history of malignant neoplasm of prostate: Secondary | ICD-10-CM | POA: Diagnosis not present

## 2016-07-28 DIAGNOSIS — Z85828 Personal history of other malignant neoplasm of skin: Secondary | ICD-10-CM | POA: Diagnosis not present

## 2016-07-28 DIAGNOSIS — J189 Pneumonia, unspecified organism: Secondary | ICD-10-CM | POA: Diagnosis not present

## 2016-07-28 DIAGNOSIS — I509 Heart failure, unspecified: Secondary | ICD-10-CM | POA: Insufficient documentation

## 2016-07-28 DIAGNOSIS — I4891 Unspecified atrial fibrillation: Secondary | ICD-10-CM | POA: Diagnosis not present

## 2016-07-28 DIAGNOSIS — G20 Parkinson's disease: Secondary | ICD-10-CM | POA: Diagnosis not present

## 2016-07-28 DIAGNOSIS — Z8673 Personal history of transient ischemic attack (TIA), and cerebral infarction without residual deficits: Secondary | ICD-10-CM | POA: Diagnosis not present

## 2016-07-28 DIAGNOSIS — G309 Alzheimer's disease, unspecified: Secondary | ICD-10-CM | POA: Diagnosis not present

## 2016-07-28 DIAGNOSIS — Z791 Long term (current) use of non-steroidal anti-inflammatories (NSAID): Secondary | ICD-10-CM | POA: Insufficient documentation

## 2016-07-28 DIAGNOSIS — M25512 Pain in left shoulder: Secondary | ICD-10-CM | POA: Diagnosis not present

## 2016-07-28 DIAGNOSIS — R0989 Other specified symptoms and signs involving the circulatory and respiratory systems: Secondary | ICD-10-CM

## 2016-07-28 DIAGNOSIS — R0602 Shortness of breath: Secondary | ICD-10-CM | POA: Diagnosis present

## 2016-07-28 DIAGNOSIS — Z7952 Long term (current) use of systemic steroids: Secondary | ICD-10-CM | POA: Diagnosis not present

## 2016-07-28 DIAGNOSIS — E785 Hyperlipidemia, unspecified: Secondary | ICD-10-CM | POA: Insufficient documentation

## 2016-07-28 LAB — COMPREHENSIVE METABOLIC PANEL
ALK PHOS: 81 U/L (ref 38–126)
ALT: <5 U/L — ABNORMAL LOW (ref 17–63)
ANION GAP: 6 (ref 5–15)
AST: 14 U/L — ABNORMAL LOW (ref 15–41)
Albumin: 3.7 g/dL (ref 3.5–5.0)
BUN: 14 mg/dL (ref 6–20)
CALCIUM: 8.9 mg/dL (ref 8.9–10.3)
CO2: 30 mmol/L (ref 22–32)
Chloride: 103 mmol/L (ref 101–111)
Creatinine, Ser: 0.96 mg/dL (ref 0.61–1.24)
GFR calc non Af Amer: >60 mL/min (ref >60)
GLUCOSE: 89 mg/dL (ref 65–99)
POTASSIUM: 3.4 mmol/L — AB (ref 3.5–5.1)
SODIUM: 139 mmol/L (ref 135–145)
Total Bilirubin: 1.1 mg/dL (ref 0.3–1.2)
Total Protein: 6 g/dL — ABNORMAL LOW (ref 6.5–8.1)

## 2016-07-28 LAB — PROTIME-INR
INR: 1.13
PROTHROMBIN TIME: 14.6 s (ref 11.4–15.2)

## 2016-07-28 LAB — URINALYSIS COMPLETE WITH MICROSCOPIC (ARMC ONLY)
Bacteria, UA: NONE SEEN
Bilirubin Urine: NEGATIVE
Glucose, UA: NEGATIVE mg/dL
HGB URINE DIPSTICK: NEGATIVE
Leukocytes, UA: NEGATIVE
Nitrite: NEGATIVE
PROTEIN: NEGATIVE mg/dL
SPECIFIC GRAVITY, URINE: 1.017 (ref 1.005–1.030)
SQUAMOUS EPITHELIAL / LPF: NONE SEEN
pH: 5 (ref 5.0–8.0)

## 2016-07-28 LAB — CBC WITH DIFFERENTIAL/PLATELET
Basophils Absolute: 0.2 10*3/uL — ABNORMAL HIGH (ref 0–0.1)
Basophils Relative: 2 %
EOS ABS: 0.2 10*3/uL (ref 0–0.7)
EOS PCT: 3 %
HCT: 34.5 % — ABNORMAL LOW (ref 40.0–52.0)
HEMOGLOBIN: 12 g/dL — AB (ref 13.0–18.0)
LYMPHS ABS: 0.7 10*3/uL — AB (ref 1.0–3.6)
Lymphocytes Relative: 9 %
MCH: 35.3 pg — AB (ref 26.0–34.0)
MCHC: 34.7 g/dL (ref 32.0–36.0)
MCV: 101.8 fL — ABNORMAL HIGH (ref 80.0–100.0)
MONO ABS: 0.8 10*3/uL (ref 0.2–1.0)
MONOS PCT: 11 %
NEUTROS PCT: 75 %
Neutro Abs: 5.6 10*3/uL (ref 1.4–6.5)
PLATELETS: 173 10*3/uL (ref 150–440)
RBC: 3.39 MIL/uL — ABNORMAL LOW (ref 4.40–5.90)
RDW: 12.7 % (ref 11.5–14.5)
WBC: 7.4 10*3/uL (ref 3.8–10.6)

## 2016-07-28 LAB — TROPONIN I: Troponin I: <0.03 ng/mL (ref <0.03)

## 2016-07-28 NOTE — ED Notes (Signed)
Bladder scan with 665 mL. Dr. Corky Downs informed

## 2016-07-28 NOTE — ED Provider Notes (Signed)
[facility] Emergency Department Provider Note   ____________________________________________    I have reviewed the triage vital signs and the nursing notes.   HISTORY  Chief Complaint Shortness of Breath     HPI [name] [name] is a 82 y.o. male who was sent from [facility] for questionable shoulder pain but also shortness of breath. The patient reports mild left shoulder pain but denies injury. He also says that he does not feel short of breath. He denies chest pain. He denies cough. He says that he would not come to the emergency department if he had a choice. He denies leg pain or swelling. He denies fall. No abdominal pain, nausea or vomiting   Past Medical History:  Diagnosis Date  . Atrial fibrillation ([place])   . CHF (congestive heart failure) ([place])   . Parkinson's disease ([place])   . Prostate cancer ([place])   . Psoriasis   . Vertigo     Patient Active Problem List   Diagnosis Date Noted  . Absolute anemia 12/18/2015  . CA of skin 12/18/2015  . Head revolving around 12/18/2015  . Depression, major, single episode, mild ([place]) 11/29/2015  . Alzheimer's disease 11/29/2015  . CVA (cerebral infarction) 11/28/2015  . Excessive salivation 04/14/2015  . Abnormal rapid eye movement sleep 01/21/2015  . Degeneration of intervertebral disc of lumbar region 01/04/2015  . Neuritis or radiculitis due to rupture of lumbar intervertebral disc 01/04/2015  . Atrial fibrillation ([place]) 08/01/2014  . Anxiety 05/11/2014  . Congestive heart failure ([place]) 05/11/2014  . HLD (hyperlipidemia) 05/11/2014  . Calculus of kidney 05/11/2014  . Parkinson's disease ([place]) 05/11/2014  . Peripheral nerve disease ([place]) 05/11/2014  . [place]ison anemia 05/11/2014  . Malignant neoplasm of prostate ([place]) 05/11/2014  . Psoriatic arthritis ([place]) 05/11/2014  . Disturbance in sleep behavior 05/11/2014  . Spinal stenosis 05/11/2014  . Gastrointestinal bleeding, upper  05/11/2014  . Heart valve disease 05/11/2014  . Long term current use of anticoagulant 05/02/2014    No past surgical history on file.  Prior to Admission medications   Medication Sig Start Date End Date Taking? Authorizing Provider  acetaminophen (TYLENOL) 500 MG tablet Take 500 mg by mouth 2 (two) times daily.    Historical Provider, MD  alfuzosin (UROXATRAL) 10 MG 24 hr tablet Take 10 mg by mouth daily.    Historical Provider, MD  carbidopa-levodopa (SINEMET IR) 25-100 MG tablet Take 1.5 tablets by mouth 4 (four) times daily. 11/29/15   Vaughan Basta, MD  chlordiazePOXIDE (LIBRIUM) 10 MG capsule Take 10 mg by mouth daily.     Historical Provider, MD  cholecalciferol (VITAMIN D) 1000 UNITS tablet Take 1,000 Units by mouth daily.    Historical Provider, MD  clonazePAM (KLONOPIN) 1 MG tablet Take 1 mg by mouth every evening.    Historical Provider, MD  fenofibrate (TRICOR) 145 MG tablet Take 145 mg by mouth daily.    Historical Provider, MD  folic acid (FOLVITE) 1 MG tablet Take 1 mg by mouth daily.    Historical Provider, MD  hydrochlorothiazide (MICROZIDE) 12.5 MG capsule Take 12.5 mg by mouth 3 (three) times a week.    Historical Provider, MD  Melatonin 3 MG CAPS Take 3 mg by mouth every evening.    Historical Provider, MD  methotrexate 2.5 MG tablet Take 12.5 mg by mouth once a week. On Wednesday with a meal    Historical Provider, MD  predniSONE (DELTASONE) 2.5 MG tablet Take 2.5 mg  by mouth daily with breakfast. 11/21/15   Historical Provider, MD  ropinirole (REQUIP) 5 MG tablet Take 5 mg by mouth QID.    Historical Provider, MD  vitamin B-12 (CYANOCOBALAMIN) 1000 MCG tablet Take 1,000 mcg by mouth daily.    Historical Provider, MD  warfarin (COUMADIN) 2.5 MG tablet Take 2.5 mg by mouth daily. On Monday, Wednesday, Friday, Saturday    Historical Provider, MD  warfarin (COUMADIN) 5 MG tablet Take 5 mg by mouth daily. On Sunday, Tuesday, and Thursday at [place] Provider,  MD  .   Allergies Penicillins  Family History  Problem Relation Age of Onset  . Hypertension Mother     Social History Social History  Substance Use Topics  . Smoking status: Never Smoker  . Smokeless tobacco: Not on file  . Alcohol use Not on file    Review of Systems  Constitutional: No chills   Cardiovascular: Denies chest pain. Respiratory: Denies shortness of breath. Gastrointestinal: No abdominal pain.  No nausea, no vomiting.    Musculoskeletal: Negative forHip pain or back pain Skin: Negative for injury Neurological: Negative for headaches or new weakness  10-point ROS otherwise negative.  ____________________________________________   PHYSICAL EXAM:  VITAL SIGNS: ED Triage Vitals  Enc Vitals Group     BP 07/28/16 0728 127/65     Pulse Rate 07/28/16 0728 77     Resp 07/28/16 0728 17     Temp 07/28/16 0728 98.2 F (36.8 C)     Temp Source 07/28/16 0728 Oral     SpO2 07/28/16 0724 (!) 87 %     Weight 07/28/16 0728 170 lb (77.1 kg)     Height 07/28/16 0728 5\' 8"  (1.727 m)     Head Circumference --      Peak Flow --      Pain Score 07/28/16 0732 2     Pain Loc --      Pain Edu? --      Excl. in [place]? --     Constitutional: Alert. No acute distress. Eyes: Conjunctivae are normal.  Head: Atraumatic. Nose: No Evidence of injury or swelling Mouth/Throat: Mucous membranes are dry Neck:  Painless ROM Cardiovascular: Normal rate, irregularly irregular rhythm. Grossly normal heart sounds.  Good peripheral circulation. Respiratory: Normal respiratory effort.  No retractions. Lungs CTAB. Gastrointestinal: Soft and nontender. No distention.  No CVA tenderness. Genitourinary: deferred Musculoskeletal: No lower extremity tenderness nor edema.  Warm and well perfused. No vertebral tenderness to palpation, no pain with axial load of both hips. Left shoulder with normal range of motion without pain, no bony abnormality or evidence of dislocation Neurologic:   Normal speech and language. No gross focal neurologic deficits are appreciated.  Skin:  Skin is warm, dry and intact. No rash noted. Psychiatric: Mood and affect are normal. Speech and behavior are normal.  ____________________________________________   LABS (all labs ordered are listed, but only abnormal results are displayed)  Labs Reviewed  CBC WITH DIFFERENTIAL/PLATELET  COMPREHENSIVE METABOLIC PANEL  TROPONIN I  PROTIME-INR   ____________________________________________  EKG  ED ECG REPORT I, Lavonia Drafts, the attending physician, personally viewed and interpreted this ECG.   Date: 07/28/2016  EKG Time: 7:23 AM  Rate: 73  Rhythm: atrial fibrillation, rate 73  Axis: Normal  Intervals:none  ST&T Change: Nonspecific  ____________________________________________  RADIOLOGY  Mild pulmonary vascular congestion ____________________________________________   PROCEDURES  Procedure(s) performed: No    Critical Care performed: No ____________________________________________   INITIAL IMPRESSION /  ASSESSMENT AND PLAN / ED COURSE  Pertinent labs & imaging results that were available during my care of the patient were reviewed by me and considered in my medical decision making (see chart for details).  Patient presents with question of shortness of breath and questionable shoulder pain, we will image his chest and his left shoulder area and we will obtain labs, EKG try to gain more information from the [facility] and reevaluate.  Clinical Course  ----------------------------------------- 9:46 AM on 07/28/2016 -----------------------------------------  Lab work is reassuring. I discussed with staff at [facility] he was sent to the emergency department for an elevated heart rate and elevated blood pressure which is resolved without specific treatment here. Patient has been observed and is quite comfortable. Feel he is appropriate for  discharge ____________________________________________   FINAL CLINICAL IMPRESSION(S) / ED DIAGNOSES  Final diagnoses:  Shoulder pain, acute, left  Pulmonary vascular congestion      NEW MEDICATIONS STARTED DURING THIS VISIT:  New Prescriptions   No medications on file     Note:  This document was prepared using Dragon voice recognition software and may include unintentional dictation errors.    Lavonia Drafts, MD 07/28/16 [phone]

## 2016-07-28 NOTE — ED Notes (Signed)
Patient daughter, Peter Congo called, Patient gave verbal OK to speak with Peter Congo. Peter Congo asked for update on patient. Informed her that patient is being discharged back to [facility] and that labs, EKG and shoulder x-ray came back ok and that chest x-ray showed pulmonary vascular congestion and patient needed to follow up with his PCP. Peter Congo states understanding.

## 2016-07-28 NOTE — ED Triage Notes (Signed)
Patient brought in by [facility] from [facility] for shortness of breath and left shoulder pain. Per EMS patients room air O2 sats were 87-88%, patient placed on 2 liter O2 via [place] and sats improved to 97%. Patient is currently denying any shortness of breath but states that he does have left shoulder pain. Patient states that he was given Morphine at [facility] [place].

## 2016-07-28 NOTE — ED Notes (Addendum)
Spoke with Asencion Partridge at [facility] to give report on patient. Informed her that patient was ready for discharge and would be sent back to [facility] via EMS. Informed her that patient has remained stable while here and that his blood work was negative, left shoulder x-ray was negative and his chest x-ray showed pulmonary vascular congestion and that patient needed to follow up with his MD.  Asencion Partridge asked if a bladder scan or UA was performed on patient. I informed her that there was no UA or bladder scan done because there were no urinary complaints. Per Asencion Partridge the night nurse reported to her that patient had not passed any urine on her shift. I informed Asencion Partridge that I would informed Dr. Corky Downs.

## 2016-07-30 ENCOUNTER — Encounter
Admission: RE | Admit: 2016-07-30 | Discharge: 2016-07-30 | Disposition: A | Discharge status: Home / Self Care | Source: Ambulatory Visit | Attending: Internal Medicine | Admitting: Internal Medicine

## 2016-07-30 DIAGNOSIS — R300 Dysuria: Secondary | ICD-10-CM | POA: Insufficient documentation

## 2016-07-30 LAB — URINALYSIS COMPLETE WITH MICROSCOPIC (ARMC ONLY)
BACTERIA UA: NONE SEEN
Bilirubin Urine: NEGATIVE
Glucose, UA: NEGATIVE mg/dL
NITRITE: NEGATIVE
PROTEIN: 30 mg/dL — AB
SPECIFIC GRAVITY, URINE: 1.014 (ref 1.005–1.030)
Squamous Epithelial / LPF: NONE SEEN
pH: 5 (ref 5.0–8.0)

## 2016-08-03 LAB — URINE CULTURE

## 2016-08-28 ENCOUNTER — Non-Acute Institutional Stay (SKILLED_NURSING_FACILITY): Payer: Medicare Other | Admitting: Gerontology

## 2016-08-28 DIAGNOSIS — I5020 Unspecified systolic (congestive) heart failure: Secondary | ICD-10-CM

## 2016-08-28 DIAGNOSIS — I4891 Unspecified atrial fibrillation: Secondary | ICD-10-CM

## 2016-08-28 DIAGNOSIS — G20 Parkinson's disease: Secondary | ICD-10-CM | POA: Diagnosis not present

## 2016-08-30 ENCOUNTER — Encounter
Admission: RE | Admit: 2016-08-30 | Discharge: 2016-08-30 | Disposition: A | Discharge status: Home / Self Care | Payer: Medicare Other | Source: Ambulatory Visit | Attending: Internal Medicine | Admitting: Internal Medicine

## 2016-08-30 ENCOUNTER — Encounter
Admission: RE | Admit: 2016-08-30 | Payer: Medicare Other | Source: Ambulatory Visit | Attending: Internal Medicine | Admitting: Internal Medicine

## 2016-08-30 DIAGNOSIS — E876 Hypokalemia: Secondary | ICD-10-CM | POA: Insufficient documentation

## 2016-08-30 DIAGNOSIS — D709 Neutropenia, unspecified: Secondary | ICD-10-CM | POA: Insufficient documentation

## 2016-08-30 DIAGNOSIS — I4891 Unspecified atrial fibrillation: Secondary | ICD-10-CM | POA: Insufficient documentation

## 2016-08-30 DIAGNOSIS — D649 Anemia, unspecified: Secondary | ICD-10-CM | POA: Insufficient documentation

## 2016-08-31 NOTE — Progress Notes (Signed)
Location:      Place of Service:  SNF (31) Provider:  Toni Arthurs, NP-C  SPARKS,JEFFREY D, MD  Patient Care Team: Idelle Crouch, MD as PCP - General (Internal Medicine)  Extended Emergency Contact Information Primary Emergency Contact: Derl Barrow (Daughter)  [place] Phone: [phone] Relation: None  Code Status:  DNR Goals of care: Advanced Directive information Advanced Directives 07/28/2016  Does patient have an advance directive? Yes  Type of Advance Directive Out of facility DNR (pink MOST or yellow form)  Does patient want to make changes to advanced directive? No - Patient declined  Copy of advanced directive(s) in chart? Yes  Pre-existing out of facility DNR order (yellow form or pink MOST form) Yellow form placed in chart (order not valid for inpatient use)     Chief Complaint  Patient presents with  . Medical Management of Chronic Issues    HPI:  Pt is a 82 y.o. male seen today for medical management of chronic diseases. He is a Hospice pt with a diagnosis of Parkinson's Disease. He reports his tremors are improved most of the time, but still difficult to eat. He appears to be more depressed at the mention of having to depend on someone else for everything he does. 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 His CHF and afib are well controlled. No recent exacerbations. No other c/o. VSS   Past Medical History:  Diagnosis Date  . Atrial fibrillation ([place])   . CHF (congestive heart failure) ([place])   . Parkinson's disease ([place])   . Prostate cancer ([place])   . Psoriasis   . Vertigo    No past surgical history on file.  Allergies  Allergen Reactions  . Penicillins       Medication List       Accurate as of 08/28/16 11:59 PM. Always use your most recent med list.          acetaminophen 325 MG tablet Commonly known as:   TYLENOL Take 650 mg by mouth every 4 (four) hours as needed.   acetaminophen 500 MG tablet Commonly known as:  TYLENOL Take 500 mg by mouth 2 (two) times daily as needed.   alfuzosin 10 MG 24 hr tablet Commonly known as:  UROXATRAL Take 10 mg by mouth daily.   alum & mag hydroxide-simeth [id number] MG/5ML suspension Commonly known as:  MAALOX PLUS Take 30 mLs by mouth every 4 (four) hours as needed for indigestion.   baclofen 10 MG tablet Commonly known as:  LIORESAL Take 5 mg by mouth 3 (three) times daily.   bisacodyl 10 MG suppository Commonly known as:  DULCOLAX Place 10 mg rectally daily as needed for moderate constipation.   Buffered Aspirin 325 MG Tabs Take 1 tablet by mouth daily.   buPROPion 150 MG 24 hr tablet Commonly known as:  WELLBUTRIN XL Take 150 mg by mouth daily.   carbidopa-levodopa 50-200 MG tablet Commonly known as:  SINEMET CR Take 1 tablet by mouth at bedtime.   carbidopa-levodopa 25-100 MG tablet Commonly known as:  SINEMET IR Take 1-2 tablets by mouth 2 (two) times daily as needed.   carbidopa-levodopa 25-100 MG tablet Commonly known as:  SINEMET IR Take 1.5 tablets by mouth 4 (four) times daily.   cholecalciferol 1000 units tablet Commonly known as:  VITAMIN D Take 1,000 Units by mouth daily.  citalopram 20 MG tablet Commonly known as:  CELEXA Take 20 mg by mouth at bedtime.   clonazePAM 1 MG tablet Commonly known as:  KLONOPIN Take 1 mg by mouth every evening.   donepezil 5 MG tablet Commonly known as:  ARICEPT Take 5 mg by mouth daily.   fludrocortisone 0.1 MG tablet Commonly known as:  FLORINEF Take 0.1 mg by mouth daily.   folic acid 1 MG tablet Commonly known as:  FOLVITE Take 1 mg by mouth daily.   hydrocortisone cream 1 % Apply 1 application topically 4 (four) times daily as needed for itching. Apply thin film to clean, dry hemorrhoids   LORazepam 0.5 MG tablet Commonly known as:  ATIVAN Take 0.5 mg by mouth  every 4 (four) hours as needed for anxiety.   magnesium hydroxide 400 MG/5ML suspension Commonly known as:  MILK OF MAGNESIA Take 30 mLs by mouth every 4 (four) hours as needed for mild constipation.   memantine 10 MG tablet Commonly known as:  NAMENDA Take 10 mg by mouth 2 (two) times daily.   morphine 20 MG/ML concentrated solution Commonly known as:  ROXANOL Take 5-10 mg by mouth every hour as needed for severe pain.   potassium chloride 10 MEQ CR capsule Commonly known as:  MICRO-K Take 10 mEq by mouth daily.   predniSONE 5 MG tablet Commonly known as:  DELTASONE Take 5 mg by mouth daily.   sorbitol 70 % solution Take 30 mLs by mouth 2 (two) times daily as needed.   traZODone 100 MG tablet Commonly known as:  DESYREL Take 100 mg by mouth at bedtime as needed for sleep.   vitamin B-12 1000 MCG tablet Commonly known as:  CYANOCOBALAMIN Take 1,000 mcg by mouth daily.       Review of Systems  Constitutional: Positive for fatigue.  HENT: Negative.   Respiratory: Negative for cough, chest tightness and shortness of breath.   Cardiovascular: Negative for chest pain.  Gastrointestinal: Negative.   Genitourinary: Negative.   Musculoskeletal: Positive for arthralgias and gait problem.  Skin: Negative.   Neurological: Positive for tremors, speech difficulty and weakness.  Psychiatric/Behavioral: Negative.   All other systems reviewed and are negative.    There is no immunization history on file for this patient. Pertinent  Health Maintenance Due  Topic Date Due  . PNA vac Low Risk Adult (1 of 2 - PCV13) 05/28/1997  . INFLUENZA VACCINE  07/30/2016   No flowsheet data found. Functional Status Survey:    Vitals:   08/26/16 0500  BP: 139/76  Pulse: 83  Resp: 20  Temp: 98 F (36.7 C)  SpO2: 96%  Weight: 145 lb 12.8 oz (66.1 kg)   Body mass index is 22.17 kg/m. Physical Exam  Constitutional: He is oriented to person, place, and time. He appears  well-developed and well-nourished. No distress.  HENT:  Head: Normocephalic and atraumatic.  Eyes: Conjunctivae and EOM are normal. Pupils are equal, round, and reactive to light.  Neck: Normal range of motion. Neck supple. No JVD present.  Cardiovascular: Normal rate, regular rhythm, normal heart sounds and intact distal pulses.  Exam reveals no gallop and no friction rub.   No murmur heard. Pulmonary/Chest: Effort normal and breath sounds normal. No respiratory distress. He has no wheezes. He has no rales. He exhibits no tenderness.  Abdominal: Soft. Bowel sounds are normal. He exhibits no distension. There is no tenderness.  Musculoskeletal: He exhibits no edema or tenderness.  Neurological: He is alert  and oriented to person, place, and time. He exhibits abnormal muscle tone. Coordination (Parkinson's) abnormal.  Skin: Skin is warm. No rash noted. He is not diaphoretic. No erythema. No pallor.  Psychiatric: He has a normal mood and affect. His behavior is normal. Judgment and thought content normal.  Nursing note and vitals reviewed.   Labs reviewed:  Recent Labs  01/24/16 1455  03/21/16 0929 04/05/16 1157 07/28/16 0730  NA 137  < > 139 136 139  K 4.5  < > 3.1* 3.4* 3.4*  CL 98*  < > 103 100* 103  CO2 24  < > 29 31 30   GLUCOSE 82  < > 86 86 89  BUN 32*  < > 13 17 14   CREATININE 1.72*  < > 0.97 0.83 0.96  CALCIUM 9.8  < > 8.9 8.7* 8.9  MG 1.7  --   --   --   --   < > = values in this interval not displayed.  Recent Labs  03/21/16 0929 04/05/16 1157 07/28/16 0730  AST 20 19 14*  ALT 18 7* <5*  ALKPHOS 107 83 81  BILITOT 0.9 0.9 1.1  PROT 6.2* 5.8* 6.0*  ALBUMIN 3.5 3.5 3.7    Recent Labs  03/21/16 0929 04/05/16 1157 07/28/16 0730  WBC 5.4 8.7 7.4  NEUTROABS 3.4 7.8* 5.6  HGB 12.0* 12.6* 12.0*  HCT 35.0* 37.3* 34.5*  MCV 103.2* 101.3* 101.8*  PLT 206 143* 173   Lab Results  Component Value Date   TSH 1.819 03/14/2016   No results found for: HGBA1C Lab  Results  Component Value Date   CHOL 136 11/29/2015   HDL 44 11/29/2015   LDLCALC 80 11/29/2015   TRIG 59 11/29/2015   CHOLHDL 3.1 11/29/2015    Significant Diagnostic Results in last 30 days:  No results found.  Assessment/Plan 1. Parkinson's disease ([place])  Increase Baclofen to 10 mg in the morning and  5 mg po BID- tremors  Monitor weights  Assist with meals/ feedings with each meal  2. Atrial Fibrillation  Continue Aspirin for anticoagulation  Rate well controlled  3. CHF  Well controlled without medications  Continue to monitor for exacerbations  Family/ staff Communication:   Total Time: 40 minutes  Documentation: 10 minutes  Face to Face: 30 minutes  Family/Phone:   Labs/tests ordered:  none   Vikki Ports, NP-C [facility] [address], [place] [address] Cell Phone (Mon-Fri 8am-5pm):  [phone] On Call:  [phone] & follow prompts after 5pm & weekends Office Phone:  [phone] Office Fax:  [phone]

## 2016-09-06 DIAGNOSIS — I4891 Unspecified atrial fibrillation: Secondary | ICD-10-CM | POA: Diagnosis not present

## 2016-09-06 DIAGNOSIS — D649 Anemia, unspecified: Secondary | ICD-10-CM | POA: Diagnosis not present

## 2016-09-06 DIAGNOSIS — E876 Hypokalemia: Secondary | ICD-10-CM | POA: Diagnosis present

## 2016-09-06 DIAGNOSIS — D709 Neutropenia, unspecified: Secondary | ICD-10-CM | POA: Diagnosis present

## 2016-09-06 LAB — URINALYSIS COMPLETE WITH MICROSCOPIC (ARMC ONLY)
BILIRUBIN URINE: NEGATIVE
Bacteria, UA: NONE SEEN
GLUCOSE, UA: NEGATIVE mg/dL
Nitrite: NEGATIVE
PH: 6 (ref 5.0–8.0)
Protein, ur: NEGATIVE mg/dL
SPECIFIC GRAVITY, URINE: 1.015 (ref 1.005–1.030)
SQUAMOUS EPITHELIAL / LPF: NONE SEEN

## 2016-09-09 ENCOUNTER — Non-Acute Institutional Stay (SKILLED_NURSING_FACILITY): Payer: Medicare Other | Admitting: Gerontology

## 2016-09-09 DIAGNOSIS — N390 Urinary tract infection, site not specified: Secondary | ICD-10-CM

## 2016-09-09 DIAGNOSIS — G4752 REM sleep behavior disorder: Secondary | ICD-10-CM | POA: Insufficient documentation

## 2016-09-09 LAB — URINE CULTURE

## 2016-09-09 NOTE — Progress Notes (Signed)
Location:      Place of Service:  SNF (31) Provider:  Toni Arthurs, NP-C  SPARKS,JEFFREY D, MD  Patient Care Team: Idelle Crouch, MD as PCP - General (Internal Medicine)  Extended Emergency Contact Information Primary Emergency Contact: Derl Barrow (Daughter)  [place] Phone: [phone] Relation: None  Code Status:  dnr Goals of care: Advanced Directive information Advanced Directives 07/28/2016  Does patient have an advance directive? Yes  Type of Advance Directive Out of facility DNR (pink MOST or yellow form)  Does patient want to make changes to advanced directive? No - Patient declined  Copy of advanced directive(s) in chart? Yes  Pre-existing out of facility DNR order (yellow form or pink MOST form) Yellow form placed in chart (order not valid for inpatient use)     Chief Complaint  Patient presents with  . Acute Visit    HPI:  Pt is a 82 y.o. male seen today for an acute visit for UTI/ burning with urination as well of complaints of REM sleep D/O exacerbation. Pt reported 1 week ago punching the side rail of the bed as a result of the REM d/o. He says it is disturbing to him and he has become violent with them. He is fearful of hurting someone as a result of it. Pt reports burning pain with urination and decreased frequency with decreased volume. No hematuria, no flank pain. Nursing reports urine looks cloudy. Pt treated 1 month ago for UTI with resolution of symptoms. VSS. No other complaints   Past Medical History:  Diagnosis Date  . Atrial fibrillation ([place])   . CHF (congestive heart failure) ([place])   . Parkinson's disease ([place])   . Prostate cancer ([place])   . Psoriasis   . Vertigo    No past surgical history on file.  Allergies  Allergen Reactions  . Penicillins       Medication List       Accurate as of 09/09/16 12:41 PM. Always use your most recent med list.          acetaminophen 325 MG tablet Commonly known as:   TYLENOL Take 650 mg by mouth every 4 (four) hours as needed.   acetaminophen 500 MG tablet Commonly known as:  TYLENOL Take 500 mg by mouth 2 (two) times daily as needed.   alfuzosin 10 MG 24 hr tablet Commonly known as:  UROXATRAL Take 10 mg by mouth daily.   alum & mag hydroxide-simeth [id number] MG/5ML suspension Commonly known as:  MAALOX PLUS Take 30 mLs by mouth every 4 (four) hours as needed for indigestion.   baclofen 10 MG tablet Commonly known as:  LIORESAL Take 5 mg by mouth 3 (three) times daily.   bisacodyl 10 MG suppository Commonly known as:  DULCOLAX Place 10 mg rectally daily as needed for moderate constipation.   Buffered Aspirin 325 MG Tabs Take 1 tablet by mouth daily.   buPROPion 150 MG 24 hr tablet Commonly known as:  WELLBUTRIN XL Take 150 mg by mouth daily.   carbidopa-levodopa 50-200 MG tablet Commonly known as:  SINEMET CR Take 1 tablet by mouth at bedtime.   carbidopa-levodopa 25-100 MG tablet Commonly known as:  SINEMET IR Take 1-2 tablets by mouth 2 (two) times daily as needed.   carbidopa-levodopa 25-100 MG tablet Commonly known as:  SINEMET IR Take 1.5 tablets by mouth 4 (four) times daily.   cholecalciferol 1000 units tablet Commonly known as:  VITAMIN D Take 1,000 Units by  mouth daily.   citalopram 20 MG tablet Commonly known as:  CELEXA Take 20 mg by mouth at bedtime.   clonazePAM 1 MG tablet Commonly known as:  KLONOPIN Take 1 mg by mouth every evening.   donepezil 5 MG tablet Commonly known as:  ARICEPT Take 5 mg by mouth daily.   fludrocortisone 0.1 MG tablet Commonly known as:  FLORINEF Take 0.1 mg by mouth daily.   folic acid 1 MG tablet Commonly known as:  FOLVITE Take 1 mg by mouth daily.   hydrocortisone cream 1 % Apply 1 application topically 4 (four) times daily as needed for itching. Apply thin film to clean, dry hemorrhoids   LORazepam 0.5 MG tablet Commonly known as:  ATIVAN Take 0.5 mg by mouth  every 4 (four) hours as needed for anxiety.   magnesium hydroxide 400 MG/5ML suspension Commonly known as:  MILK OF MAGNESIA Take 30 mLs by mouth every 4 (four) hours as needed for mild constipation.   memantine 10 MG tablet Commonly known as:  NAMENDA Take 10 mg by mouth 2 (two) times daily.   morphine 20 MG/ML concentrated solution Commonly known as:  ROXANOL Take 5-10 mg by mouth every hour as needed for severe pain.   potassium chloride 10 MEQ CR capsule Commonly known as:  MICRO-K Take 10 mEq by mouth daily.   predniSONE 5 MG tablet Commonly known as:  DELTASONE Take 5 mg by mouth daily.   sorbitol 70 % solution Take 30 mLs by mouth 2 (two) times daily as needed.   traZODone 100 MG tablet Commonly known as:  DESYREL Take 100 mg by mouth at bedtime as needed for sleep.   vitamin B-12 1000 MCG tablet Commonly known as:  CYANOCOBALAMIN Take 1,000 mcg by mouth daily.       Review of Systems  Constitutional: Positive for fatigue.  HENT: Negative.   Respiratory: Negative for cough, chest tightness and shortness of breath.   Cardiovascular: Negative for chest pain.  Gastrointestinal: Negative.   Genitourinary: Negative.   Musculoskeletal: Positive for arthralgias and gait problem.  Skin: Negative.   Neurological: Positive for tremors, speech difficulty and weakness.  Psychiatric/Behavioral: Negative.   All other systems reviewed and are negative.    There is no immunization history on file for this patient. Pertinent  Health Maintenance Due  Topic Date Due  . PNA vac Low Risk Adult (1 of 2 - PCV13) 05/28/1997  . INFLUENZA VACCINE  07/30/2016   No flowsheet data found. Functional Status Survey:    Vitals:   09/09/16 1235  BP: 110/70  Pulse: 60  Resp: 18  Temp: 97.7 F (36.5 C)  SpO2: 95%   There is no height or weight on file to calculate BMI. Physical Exam  Constitutional: He is oriented to person, place, and time. He appears well-developed and  well-nourished. No distress.  HENT:  Head: Normocephalic and atraumatic.  Eyes: Conjunctivae and EOM are normal. Pupils are equal, round, and reactive to light.  Neck: Normal range of motion. Neck supple. No JVD present.  Cardiovascular: Normal rate, regular rhythm, normal heart sounds and intact distal pulses.  Exam reveals no gallop and no friction rub.   No murmur heard. Pulmonary/Chest: Effort normal and breath sounds normal. No respiratory distress. He has no wheezes. He has no rales. He exhibits no tenderness.  Abdominal: Soft. Bowel sounds are normal. He exhibits no distension. There is no tenderness.  Musculoskeletal: He exhibits no edema or tenderness.  Neurological: He is  alert and oriented to person, place, and time. He exhibits abnormal muscle tone. Coordination (Parkinson's) abnormal.  Skin: Skin is warm. No rash noted. He is not diaphoretic. No erythema. No pallor.  Psychiatric: He has a normal mood and affect. His behavior is normal. Judgment and thought content normal.  Nursing note and vitals reviewed.   Labs reviewed:  Recent Labs  01/24/16 1455  03/21/16 0929 04/05/16 1157 07/28/16 0730  NA 137  < > 139 136 139  K 4.5  < > 3.1* 3.4* 3.4*  CL 98*  < > 103 100* 103  CO2 24  < > 29 31 30   GLUCOSE 82  < > 86 86 89  BUN 32*  < > 13 17 14   CREATININE 1.72*  < > 0.97 0.83 0.96  CALCIUM 9.8  < > 8.9 8.7* 8.9  MG 1.7  --   --   --   --   < > = values in this interval not displayed.  Recent Labs  03/21/16 0929 04/05/16 1157 07/28/16 0730  AST 20 19 14*  ALT 18 7* <5*  ALKPHOS 107 83 81  BILITOT 0.9 0.9 1.1  PROT 6.2* 5.8* 6.0*  ALBUMIN 3.5 3.5 3.7    Recent Labs  03/21/16 0929 04/05/16 1157 07/28/16 0730  WBC 5.4 8.7 7.4  NEUTROABS 3.4 7.8* 5.6  HGB 12.0* 12.6* 12.0*  HCT 35.0* 37.3* 34.5*  MCV 103.2* 101.3* 101.8*  PLT 206 143* 173   Lab Results  Component Value Date   TSH 1.819 03/14/2016   No results found for: HGBA1C Lab Results    Component Value Date   CHOL 136 11/29/2015   HDL 44 11/29/2015   LDLCALC 80 11/29/2015   TRIG 59 11/29/2015   CHOLHDL 3.1 11/29/2015    Significant Diagnostic Results in last 30 days:  No results found.  Assessment/Plan 1. Urinary tract infection without hematuria, site unspecified  Monurol 3 grams- 1 packet every three days x 7 doses for UTI  Pyridium 200 mg po Q 12 hours x 2 days for urinary analgesic  2. REM sleep behavior disorder  Already on clonazepam 1 mg at HS  Try Melatonin 3 mg po Q HS  Family/ staff Communication:   Total Time:  Documentation:  Face to Face:  Family/Phone:   Labs/tests ordered:  none  Medication list reviewed and assessed for continued appropriateness.  Vikki Ports, NP-C Geriatrics [facility] [phone], [place] [address] Cell Phone (Mon-Fri 8am-5pm):  [phone] On Call:  [phone] & follow prompts after 5pm & weekends Office Phone:  [phone] Office Fax:  [phone]

## 2016-09-23 ENCOUNTER — Non-Acute Institutional Stay (SKILLED_NURSING_FACILITY): Payer: Medicare Other | Admitting: Gerontology

## 2016-09-23 DIAGNOSIS — I950 Idiopathic hypotension: Secondary | ICD-10-CM

## 2016-09-23 DIAGNOSIS — H65192 Other acute nonsuppurative otitis media, left ear: Secondary | ICD-10-CM | POA: Diagnosis not present

## 2016-09-23 NOTE — Progress Notes (Signed)
Location:      Place of Service:  SNF (31) Provider:  Toni Arthurs, NP-C  [name] D, MD  Patient Care Team: Idelle Crouch, MD as PCP - General (Internal Medicine)  Extended Emergency Contact Information Primary Emergency Contact: Derl Barrow (Daughter)  [place] Phone: [phone] Relation: None  Code Status:  dnr Goals of care: Advanced Directive information Advanced Directives 07/28/2016  Does patient have an advance directive? Yes  Type of Advance Directive Out of facility DNR (pink MOST or yellow form)  Does patient want to make changes to advanced directive? No - Patient declined  Copy of advanced directive(s) in chart? Yes  Pre-existing out of facility DNR order (yellow form or pink MOST form) Yellow form placed in chart (order not valid for inpatient use)     Chief Complaint  Patient presents with  . Acute Visit    HPI:  Pt is a 82 y.o. male seen today for an acute visit for Complaint of left ear pain. Patient describes pain as sharp and stabbing at times. He reports this has been ongoing for approximately 1 week. Patient reports his ears felt full, and when he opens his mouth, this causes the ears to "pop." This is when he feels the sharp pains. Patient denies seasonal allergies, rhinitis. Patient denies itching of the ears, or external pain. Nursing reports recent hypotension of unknown origin. Patient denies syncope or dizziness. Aside from slight hypotension, vital signs are stable. No other complaints.   Past Medical History:  Diagnosis Date  . Atrial fibrillation ([place])   . CHF (congestive heart failure) ([place])   . Parkinson's disease ([place])   . Prostate cancer ([place])   . Psoriasis   . Vertigo    No past surgical history on file.  Allergies  Allergen Reactions  . Penicillins       Medication List       Accurate as of 09/23/16  6:31 PM. Always use your most recent med list.          acetaminophen 325 MG  tablet Commonly known as:  TYLENOL Take 650 mg by mouth every 4 (four) hours as needed.   acetaminophen 500 MG tablet Commonly known as:  TYLENOL Take 500 mg by mouth 2 (two) times daily as needed.   alfuzosin 10 MG 24 hr tablet Commonly known as:  UROXATRAL Take 10 mg by mouth daily.   alum & mag hydroxide-simeth [id number] MG/5ML suspension Commonly known as:  MAALOX PLUS Take 30 mLs by mouth every 4 (four) hours as needed for indigestion.   baclofen 10 MG tablet Commonly known as:  LIORESAL Take 5 mg by mouth 3 (three) times daily.   bisacodyl 10 MG suppository Commonly known as:  DULCOLAX Place 10 mg rectally daily as needed for moderate constipation.   Buffered Aspirin 325 MG Tabs Take 1 tablet by mouth daily.   buPROPion 150 MG 24 hr tablet Commonly known as:  WELLBUTRIN XL Take 150 mg by mouth daily.   carbidopa-levodopa 50-200 MG tablet Commonly known as:  SINEMET CR Take 1 tablet by mouth at bedtime.   carbidopa-levodopa 25-100 MG tablet Commonly known as:  SINEMET IR Take 1-2 tablets by mouth 2 (two) times daily as needed.   carbidopa-levodopa 25-100 MG tablet Commonly known as:  SINEMET IR Take 1.5 tablets by mouth 4 (four) times daily.   cholecalciferol 1000 units tablet Commonly known as:  VITAMIN [name] Take 1,000 Units by mouth daily.   citalopram 20  MG tablet Commonly known as:  CELEXA Take 20 mg by mouth at bedtime.   clonazePAM 1 MG tablet Commonly known as:  KLONOPIN Take 1 mg by mouth every evening.   donepezil 5 MG tablet Commonly known as:  ARICEPT Take 5 mg by mouth daily.   fludrocortisone 0.1 MG tablet Commonly known as:  FLORINEF Take 0.1 mg by mouth daily.   folic acid 1 MG tablet Commonly known as:  FOLVITE Take 1 mg by mouth daily.   hydrocortisone cream 1 % Apply 1 application topically 4 (four) times daily as needed for itching. Apply thin film to clean, dry hemorrhoids   LORazepam 0.5 MG tablet Commonly known as:   ATIVAN Take 0.5 mg by mouth every 4 (four) hours as needed for anxiety.   magnesium hydroxide 400 MG/5ML suspension Commonly known as:  MILK OF MAGNESIA Take 30 mLs by mouth every 4 (four) hours as needed for mild constipation.   memantine 10 MG tablet Commonly known as:  NAMENDA Take 10 mg by mouth 2 (two) times daily.   morphine 20 MG/ML concentrated solution Commonly known as:  ROXANOL Take 5-10 mg by mouth every hour as needed for severe pain.   potassium chloride 10 MEQ CR capsule Commonly known as:  MICRO-K Take 10 mEq by mouth daily.   predniSONE 5 MG tablet Commonly known as:  DELTASONE Take 5 mg by mouth daily.   sorbitol 70 % solution Take 30 mLs by mouth 2 (two) times daily as needed.   traZODone 100 MG tablet Commonly known as:  DESYREL Take 100 mg by mouth at bedtime as needed for sleep.   vitamin B-12 1000 MCG tablet Commonly known as:  CYANOCOBALAMIN Take 1,000 mcg by mouth daily.       Review of Systems  Constitutional: Negative for activity change, appetite change, chills, diaphoresis, fatigue and fever.  HENT: Positive for ear pain. Negative for congestion, ear discharge, facial swelling, hearing loss, postnasal drip, rhinorrhea, sinus pressure, sore throat, tinnitus and trouble swallowing.   Respiratory: Negative for chest tightness and shortness of breath.   Cardiovascular: Negative for chest pain and palpitations.  Neurological: Positive for tremors. Negative for dizziness, seizures, syncope, facial asymmetry, speech difficulty, light-headedness, numbness and headaches.  All other systems reviewed and are negative.    There is no immunization history on file for this patient. Pertinent  Health Maintenance Due  Topic Date Due  . PNA vac Low Risk Adult (1 of 2 - PCV13) 05/28/1997  . INFLUENZA VACCINE  07/30/2016   No flowsheet data found. Functional Status Survey:    Vitals:   09/23/16 0700  BP: (!) 90/51  Pulse: 77  Resp: 18  Temp:  97.6 F (36.4 C)  SpO2: 100%   There is no height or weight on file to calculate BMI. Physical Exam  Constitutional: He is oriented to person, place, and time. Vital signs are normal. He appears well-developed and well-nourished. He is active and cooperative. He does not appear ill. No distress.  HENT:  Head: Normocephalic and atraumatic.  Right Ear: Tympanic membrane, external ear and ear canal normal. Decreased hearing is noted.  Left Ear: External ear and ear canal normal. No swelling or tenderness. Tympanic membrane is not perforated, not erythematous, not retracted and not bulging. A middle ear effusion is present. Decreased hearing is noted.  Mouth/Throat: Uvula is midline, oropharynx is clear and moist and mucous membranes are normal. Mucous membranes are not pale, not dry and not cyanotic.  Eyes: Conjunctivae, EOM and lids are normal. Pupils are equal, round, and reactive to light.  Neck: Trachea normal, normal range of motion and full passive range of motion without pain. Neck supple. No JVD present. No tracheal deviation, no edema and no erythema present. No thyromegaly present.  Cardiovascular: Normal rate, regular rhythm, normal heart sounds, intact distal pulses and normal pulses.  Exam reveals no gallop and no distant heart sounds.   Pulmonary/Chest: Effort normal and breath sounds normal. No accessory muscle usage. No respiratory distress. He has no wheezes. He has no rhonchi. He has no rales. He exhibits no tenderness.  Abdominal: Normal appearance and bowel sounds are normal. He exhibits no distension and no ascites. There is no tenderness.  Musculoskeletal: Normal range of motion. He exhibits no edema or tenderness.  Expected osteoarthritis, stiffness  Neurological: He is alert and oriented to person, place, and time. He displays tremor. He exhibits abnormal muscle tone. Coordination and gait abnormal.  Skin: Skin is warm, dry and intact. He is not diaphoretic. No cyanosis. No  pallor. Nails show no clubbing.  Psychiatric: He has a normal mood and affect. His speech is normal and behavior is normal. Judgment and thought content normal. Cognition and memory are normal.  Nursing note and vitals reviewed.   Labs reviewed:  Recent Labs  01/24/16 1455  03/21/16 0929 04/05/16 1157 07/28/16 0730  NA 137  < > 139 136 139  K 4.5  < > 3.1* 3.4* 3.4*  CL 98*  < > 103 100* 103  CO2 24  < > 29 31 30   GLUCOSE 82  < > 86 86 89  BUN 32*  < > 13 17 14   CREATININE 1.72*  < > 0.97 0.83 0.96  CALCIUM 9.8  < > 8.9 8.7* 8.9  MG 1.7  --   --   --   --   < > = values in this interval not displayed.  Recent Labs  03/21/16 0929 04/05/16 1157 07/28/16 0730  AST 20 19 14*  ALT 18 7* <5*  ALKPHOS 107 83 81  BILITOT 0.9 0.9 1.1  PROT 6.2* 5.8* 6.0*  ALBUMIN 3.5 3.5 3.7    Recent Labs  03/21/16 0929 04/05/16 1157 07/28/16 0730  WBC 5.4 8.7 7.4  NEUTROABS 3.4 7.8* 5.6  HGB 12.0* 12.6* 12.0*  HCT 35.0* 37.3* 34.5*  MCV 103.2* 101.3* 101.8*  PLT 206 143* 173   Lab Results  Component Value Date   TSH 1.819 03/14/2016   No results found for: HGBA1C Lab Results  Component Value Date   CHOL 136 11/29/2015   HDL 44 11/29/2015   LDLCALC 80 11/29/2015   TRIG 59 11/29/2015   CHOLHDL 3.1 11/29/2015    Significant Diagnostic Results in last 30 days:  No results found.  Assessment/Plan 1. Acute otitis media with effusion, left  Zyrtec 5 mg by mouth daily at bedtime for acute otitis media with effusion  2. Idiopathic hypotension   encourage by mouth fluid intake  Change alfuzosin to at bedtime  Family/ staff Communication:   Total Time:  Documentation:  Face to Face:  Family/Phone:   Labs/tests ordered:  none  Medication list reviewed and assessed for continued appropriateness.  Vikki Ports, NP-C Geriatrics [facility] [phone], [place] [address] Cell Phone (Mon-Fri 8am-5pm):   [phone] On Call:  [phone] & follow prompts after 5pm & weekends Office Phone:  [phone] Office Fax:  [phone]

## 2016-09-26 DIAGNOSIS — D649 Anemia, unspecified: Secondary | ICD-10-CM | POA: Diagnosis not present

## 2016-09-26 LAB — COMPREHENSIVE METABOLIC PANEL
ALK PHOS: 63 U/L (ref 38–126)
ALT: <5 U/L — ABNORMAL LOW (ref 17–63)
ANION GAP: 6 (ref 5–15)
AST: 23 U/L (ref 15–41)
Albumin: 3.4 g/dL — ABNORMAL LOW (ref 3.5–5.0)
BILIRUBIN TOTAL: 0.7 mg/dL (ref 0.3–1.2)
BUN: 27 mg/dL — AB (ref 6–20)
CALCIUM: 8.7 mg/dL — AB (ref 8.9–10.3)
CO2: 25 mmol/L (ref 22–32)
Chloride: 105 mmol/L (ref 101–111)
Creatinine, Ser: 1.14 mg/dL (ref 0.61–1.24)
GFR calc Af Amer: >60 mL/min (ref >60)
GFR, EST NON AFRICAN AMERICAN: 57 mL/min — AB (ref >60)
GLUCOSE: 142 mg/dL — AB (ref 65–99)
POTASSIUM: 4.7 mmol/L (ref 3.5–5.1)
Sodium: 136 mmol/L (ref 135–145)
TOTAL PROTEIN: 5.6 g/dL — AB (ref 6.5–8.1)

## 2016-09-26 LAB — CBC WITH DIFFERENTIAL/PLATELET
BASOS ABS: 0 10*3/uL (ref 0–0.1)
BASOS PCT: 0 %
EOS PCT: 1 %
Eosinophils Absolute: 0.1 10*3/uL (ref 0–0.7)
HCT: 28.4 % — ABNORMAL LOW (ref 40.0–52.0)
Hemoglobin: 9.8 g/dL — ABNORMAL LOW (ref 13.0–18.0)
LYMPHS PCT: 8 %
Lymphs Abs: 0.5 10*3/uL — ABNORMAL LOW (ref 1.0–3.6)
MCH: 34.7 pg — ABNORMAL HIGH (ref 26.0–34.0)
MCHC: 34.5 g/dL (ref 32.0–36.0)
MCV: 100.4 fL — AB (ref 80.0–100.0)
MONO ABS: 0.4 10*3/uL (ref 0.2–1.0)
Monocytes Relative: 7 %
NEUTROS ABS: 4.9 10*3/uL (ref 1.4–6.5)
Neutrophils Relative %: 84 %
PLATELETS: 186 10*3/uL (ref 150–440)
RBC: 2.83 MIL/uL — AB (ref 4.40–5.90)
RDW: 13.2 % (ref 11.5–14.5)
WBC: 5.9 10*3/uL (ref 3.8–10.6)

## 2016-09-29 ENCOUNTER — Encounter
Admission: RE | Admit: 2016-09-29 | Discharge: 2016-09-29 | Disposition: A | Discharge status: Home / Self Care | Source: Ambulatory Visit | Attending: Internal Medicine | Admitting: Internal Medicine

## 2016-09-29 DIAGNOSIS — R300 Dysuria: Secondary | ICD-10-CM | POA: Insufficient documentation

## 2016-10-08 DIAGNOSIS — R300 Dysuria: Secondary | ICD-10-CM | POA: Diagnosis present

## 2016-10-08 LAB — URINALYSIS COMPLETE WITH MICROSCOPIC (ARMC ONLY)
BACTERIA UA: NONE SEEN
Bilirubin Urine: NEGATIVE
GLUCOSE, UA: NEGATIVE mg/dL
Nitrite: NEGATIVE
PROTEIN: NEGATIVE mg/dL
SPECIFIC GRAVITY, URINE: 1.016 (ref 1.005–1.030)
SQUAMOUS EPITHELIAL / LPF: NONE SEEN
pH: 6 (ref 5.0–8.0)

## 2016-10-11 LAB — URINE CULTURE

## 2016-10-30 ENCOUNTER — Encounter
Admission: RE | Admit: 2016-10-30 | Discharge: 2016-10-30 | Disposition: A | Discharge status: Home / Self Care | Source: Ambulatory Visit | Attending: Internal Medicine | Admitting: Internal Medicine

## 2016-10-30 DIAGNOSIS — R410 Disorientation, unspecified: Secondary | ICD-10-CM | POA: Insufficient documentation

## 2016-11-01 DIAGNOSIS — R410 Disorientation, unspecified: Secondary | ICD-10-CM | POA: Diagnosis present

## 2016-11-01 LAB — URINALYSIS COMPLETE WITH MICROSCOPIC (ARMC ONLY)
Bacteria, UA: NONE SEEN
Bilirubin Urine: NEGATIVE
GLUCOSE, UA: NEGATIVE mg/dL
Ketones, ur: NEGATIVE mg/dL
LEUKOCYTES UA: NEGATIVE
NITRITE: NEGATIVE
PH: 6 (ref 5.0–8.0)
PROTEIN: NEGATIVE mg/dL
SPECIFIC GRAVITY, URINE: 1.012 (ref 1.005–1.030)
Squamous Epithelial / LPF: NONE SEEN

## 2016-11-02 LAB — URINE CULTURE: Culture: <10000 — AB

## 2016-11-12 DIAGNOSIS — R410 Disorientation, unspecified: Secondary | ICD-10-CM | POA: Diagnosis not present

## 2016-11-12 LAB — URINALYSIS COMPLETE WITH MICROSCOPIC (ARMC ONLY)
Bacteria, UA: NONE SEEN
Bilirubin Urine: NEGATIVE
GLUCOSE, UA: NEGATIVE mg/dL
Hgb urine dipstick: NEGATIVE
Leukocytes, UA: NEGATIVE
NITRITE: NEGATIVE
Protein, ur: NEGATIVE mg/dL
SPECIFIC GRAVITY, URINE: 1.012 (ref 1.005–1.030)
Squamous Epithelial / LPF: NONE SEEN
pH: 6 (ref 5.0–8.0)

## 2016-11-13 LAB — URINE CULTURE: Culture: NO GROWTH

## 2016-11-29 ENCOUNTER — Encounter
Admission: RE | Admit: 2016-11-29 | Discharge: 2016-11-29 | Disposition: A | Discharge status: Home / Self Care | Payer: Medicare Other | Source: Ambulatory Visit | Attending: Internal Medicine | Admitting: Internal Medicine

## 2016-12-04 ENCOUNTER — Non-Acute Institutional Stay (SKILLED_NURSING_FACILITY): Payer: Medicare Other | Admitting: Gerontology

## 2016-12-04 DIAGNOSIS — I4891 Unspecified atrial fibrillation: Secondary | ICD-10-CM | POA: Diagnosis not present

## 2016-12-04 DIAGNOSIS — G20 Parkinson's disease: Secondary | ICD-10-CM | POA: Diagnosis not present

## 2016-12-04 DIAGNOSIS — L409 Psoriasis, unspecified: Secondary | ICD-10-CM | POA: Diagnosis not present

## 2016-12-04 DIAGNOSIS — I5020 Unspecified systolic (congestive) heart failure: Secondary | ICD-10-CM

## 2016-12-16 DIAGNOSIS — L409 Psoriasis, unspecified: Secondary | ICD-10-CM | POA: Insufficient documentation

## 2016-12-16 NOTE — Progress Notes (Signed)
Location:      Place of Service:  SNF (31) Provider:  Toni Arthurs, NP-C  SPARKS,JEFFREY D, MD  Patient Care Team: Idelle Crouch, MD as PCP - General (Internal Medicine)  Extended Emergency Contact Information Primary Emergency Contact: Derl Barrow (Daughter)  [place] Phone: [phone] Relation: None  Code Status:  DNR Goals of care: Advanced Directive information Advanced Directives 07/28/2016  Does Patient Have a Medical Advance Directive? Yes  Type of Advance Directive Out of facility DNR (pink MOST or yellow form)  Does patient want to make changes to medical advance directive? No - Patient declined  Copy of [place] in Chart? Yes  Pre-existing out of facility DNR order (yellow form or pink MOST form) Yellow form placed in chart (order not valid for inpatient use)     Chief Complaint  Patient presents with  . Medical Management of Chronic Issues    HPI:  Pt is a 82 y.o. male seen today for medical management of chronic diseases. He is a Hospice pt with a diagnosis of Parkinson's Disease. He reports his tremors are Unchanged, but still difficult to eat.  His CHF and afib are well controlled. No recent exacerbations. Patient has chronic psoriasis. He uses Gold Bond lotion for eczema and psoriasis as needed to be areas. This is per his home regimen. He denies any pain or itching or discomfort at the psoriasis sites at this time. No change to regimen as it appears to be working for him. No other c/o. VSS   Past Medical History:  Diagnosis Date  . Atrial fibrillation ([place])   . CHF (congestive heart failure) ([place])   . Parkinson's disease ([place])   . Prostate cancer ([place])   . Psoriasis   . Vertigo    No past surgical history on file.  Allergies  Allergen Reactions  . Penicillins     Allergies as of 12/04/2016      Reactions   Penicillins       Medication List       Accurate as of 12/04/16 11:59 PM. Always use  your most recent med list.          acetaminophen 325 MG tablet Commonly known as:  TYLENOL Take 650 mg by mouth every 4 (four) hours as needed.   acetaminophen 500 MG tablet Commonly known as:  TYLENOL Take 500 mg by mouth 2 (two) times daily as needed.   alfuzosin 10 MG 24 hr tablet Commonly known as:  UROXATRAL Take 10 mg by mouth daily.   alum & mag hydroxide-simeth [id number] MG/5ML suspension Commonly known as:  MAALOX PLUS Take 30 mLs by mouth every 4 (four) hours as needed for indigestion.   baclofen 10 MG tablet Commonly known as:  LIORESAL Take 5 mg by mouth 3 (three) times daily.   bisacodyl 10 MG suppository Commonly known as:  DULCOLAX Place 10 mg rectally daily as needed for moderate constipation.   Buffered Aspirin 325 MG Tabs Take 1 tablet by mouth daily.   buPROPion 150 MG 24 hr tablet Commonly known as:  WELLBUTRIN XL Take 150 mg by mouth daily.   carbidopa-levodopa 50-200 MG tablet Commonly known as:  SINEMET CR Take 1 tablet by mouth at bedtime.   carbidopa-levodopa 25-100 MG tablet Commonly known as:  SINEMET IR Take 1-2 tablets by mouth 2 (two) times daily as needed.   carbidopa-levodopa 25-100 MG tablet Commonly known as:  SINEMET IR Take 1.5 tablets by mouth 4 (  four) times daily.   cholecalciferol 1000 units tablet Commonly known as:  VITAMIN D Take 1,000 Units by mouth daily.   citalopram 20 MG tablet Commonly known as:  CELEXA Take 20 mg by mouth at bedtime.   clonazePAM 1 MG tablet Commonly known as:  KLONOPIN Take 1 mg by mouth every evening.   donepezil 5 MG tablet Commonly known as:  ARICEPT Take 5 mg by mouth daily.   fludrocortisone 0.1 MG tablet Commonly known as:  FLORINEF Take 0.1 mg by mouth daily.   folic acid 1 MG tablet Commonly known as:  FOLVITE Take 1 mg by mouth daily.   hydrocortisone cream 1 % Apply 1 application topically 4 (four) times daily as needed for itching. Apply thin film to clean, dry  hemorrhoids   LORazepam 0.5 MG tablet Commonly known as:  ATIVAN Take 0.5 mg by mouth every 4 (four) hours as needed for anxiety.   magnesium hydroxide 400 MG/5ML suspension Commonly known as:  MILK OF MAGNESIA Take 30 mLs by mouth every 4 (four) hours as needed for mild constipation.   memantine 10 MG tablet Commonly known as:  NAMENDA Take 10 mg by mouth 2 (two) times daily.   morphine 20 MG/ML concentrated solution Commonly known as:  ROXANOL Take 5-10 mg by mouth every hour as needed for severe pain.   potassium chloride 10 MEQ CR capsule Commonly known as:  MICRO-K Take 10 mEq by mouth daily.   predniSONE 5 MG tablet Commonly known as:  DELTASONE Take 5 mg by mouth daily.   sorbitol 70 % solution Take 30 mLs by mouth 2 (two) times daily as needed.   traZODone 100 MG tablet Commonly known as:  DESYREL Take 100 mg by mouth at bedtime as needed for sleep.   vitamin B-12 1000 MCG tablet Commonly known as:  CYANOCOBALAMIN Take 1,000 mcg by mouth daily.       Review of Systems  Constitutional: Positive for fatigue.  HENT: Negative.   Respiratory: Negative for cough, chest tightness and shortness of breath.   Cardiovascular: Negative for chest pain.  Gastrointestinal: Negative.   Genitourinary: Negative.   Musculoskeletal: Positive for arthralgias and gait problem.  Skin: Positive for rash.  Neurological: Positive for tremors, speech difficulty and weakness.  Psychiatric/Behavioral: Negative.   All other systems reviewed and are negative.    There is no immunization history on file for this patient. Pertinent  Health Maintenance Due  Topic Date Due  . PNA vac Low Risk Adult (1 of 2 - PCV13) 05/28/1997  . INFLUENZA VACCINE  07/30/2016   No flowsheet data found. Functional Status Survey:    Vitals:   12/12/16 1150  BP: 106/72  Pulse: 62  Resp: 18  Temp: 97.6 F (36.4 C)  SpO2: 97%  Weight: 141 lb 4.8 oz (64.1 kg)   Body mass index is 21.48  kg/m. Physical Exam  Constitutional: He is oriented to person, place, and time. He appears well-developed and well-nourished. No distress.  HENT:  Head: Normocephalic and atraumatic.  Eyes: Conjunctivae and EOM are normal. Pupils are equal, round, and reactive to light.  Neck: Normal range of motion. Neck supple. No JVD present.  Cardiovascular: Normal rate, regular rhythm, normal heart sounds and intact distal pulses.  Exam reveals no gallop and no friction rub.   No murmur heard. Pulmonary/Chest: Effort normal and breath sounds normal. No respiratory distress. He has no wheezes. He has no rales. He exhibits no tenderness.  Abdominal: Soft. Bowel  sounds are normal. He exhibits no distension. There is no tenderness.  Musculoskeletal: He exhibits no edema or tenderness.  Neurological: He is alert and oriented to person, place, and time. He exhibits abnormal muscle tone. Coordination (Parkinson's) abnormal.  Skin: Skin is warm. No rash noted. He is not diaphoretic. No erythema. No pallor.  Patient has psoriasis on multiple locations of the body. Large red patches on the feet and lower legs. No change from previous assessment.  Psychiatric: He has a normal mood and affect. His behavior is normal. Judgment and thought content normal.  Nursing note and vitals reviewed.   Labs reviewed:  Recent Labs  01/24/16 1455  04/05/16 1157 07/28/16 0730 09/26/16 1435  NA 137  < > 136 139 136  K 4.5  < > 3.4* 3.4* 4.7  CL 98*  < > 100* 103 105  CO2 24  < > 31 30 25   GLUCOSE 82  < > 86 89 142*  BUN 32*  < > 17 14 27*  CREATININE 1.72*  < > 0.83 0.96 1.14  CALCIUM 9.8  < > 8.7* 8.9 8.7*  MG 1.7  --   --   --   --   < > = values in this interval not displayed.  Recent Labs  04/05/16 1157 07/28/16 0730 09/26/16 1435  AST 19 14* 23  ALT 7* <5* <5*  ALKPHOS 83 81 63  BILITOT 0.9 1.1 0.7  PROT 5.8* 6.0* 5.6*  ALBUMIN 3.5 3.7 3.4*    Recent Labs  04/05/16 1157 07/28/16 0730  09/26/16 1435  WBC 8.7 7.4 5.9  NEUTROABS 7.8* 5.6 4.9  HGB 12.6* 12.0* 9.8*  HCT 37.3* 34.5* 28.4*  MCV 101.3* 101.8* 100.4*  PLT 143* 173 186   Lab Results  Component Value Date   TSH 1.819 03/14/2016   No results found for: HGBA1C Lab Results  Component Value Date   CHOL 136 11/29/2015   HDL 44 11/29/2015   LDLCALC 80 11/29/2015   TRIG 59 11/29/2015   CHOLHDL 3.1 11/29/2015    Significant Diagnostic Results in last 30 days:  No results found.  Assessment/Plan 1. Parkinson's disease ([place])  DC Baclofen   Monitor weights  Assist with meals/ feedings with each meal  Continue carbidopa levodopa extended release 50-200 milligrams one tablet by mouth daily at bedtime  Continue carbidopa levodopa 25-100 milligrams 1.5 tablet by mouth 4 times a day for tremors related to Parkinson's disease  2. Atrial Fibrillation  Continue Aspirin for anticoagulation  Rate well controlled  3. CHF  Well controlled without medications  Continue to monitor for exacerbations  4. Psoriasis  Patient is using Gold Bond lotion for psoriasis/eczema  Continue using lotion when necessary per home regimen  May keep at bedside and patient, family or sitter may apply  If areas become puretic and inflamed, will order steroid cream  Family/ staff Communication:   Total Time: 40 minutes  Documentation: 10 minutes  Face to Face: 30 minutes  Family/Phone:   Labs/tests ordered:  none   Vikki Ports, NP-C [facility] [address], [place] [address] Cell Phone (Mon-Fri 8am-5pm):  [phone] On Call:  [phone] & follow prompts after 5pm & weekends Office Phone:  [phone] Office Fax:  [phone]

## 2016-12-30 ENCOUNTER — Encounter
Admission: RE | Admit: 2016-12-30 | Discharge: 2016-12-30 | Disposition: A | Discharge status: Home / Self Care | Source: Ambulatory Visit | Attending: Internal Medicine | Admitting: Internal Medicine

## 2016-12-30 DIAGNOSIS — R05 Cough: Secondary | ICD-10-CM | POA: Insufficient documentation

## 2016-12-30 DIAGNOSIS — R509 Fever, unspecified: Secondary | ICD-10-CM | POA: Insufficient documentation

## 2017-01-06 ENCOUNTER — Non-Acute Institutional Stay (SKILLED_NURSING_FACILITY): Payer: Medicare Other | Admitting: Gerontology

## 2017-01-06 DIAGNOSIS — K2970 Gastritis, unspecified, without bleeding: Secondary | ICD-10-CM

## 2017-01-06 NOTE — Progress Notes (Signed)
Location:      Place of Service:  SNF (31) Provider:  Toni Arthurs, NP-C  SPARKS,JEFFREY D, MD  Patient Care Team: Idelle Crouch, MD as PCP - General (Internal Medicine)  Extended Emergency Contact Information Primary Emergency Contact: Derl Barrow (Daughter)  [place] Phone: [phone] Relation: None  Code Status:  dnr Goals of care: Advanced Directive information Advanced Directives 07/28/2016  Does Patient Have a Medical Advance Directive? Yes  Type of Advance Directive Out of facility DNR (pink MOST or yellow form)  Does patient want to make changes to medical advance directive? No - Patient declined  Copy of [place] in Chart? Yes  Pre-existing out of facility DNR order (yellow form or pink MOST form) Yellow form placed in chart (order not valid for inpatient use)     Chief Complaint  Patient presents with  . Acute Visit    HPI:  Pt is a 83 y.o. male seen today for an acute visit for viral gastritis. Sitter wanted me to see pt d/t "pt is just not himself." Pt was sitting in his chair with his eyes closed and brows furrowed. His tremors seemed worse than usual today. He reports he is not feeling well but unable to "pinpoint" what is wrong. Sitter reports he has had 4 very large stools today. He reports nausea but no vomiting. His color is pale. His abdomen is rigid, but non-tender. Likely related to the excessive tremors, but will get xrays to make sure there is no acute process happening. Pt also has some faint crackles in the LLL that he reports is chronic. However, pt also reports he feels he is having difficulty breathing. Nursing reports sats as normal. Pt got a visitor as I was leaving and he perked up. VSS. No other complaints.    Past Medical History:  Diagnosis Date  . Atrial fibrillation ([place])   . CHF (congestive heart failure) ([place])   . Parkinson's disease ([place])   . Prostate cancer ([place])   . Psoriasis   .  Vertigo    No past surgical history on file.  Allergies  Allergen Reactions  . Penicillins     Allergies as of 01/06/2017      Reactions   Penicillins       Medication List       Accurate as of 01/06/17  9:42 PM. Always use your most recent med list.          acetaminophen 325 MG tablet Commonly known as:  TYLENOL Take 650 mg by mouth every 4 (four) hours as needed.   acetaminophen 500 MG tablet Commonly known as:  TYLENOL Take 500 mg by mouth 2 (two) times daily as needed.   alfuzosin 10 MG 24 hr tablet Commonly known as:  UROXATRAL Take 10 mg by mouth daily.   alum & mag hydroxide-simeth [id number] MG/5ML suspension Commonly known as:  MAALOX PLUS Take 30 mLs by mouth every 4 (four) hours as needed for indigestion.   baclofen 10 MG tablet Commonly known as:  LIORESAL Take 5 mg by mouth 3 (three) times daily.   bisacodyl 10 MG suppository Commonly known as:  DULCOLAX Place 10 mg rectally daily as needed for moderate constipation.   Buffered Aspirin 325 MG Tabs Take 1 tablet by mouth daily.   buPROPion 150 MG 24 hr tablet Commonly known as:  WELLBUTRIN XL Take 150 mg by mouth daily.   carbidopa-levodopa 50-200 MG tablet Commonly known as:  SINEMET  CR Take 1 tablet by mouth at bedtime.   carbidopa-levodopa 25-100 MG tablet Commonly known as:  SINEMET IR Take 1-2 tablets by mouth 2 (two) times daily as needed.   carbidopa-levodopa 25-100 MG tablet Commonly known as:  SINEMET IR Take 1.5 tablets by mouth 4 (four) times daily.   cholecalciferol 1000 units tablet Commonly known as:  VITAMIN D Take 1,000 Units by mouth daily.   citalopram 20 MG tablet Commonly known as:  CELEXA Take 20 mg by mouth at bedtime.   clonazePAM 1 MG tablet Commonly known as:  KLONOPIN Take 1 mg by mouth every evening.   donepezil 5 MG tablet Commonly known as:  ARICEPT Take 5 mg by mouth daily.   fludrocortisone 0.1 MG tablet Commonly known as:  FLORINEF Take 0.1 mg  by mouth daily.   folic acid 1 MG tablet Commonly known as:  FOLVITE Take 1 mg by mouth daily.   hydrocortisone cream 1 % Apply 1 application topically 4 (four) times daily as needed for itching. Apply thin film to clean, dry hemorrhoids   LORazepam 0.5 MG tablet Commonly known as:  ATIVAN Take 0.5 mg by mouth every 4 (four) hours as needed for anxiety.   magnesium hydroxide 400 MG/5ML suspension Commonly known as:  MILK OF MAGNESIA Take 30 mLs by mouth every 4 (four) hours as needed for mild constipation.   memantine 10 MG tablet Commonly known as:  NAMENDA Take 10 mg by mouth 2 (two) times daily.   morphine 20 MG/ML concentrated solution Commonly known as:  ROXANOL Take 5-10 mg by mouth every hour as needed for severe pain.   potassium chloride 10 MEQ CR capsule Commonly known as:  MICRO-K Take 10 mEq by mouth daily.   predniSONE 5 MG tablet Commonly known as:  DELTASONE Take 5 mg by mouth daily.   sorbitol 70 % solution Take 30 mLs by mouth 2 (two) times daily as needed.   traZODone 100 MG tablet Commonly known as:  DESYREL Take 100 mg by mouth at bedtime as needed for sleep.   vitamin B-12 1000 MCG tablet Commonly known as:  CYANOCOBALAMIN Take 1,000 mcg by mouth daily.       Review of Systems  Constitutional: Positive for fatigue. Negative for activity change, appetite change, chills, diaphoresis and fever.  HENT: Negative.   Respiratory: Positive for shortness of breath. Negative for cough and chest tightness.   Cardiovascular: Negative for chest pain, palpitations and leg swelling.  Gastrointestinal: Positive for diarrhea and nausea.  Genitourinary: Negative.   Musculoskeletal: Positive for arthralgias and gait problem.  Skin: Positive for rash.  Neurological: Positive for tremors, speech difficulty and weakness.  Psychiatric/Behavioral: Negative.   All other systems reviewed and are negative.    There is no immunization history on file for this  patient. Pertinent  Health Maintenance Due  Topic Date Due  . PNA vac Low Risk Adult (1 of 2 - PCV13) 05/28/1997  . INFLUENZA VACCINE  07/30/2016   No flowsheet data found. Functional Status Survey:    There were no vitals filed for this visit. There is no height or weight on file to calculate BMI. Physical Exam  Constitutional: He is oriented to person, place, and time. He appears well-developed and well-nourished. No distress.  HENT:  Head: Normocephalic and atraumatic.  Eyes: Conjunctivae and EOM are normal. Pupils are equal, round, and reactive to light.  Neck: Normal range of motion. Neck supple. No JVD present.  Cardiovascular: Normal rate, regular  rhythm, normal heart sounds and intact distal pulses.  Exam reveals no gallop and no friction rub.   No murmur heard. Pulmonary/Chest: Effort normal and breath sounds normal. No respiratory distress. He has no wheezes. He has no rales. He exhibits no tenderness.  Abdominal: Soft. He exhibits no distension and no pulsatile midline mass. Bowel sounds are increased. There is no tenderness.  Abdomen firm, but non-tender  Musculoskeletal: He exhibits no edema or tenderness.  Neurological: He is alert and oriented to person, place, and time. He exhibits abnormal muscle tone. Coordination (Parkinson's) abnormal.  Skin: Skin is warm. No rash noted. He is not diaphoretic. No cyanosis or erythema. There is pallor. Nails show no clubbing.  Patient has psoriasis on multiple locations of the body. Large red patches on the feet and lower legs. No change from previous assessment.  Psychiatric: He has a normal mood and affect. His behavior is normal. Judgment and thought content normal.  Nursing note and vitals reviewed.   Labs reviewed:  Recent Labs  01/24/16 1455  04/05/16 1157 07/28/16 0730 09/26/16 1435  NA 137  < > 136 139 136  K 4.5  < > 3.4* 3.4* 4.7  CL 98*  < > 100* 103 105  CO2 24  < > 31 30 25   GLUCOSE 82  < > 86 89 142*  BUN  32*  < > 17 14 27*  CREATININE 1.72*  < > 0.83 0.96 1.14  CALCIUM 9.8  < > 8.7* 8.9 8.7*  MG 1.7  --   --   --   --   < > = values in this interval not displayed.  Recent Labs  04/05/16 1157 07/28/16 0730 09/26/16 1435  AST 19 14* 23  ALT 7* <5* <5*  ALKPHOS 83 81 63  BILITOT 0.9 1.1 0.7  PROT 5.8* 6.0* 5.6*  ALBUMIN 3.5 3.7 3.4*    Recent Labs  04/05/16 1157 07/28/16 0730 09/26/16 1435  WBC 8.7 7.4 5.9  NEUTROABS 7.8* 5.6 4.9  HGB 12.6* 12.0* 9.8*  HCT 37.3* 34.5* 28.4*  MCV 101.3* 101.8* 100.4*  PLT 143* 173 186   Lab Results  Component Value Date   TSH 1.819 03/14/2016   No results found for: HGBA1C Lab Results  Component Value Date   CHOL 136 11/29/2015   HDL 44 11/29/2015   LDLCALC 80 11/29/2015   TRIG 59 11/29/2015   CHOLHDL 3.1 11/29/2015    Significant Diagnostic Results in last 30 days:  No results found.  Assessment/Plan 1. Viral gastritis  Xrays to r/o perforation, ileus or obstruction  Supportive care for symptoms  Good hand hygiene  Bland diet until sx resolve  Family/ staff Communication:   Total Time:  Documentation:  Face to Face:  Family/Phone:   Labs/tests ordered: complete abdominal series xrays and 2 view cxr  Medication list reviewed and assessed for continued appropriateness.  Vikki Ports, NP-C Geriatrics [facility] [phone], [place] [address] Cell Phone (Mon-Fri 8am-5pm):  [phone] On Call:  [phone] & follow prompts after 5pm & weekends Office Phone:  [phone] Office Fax:  [phone]

## 2017-01-17 ENCOUNTER — Non-Acute Institutional Stay (SKILLED_NURSING_FACILITY): Payer: Medicare Other | Admitting: Gerontology

## 2017-01-17 DIAGNOSIS — K1121 Acute sialoadenitis: Secondary | ICD-10-CM

## 2017-01-19 DIAGNOSIS — R05 Cough: Secondary | ICD-10-CM | POA: Diagnosis not present

## 2017-01-19 DIAGNOSIS — R509 Fever, unspecified: Secondary | ICD-10-CM | POA: Diagnosis not present

## 2017-01-19 LAB — INFLUENZA PANEL BY PCR (TYPE A & B)
INFLAPCR: POSITIVE — AB
Influenza B By PCR: NEGATIVE

## 2017-01-21 ENCOUNTER — Non-Acute Institutional Stay (SKILLED_NURSING_FACILITY): Payer: Medicare Other | Admitting: Gerontology

## 2017-01-21 DIAGNOSIS — J101 Influenza due to other identified influenza virus with other respiratory manifestations: Secondary | ICD-10-CM | POA: Diagnosis not present

## 2017-01-29 NOTE — Progress Notes (Signed)
Location:      Place of Service:  SNF (31) Provider:  Toni Arthurs, NP-C  SPARKS,JEFFREY D, MD  Patient Care Team: Idelle Crouch, MD as PCP - General (Internal Medicine)  Extended Emergency Contact Information Primary Emergency Contact: Derl Barrow (Daughter)  [place] Phone: [phone] Relation: None  Code Status:  dnr Goals of care: Advanced Directive information Advanced Directives 07/28/2016  Does Patient Have a Medical Advance Directive? Yes  Type of Advance Directive Out of facility DNR (pink MOST or yellow form)  Does patient want to make changes to medical advance directive? No - Patient declined  Copy of [place] in Chart? Yes  Pre-existing out of facility DNR order (yellow form or pink MOST form) Yellow form placed in chart (order not valid for inpatient use)     Chief Complaint  Patient presents with  . Follow-up    HPI:  Pt is a 83 y.o. male seen today for an acute visit for pt c/o malaise, cough, congestion. He started feeling bad last night. Cough is non-productive. Afebrile. C/o general malaise and fatigue. Pt has had one episode of nausea and vomiting. No diarrhea. VSS. No other complaints.     Past Medical History:  Diagnosis Date  . Atrial fibrillation ([place])   . CHF (congestive heart failure) ([place])   . Parkinson's disease ([place])   . Prostate cancer ([place])   . Psoriasis   . Vertigo    No past surgical history on file.  Allergies  Allergen Reactions  . Penicillins     Allergies as of 01/21/2017      Reactions   Penicillins       Medication List       Accurate as of 01/21/17 11:59 PM. Always use your most recent med list.          acetaminophen 325 MG tablet Commonly known as:  TYLENOL Take 650 mg by mouth every 4 (four) hours as needed.   acetaminophen 500 MG tablet Commonly known as:  TYLENOL Take 500 mg by mouth 2 (two) times daily as needed.   alfuzosin 10 MG 24 hr  tablet Commonly known as:  UROXATRAL Take 10 mg by mouth daily.   alum & mag hydroxide-simeth [id number] MG/5ML suspension Commonly known as:  MAALOX PLUS Take 30 mLs by mouth every 4 (four) hours as needed for indigestion.   baclofen 10 MG tablet Commonly known as:  LIORESAL Take 5 mg by mouth 3 (three) times daily.   bisacodyl 10 MG suppository Commonly known as:  DULCOLAX Place 10 mg rectally daily as needed for moderate constipation.   Buffered Aspirin 325 MG Tabs Take 1 tablet by mouth daily.   buPROPion 150 MG 24 hr tablet Commonly known as:  WELLBUTRIN XL Take 150 mg by mouth daily.   carbidopa-levodopa 50-200 MG tablet Commonly known as:  SINEMET CR Take 1 tablet by mouth at bedtime.   carbidopa-levodopa 25-100 MG tablet Commonly known as:  SINEMET IR Take 1-2 tablets by mouth 2 (two) times daily as needed.   carbidopa-levodopa 25-100 MG tablet Commonly known as:  SINEMET IR Take 1.5 tablets by mouth 4 (four) times daily.   cholecalciferol 1000 units tablet Commonly known as:  VITAMIN D Take 1,000 Units by mouth daily.   citalopram 20 MG tablet Commonly known as:  CELEXA Take 20 mg by mouth at bedtime.   clonazePAM 1 MG tablet Commonly known as:  KLONOPIN Take 1 mg by mouth every  evening.   donepezil 5 MG tablet Commonly known as:  ARICEPT Take 5 mg by mouth daily.   fludrocortisone 0.1 MG tablet Commonly known as:  FLORINEF Take 0.1 mg by mouth daily.   folic acid 1 MG tablet Commonly known as:  FOLVITE Take 1 mg by mouth daily.   hydrocortisone cream 1 % Apply 1 application topically 4 (four) times daily as needed for itching. Apply thin film to clean, dry hemorrhoids   LORazepam 0.5 MG tablet Commonly known as:  ATIVAN Take 0.5 mg by mouth every 4 (four) hours as needed for anxiety.   magnesium hydroxide 400 MG/5ML suspension Commonly known as:  MILK OF MAGNESIA Take 30 mLs by mouth every 4 (four) hours as needed for mild constipation.    memantine 10 MG tablet Commonly known as:  NAMENDA Take 10 mg by mouth 2 (two) times daily.   morphine 20 MG/ML concentrated solution Commonly known as:  ROXANOL Take 5-10 mg by mouth every hour as needed for severe pain.   potassium chloride 10 MEQ CR capsule Commonly known as:  MICRO-K Take 10 mEq by mouth daily.   predniSONE 5 MG tablet Commonly known as:  DELTASONE Take 5 mg by mouth daily.   sorbitol 70 % solution Take 30 mLs by mouth 2 (two) times daily as needed.   traZODone 100 MG tablet Commonly known as:  DESYREL Take 100 mg by mouth at bedtime as needed for sleep.   vitamin B-12 1000 MCG tablet Commonly known as:  CYANOCOBALAMIN Take 1,000 mcg by mouth daily.       Review of Systems  Constitutional: Positive for appetite change, diaphoresis and fatigue. Negative for activity change, chills and fever.  HENT: Positive for congestion. Negative for dental problem, drooling, ear discharge, ear pain, facial swelling, mouth sores, postnasal drip, sinus pain, sore throat and trouble swallowing.   Eyes: Negative.   Respiratory: Positive for cough. Negative for chest tightness and shortness of breath.   Cardiovascular: Negative for chest pain, palpitations and leg swelling.  Gastrointestinal: Positive for nausea and vomiting. Negative for diarrhea.  Genitourinary: Negative.   Musculoskeletal: Positive for arthralgias and gait problem.  Skin: Positive for rash.  Neurological: Positive for tremors, speech difficulty and weakness.  Psychiatric/Behavioral: Negative.   All other systems reviewed and are negative.    There is no immunization history on file for this patient. Pertinent  Health Maintenance Due  Topic Date Due  . PNA vac Low Risk Adult (1 of 2 - PCV13) 05/28/1997  . INFLUENZA VACCINE  07/30/2016   No flowsheet data found. Functional Status Survey:    There were no vitals filed for this visit. There is no height or weight on file to calculate  BMI. Physical Exam  Constitutional: He is oriented to person, place, and time. He appears well-developed and well-nourished. No distress.  HENT:  Head: Normocephalic and atraumatic.  Eyes: Conjunctivae and EOM are normal. Pupils are equal, round, and reactive to light.  Neck: Normal range of motion. Neck supple. No JVD present. Erythema present. No thyroid mass present.    Left Parotitis  Cardiovascular: Normal rate, regular rhythm, normal heart sounds and intact distal pulses.  Exam reveals no gallop and no friction rub.   No murmur heard. Pulmonary/Chest: Effort normal and breath sounds normal. No accessory muscle usage. No respiratory distress. He has no decreased breath sounds. He has no wheezes. He has no rhonchi. He has no rales. He exhibits no tenderness.  Abdominal: Soft. He  exhibits no distension and no pulsatile midline mass. Bowel sounds are increased. There is no tenderness.  Abdomen firm, but non-tender  Musculoskeletal: He exhibits no edema or tenderness.  Neurological: He is alert and oriented to person, place, and time. He exhibits abnormal muscle tone. Coordination (Parkinson's) abnormal.  Skin: Skin is warm. No rash noted. He is not diaphoretic. No cyanosis or erythema. There is pallor. Nails show no clubbing.  Patient has psoriasis on multiple locations of the body. Large red patches on the feet and lower legs. No change from previous assessment.  Psychiatric: He has a normal mood and affect. His behavior is normal. Judgment and thought content normal.  Nursing note and vitals reviewed.   Labs reviewed:  Recent Labs  04/05/16 1157 07/28/16 0730 09/26/16 1435  NA 136 139 136  K 3.4* 3.4* 4.7  CL 100* 103 105  CO2 31 30 25   GLUCOSE 86 89 142*  BUN 17 14 27*  CREATININE 0.83 0.96 1.14  CALCIUM 8.7* 8.9 8.7*    Recent Labs  04/05/16 1157 07/28/16 0730 09/26/16 1435  AST 19 14* 23  ALT 7* <5* <5*  ALKPHOS 83 81 63  BILITOT 0.9 1.1 0.7  PROT 5.8* 6.0*  5.6*  ALBUMIN 3.5 3.7 3.4*    Recent Labs  04/05/16 1157 07/28/16 0730 09/26/16 1435  WBC 8.7 7.4 5.9  NEUTROABS 7.8* 5.6 4.9  HGB 12.6* 12.0* 9.8*  HCT 37.3* 34.5* 28.4*  MCV 101.3* 101.8* 100.4*  PLT 143* 173 186   Lab Results  Component Value Date   TSH 1.819 03/14/2016   No results found for: HGBA1C Lab Results  Component Value Date   CHOL 136 11/29/2015   HDL 44 11/29/2015   LDLCALC 80 11/29/2015   TRIG 59 11/29/2015   CHOLHDL 3.1 11/29/2015    Significant Diagnostic Results in last 30 days:  No results found.  Assessment/Plan 1. Influenza A  Tamiflu 75 mg po BID x 5 days  Encourage po fluid intake  Droplet precautions   Family/ staff Communication:   Total Time:  Documentation:  Face to Face:  Family/Phone:   Labs/tests ordered:  Flu test  Medication list reviewed and assessed for continued appropriateness.  Vikki Ports, NP-C Geriatrics [facility] [phone], [place] [address] Cell Phone (Mon-Fri 8am-5pm):  [phone] On Call:  [phone] & follow prompts after 5pm & weekends Office Phone:  [phone] Office Fax:  [phone]

## 2017-01-29 NOTE — Progress Notes (Signed)
Location:      Place of Service:  SNF (31) Provider:  Toni Arthurs, NP-C  SPARKS,JEFFREY D, MD  Patient Care Team: Idelle Crouch, MD as PCP - General (Internal Medicine)  Extended Emergency Contact Information Primary Emergency Contact: Derl Barrow (Daughter)  [place] Phone: [phone] Relation: None  Code Status:  dnr Goals of care: Advanced Directive information Advanced Directives 07/28/2016  Does Patient Have a Medical Advance Directive? Yes  Type of Advance Directive Out of facility DNR (pink MOST or yellow form)  Does patient want to make changes to medical advance directive? No - Patient declined  Copy of [place] in Chart? Yes  Pre-existing out of facility DNR order (yellow form or pink MOST form) Yellow form placed in chart (order not valid for inpatient use)     Chief Complaint  Patient presents with  . Acute Visit    HPI:  Pt is a 83 y.o. male seen today for an acute visit for swelling of the left jaw. Pt reports painful swelling began this morning. Area is warm, tender to touch. Located in the left, submandibular jaw. No drainage. No loose dentition. No obvious foreign bodies. Negative post-auricular pain, swelling. No ear pain. Pt is afebrile. No other complaints. VSS.    Past Medical History:  Diagnosis Date  . Atrial fibrillation ([place])   . CHF (congestive heart failure) ([place])   . Parkinson's disease ([place])   . Prostate cancer ([place])   . Psoriasis   . Vertigo    No past surgical history on file.  Allergies  Allergen Reactions  . Penicillins     Allergies as of 01/17/2017      Reactions   Penicillins       Medication List       Accurate as of 01/17/17 11:59 PM. Always use your most recent med list.          acetaminophen 325 MG tablet Commonly known as:  TYLENOL Take 650 mg by mouth every 4 (four) hours as needed.   acetaminophen 500 MG tablet Commonly known as:  TYLENOL Take 500 mg by  mouth 2 (two) times daily as needed.   alfuzosin 10 MG 24 hr tablet Commonly known as:  UROXATRAL Take 10 mg by mouth daily.   alum & mag hydroxide-simeth [id number] MG/5ML suspension Commonly known as:  MAALOX PLUS Take 30 mLs by mouth every 4 (four) hours as needed for indigestion.   baclofen 10 MG tablet Commonly known as:  LIORESAL Take 5 mg by mouth 3 (three) times daily.   bisacodyl 10 MG suppository Commonly known as:  DULCOLAX Place 10 mg rectally daily as needed for moderate constipation.   Buffered Aspirin 325 MG Tabs Take 1 tablet by mouth daily.   buPROPion 150 MG 24 hr tablet Commonly known as:  WELLBUTRIN XL Take 150 mg by mouth daily.   carbidopa-levodopa 50-200 MG tablet Commonly known as:  SINEMET CR Take 1 tablet by mouth at bedtime.   carbidopa-levodopa 25-100 MG tablet Commonly known as:  SINEMET IR Take 1-2 tablets by mouth 2 (two) times daily as needed.   carbidopa-levodopa 25-100 MG tablet Commonly known as:  SINEMET IR Take 1.5 tablets by mouth 4 (four) times daily.   cholecalciferol 1000 units tablet Commonly known as:  VITAMIN D Take 1,000 Units by mouth daily.   citalopram 20 MG tablet Commonly known as:  CELEXA Take 20 mg by mouth at bedtime.   clonazePAM 1 MG  tablet Commonly known as:  KLONOPIN Take 1 mg by mouth every evening.   donepezil 5 MG tablet Commonly known as:  ARICEPT Take 5 mg by mouth daily.   fludrocortisone 0.1 MG tablet Commonly known as:  FLORINEF Take 0.1 mg by mouth daily.   folic acid 1 MG tablet Commonly known as:  FOLVITE Take 1 mg by mouth daily.   hydrocortisone cream 1 % Apply 1 application topically 4 (four) times daily as needed for itching. Apply thin film to clean, dry hemorrhoids   LORazepam 0.5 MG tablet Commonly known as:  ATIVAN Take 0.5 mg by mouth every 4 (four) hours as needed for anxiety.   magnesium hydroxide 400 MG/5ML suspension Commonly known as:  MILK OF MAGNESIA Take 30 mLs by  mouth every 4 (four) hours as needed for mild constipation.   memantine 10 MG tablet Commonly known as:  NAMENDA Take 10 mg by mouth 2 (two) times daily.   morphine 20 MG/ML concentrated solution Commonly known as:  ROXANOL Take 5-10 mg by mouth every hour as needed for severe pain.   potassium chloride 10 MEQ CR capsule Commonly known as:  MICRO-K Take 10 mEq by mouth daily.   predniSONE 5 MG tablet Commonly known as:  DELTASONE Take 5 mg by mouth daily.   sorbitol 70 % solution Take 30 mLs by mouth 2 (two) times daily as needed.   traZODone 100 MG tablet Commonly known as:  DESYREL Take 100 mg by mouth at bedtime as needed for sleep.   vitamin B-12 1000 MCG tablet Commonly known as:  CYANOCOBALAMIN Take 1,000 mcg by mouth daily.       Review of Systems  Constitutional: Positive for fatigue. Negative for activity change, appetite change, chills, diaphoresis and fever.  HENT: Positive for facial swelling. Negative for congestion, dental problem, drooling, ear discharge, ear pain, mouth sores, postnasal drip, sore throat and trouble swallowing.   Eyes: Negative.   Respiratory: Negative for cough, chest tightness and shortness of breath.   Cardiovascular: Negative for chest pain, palpitations and leg swelling.  Gastrointestinal: Negative for diarrhea and nausea.  Genitourinary: Negative.   Musculoskeletal: Positive for arthralgias and gait problem.  Skin: Positive for rash.  Neurological: Positive for tremors, speech difficulty and weakness.  Psychiatric/Behavioral: Negative.   All other systems reviewed and are negative.    There is no immunization history on file for this patient. Pertinent  Health Maintenance Due  Topic Date Due  . PNA vac Low Risk Adult (1 of 2 - PCV13) 05/28/1997  . INFLUENZA VACCINE  07/30/2016   No flowsheet data found. Functional Status Survey:    There were no vitals filed for this visit. There is no height or weight on file to  calculate BMI. Physical Exam  Constitutional: He is oriented to person, place, and time. He appears well-developed and well-nourished. No distress.  HENT:  Head: Normocephalic and atraumatic.  Eyes: Conjunctivae and EOM are normal. Pupils are equal, round, and reactive to light.  Neck: Normal range of motion. Neck supple. No JVD present. Erythema present. No thyroid mass present.    Left Parotitis  Cardiovascular: Normal rate, regular rhythm, normal heart sounds and intact distal pulses.  Exam reveals no gallop and no friction rub.   No murmur heard. Pulmonary/Chest: Effort normal and breath sounds normal. No respiratory distress. He has no wheezes. He has no rales. He exhibits no tenderness.  Abdominal: Soft. He exhibits no distension and no pulsatile midline mass. Bowel sounds  are increased. There is no tenderness.  Abdomen firm, but non-tender  Musculoskeletal: He exhibits no edema or tenderness.  Neurological: He is alert and oriented to person, place, and time. He exhibits abnormal muscle tone. Coordination (Parkinson's) abnormal.  Skin: Skin is warm. No rash noted. He is not diaphoretic. No cyanosis or erythema. There is pallor. Nails show no clubbing.  Patient has psoriasis on multiple locations of the body. Large red patches on the feet and lower legs. No change from previous assessment.  Psychiatric: He has a normal mood and affect. His behavior is normal. Judgment and thought content normal.  Nursing note and vitals reviewed.   Labs reviewed:  Recent Labs  04/05/16 1157 07/28/16 0730 09/26/16 1435  NA 136 139 136  K 3.4* 3.4* 4.7  CL 100* 103 105  CO2 31 30 25   GLUCOSE 86 89 142*  BUN 17 14 27*  CREATININE 0.83 0.96 1.14  CALCIUM 8.7* 8.9 8.7*    Recent Labs  04/05/16 1157 07/28/16 0730 09/26/16 1435  AST 19 14* 23  ALT 7* <5* <5*  ALKPHOS 83 81 63  BILITOT 0.9 1.1 0.7  PROT 5.8* 6.0* 5.6*  ALBUMIN 3.5 3.7 3.4*    Recent Labs  04/05/16 1157  07/28/16 0730 09/26/16 1435  WBC 8.7 7.4 5.9  NEUTROABS 7.8* 5.6 4.9  HGB 12.6* 12.0* 9.8*  HCT 37.3* 34.5* 28.4*  MCV 101.3* 101.8* 100.4*  PLT 143* 173 186   Lab Results  Component Value Date   TSH 1.819 03/14/2016   No results found for: HGBA1C Lab Results  Component Value Date   CHOL 136 11/29/2015   HDL 44 11/29/2015   LDLCALC 80 11/29/2015   TRIG 59 11/29/2015   CHOLHDL 3.1 11/29/2015    Significant Diagnostic Results in last 30 days:  No results found.  Assessment/Plan 1. Parotitis, acute  Clindamycin 300 mg  1 capsule po TID x 7 days  Lemon drops/ sour candy po Q 2 hours while awake- hold candy in left jaw  Warm, moist compresses with gentle massage to left jaw QID x 7 days    Family/ staff Communication:   Total Time:  Documentation:  Face to Face:  Family/Phone:   Labs/tests ordered:    Medication list reviewed and assessed for continued appropriateness.  Vikki Ports, NP-C Geriatrics [facility] [phone], [place] [address] Cell Phone (Mon-Fri 8am-5pm):  [phone] On Call:  [phone] & follow prompts after 5pm & weekends Office Phone:  [phone] Office Fax:  [phone]

## 2017-01-30 ENCOUNTER — Encounter
Admission: RE | Admit: 2017-01-30 | Discharge: 2017-01-30 | Disposition: A | Discharge status: Home / Self Care | Payer: Medicare Other | Source: Ambulatory Visit | Attending: Internal Medicine | Admitting: Internal Medicine

## 2017-02-27 ENCOUNTER — Inpatient Hospital Stay: Admit: 2017-02-27 | Payer: Self-pay

## 2017-02-27 ENCOUNTER — Encounter
Admission: RE | Admit: 2017-02-27 | Discharge: 2017-02-27 | Disposition: A | Discharge status: Home / Self Care | Payer: Medicare Other | Source: Ambulatory Visit | Attending: Internal Medicine | Admitting: Internal Medicine

## 2017-03-06 ENCOUNTER — Non-Acute Institutional Stay (SKILLED_NURSING_FACILITY): Payer: Medicare Other | Admitting: Gerontology

## 2017-03-06 DIAGNOSIS — E785 Hyperlipidemia, unspecified: Secondary | ICD-10-CM

## 2017-03-06 DIAGNOSIS — G4752 REM sleep behavior disorder: Secondary | ICD-10-CM

## 2017-03-06 DIAGNOSIS — F0280 Dementia in other diseases classified elsewhere without behavioral disturbance: Secondary | ICD-10-CM | POA: Diagnosis not present

## 2017-03-06 DIAGNOSIS — R2689 Other abnormalities of gait and mobility: Secondary | ICD-10-CM

## 2017-03-06 DIAGNOSIS — K5904 Chronic idiopathic constipation: Secondary | ICD-10-CM

## 2017-03-06 DIAGNOSIS — I38 Endocarditis, valve unspecified: Secondary | ICD-10-CM

## 2017-03-06 DIAGNOSIS — D649 Anemia, unspecified: Secondary | ICD-10-CM

## 2017-03-06 DIAGNOSIS — G20 Parkinson's disease: Secondary | ICD-10-CM | POA: Diagnosis not present

## 2017-03-06 DIAGNOSIS — L409 Psoriasis, unspecified: Secondary | ICD-10-CM | POA: Diagnosis not present

## 2017-03-06 DIAGNOSIS — D693 Immune thrombocytopenic purpura: Secondary | ICD-10-CM

## 2017-03-06 DIAGNOSIS — F419 Anxiety disorder, unspecified: Secondary | ICD-10-CM

## 2017-03-06 DIAGNOSIS — I4891 Unspecified atrial fibrillation: Secondary | ICD-10-CM

## 2017-03-06 DIAGNOSIS — F324 Major depressive disorder, single episode, in partial remission: Secondary | ICD-10-CM | POA: Diagnosis not present

## 2017-03-06 DIAGNOSIS — L4050 Arthropathic psoriasis, unspecified: Secondary | ICD-10-CM

## 2017-03-06 DIAGNOSIS — M48061 Spinal stenosis, lumbar region without neurogenic claudication: Secondary | ICD-10-CM

## 2017-03-06 DIAGNOSIS — G629 Polyneuropathy, unspecified: Secondary | ICD-10-CM

## 2017-03-06 DIAGNOSIS — I5020 Unspecified systolic (congestive) heart failure: Secondary | ICD-10-CM

## 2017-03-06 DIAGNOSIS — G20A1 Parkinson's disease without dyskinesia, without mention of fluctuations: Secondary | ICD-10-CM

## 2017-03-06 DIAGNOSIS — M51369 Other intervertebral disc degeneration, lumbar region without mention of lumbar back pain or lower extremity pain: Secondary | ICD-10-CM

## 2017-03-06 DIAGNOSIS — C61 Malignant neoplasm of prostate: Secondary | ICD-10-CM

## 2017-03-06 DIAGNOSIS — M5136 Other intervertebral disc degeneration, lumbar region: Secondary | ICD-10-CM

## 2017-03-25 DIAGNOSIS — D649 Anemia, unspecified: Secondary | ICD-10-CM | POA: Insufficient documentation

## 2017-03-25 DIAGNOSIS — K5904 Chronic idiopathic constipation: Secondary | ICD-10-CM | POA: Insufficient documentation

## 2017-03-25 DIAGNOSIS — F0280 Dementia in other diseases classified elsewhere without behavioral disturbance: Secondary | ICD-10-CM | POA: Insufficient documentation

## 2017-03-25 DIAGNOSIS — F339 Major depressive disorder, recurrent, unspecified: Secondary | ICD-10-CM | POA: Insufficient documentation

## 2017-03-25 DIAGNOSIS — D693 Immune thrombocytopenic purpura: Secondary | ICD-10-CM | POA: Insufficient documentation

## 2017-03-25 DIAGNOSIS — R2689 Other abnormalities of gait and mobility: Secondary | ICD-10-CM | POA: Insufficient documentation

## 2017-03-25 NOTE — Progress Notes (Signed)
Location:      Place of Service:  SNF (31) Provider:  Toni Arthurs, NP-C  [name],[name] D, MD  Patient Care Team: Idelle Crouch, MD as PCP - General (Internal Medicine)  Extended Emergency Contact Information Primary Emergency Contact: [name] [name] (Daughter)  [place] Phone: [phone] Relation: None  Code Status:  dnr Goals of care: Advanced Directive information Advanced Directives 07/28/2016  Does Patient Have a Medical Advance Directive? Yes  Type of Advance Directive Out of facility DNR (pink MOST or yellow form)  Does patient want to make changes to medical advance directive? No - Patient declined  Copy of [place] in Chart? Yes  Pre-existing out of facility DNR order (yellow form or pink MOST form) Yellow form placed in chart (order not valid for inpatient use)     Chief Complaint  Patient presents with  . Medical Management of Chronic Issues    HPI:  Pt is a 83 y.o. [name] seen today for medical management of chronic diseases. Pt has been stable over the past month with the chronic diseases. Pt has not had any falls this month. His tremors wax and wane. He has a Actuary that is with him the majority of the day and helps him with all his needs. He did have a few days of increased confusion when he accused his long-time sitter/companion of stealing. However, this is unusual for him. Will monitor this. Otherwise, pt reports he is feeling well. He continues on Hospice services. 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 Pt was very exxcited about this. Typically, he is very sweet and always smiling. Denies pain or any other complaints at this time. VSS.     Past Medical History:  Diagnosis Date  . Atrial fibrillation ([place])   . CHF (congestive heart failure) ([place])   . Parkinson's disease ([place])   . Prostate cancer ([place])   . Psoriasis   . Vertigo    No past surgical history on  file.  Allergies  Allergen Reactions  . Penicillins     Allergies as of 03/06/2017      Reactions   Penicillins       Medication List       Accurate as of 03/06/17 11:[name] PM. Always use your most recent med list.          acetaminophen 325 MG tablet Commonly known as:  TYLENOL Take 650 mg by mouth every 4 (four) hours as needed.   acetaminophen 500 MG tablet Commonly known as:  TYLENOL Take 500 mg by mouth 2 (two) times daily as needed.   alfuzosin 10 MG 24 hr tablet Commonly known as:  UROXATRAL Take 10 mg by mouth daily.   alum & mag hydroxide-simeth 601-093-23 MG/5ML suspension Commonly known as:  MAALOX PLUS Take 30 mLs by mouth every 4 (four) hours as needed for indigestion.   baclofen 10 MG tablet Commonly known as:  LIORESAL Take 5 mg by mouth 3 (three) times daily.   bisacodyl 10 MG suppository Commonly known as:  DULCOLAX Place 10 mg rectally daily as needed for moderate constipation.   Buffered Aspirin 325 MG Tabs Take 1 tablet by mouth daily.   buPROPion 150 MG 24 hr tablet Commonly known as:  WELLBUTRIN XL Take 150 mg by mouth daily.   carbidopa-levodopa 50-200 MG tablet Commonly known as:  SINEMET CR Take 1 tablet by mouth at bedtime.   carbidopa-levodopa 25-100 MG  tablet Commonly known as:  SINEMET IR Take 1-2 tablets by mouth 2 (two) times daily as needed.   carbidopa-levodopa 25-100 MG tablet Commonly known as:  SINEMET IR Take 1.5 tablets by mouth 4 (four) times daily.   cholecalciferol 1000 units tablet Commonly known as:  VITAMIN D Take 1,000 Units by mouth daily.   citalopram 20 MG tablet Commonly known as:  CELEXA Take 20 mg by mouth at bedtime.   clonazePAM 1 MG tablet Commonly known as:  KLONOPIN Take 1 mg by mouth every evening.   donepezil 5 MG tablet Commonly known as:  ARICEPT Take 5 mg by mouth daily.   fludrocortisone 0.1 MG tablet Commonly known as:  FLORINEF Take 0.1 mg by mouth daily.   folic acid 1 MG  tablet Commonly known as:  FOLVITE Take 1 mg by mouth daily.   hydrocortisone cream 1 % Apply 1 application topically 4 (four) times daily as needed for itching. Apply thin film to clean, dry hemorrhoids   LORazepam 0.5 MG tablet Commonly known as:  ATIVAN Take 0.5 mg by mouth every 4 (four) hours as needed for anxiety.   magnesium hydroxide 400 MG/5ML suspension Commonly known as:  MILK OF MAGNESIA Take 30 mLs by mouth every 4 (four) hours as needed for mild constipation.   memantine 10 MG tablet Commonly known as:  NAMENDA Take 10 mg by mouth 2 (two) times daily.   morphine 20 MG/ML concentrated solution Commonly known as:  ROXANOL Take 5-10 mg by mouth every hour as needed for severe pain.   potassium chloride 10 MEQ CR capsule Commonly known as:  MICRO-K Take 10 mEq by mouth daily.   predniSONE 5 MG tablet Commonly known as:  DELTASONE Take 5 mg by mouth daily.   sorbitol 70 % solution Take 30 mLs by mouth 2 (two) times daily as needed.   traZODone 100 MG tablet Commonly known as:  DESYREL Take 100 mg by mouth at bedtime as needed for sleep.   vitamin B-12 1000 MCG tablet Commonly known as:  CYANOCOBALAMIN Take 1,000 mcg by mouth daily.       Review of Systems  Constitutional: Negative for activity change, appetite change, chills, diaphoresis, fatigue and fever.  HENT: Negative for congestion, dental problem, drooling, ear discharge, ear pain, facial swelling, mouth sores, postnasal drip, sinus pain, sore throat and trouble swallowing.   Eyes: Negative.   Respiratory: Negative for cough, chest tightness and shortness of breath.   Cardiovascular: Negative for chest pain, palpitations and leg swelling.  Gastrointestinal: Negative for diarrhea, nausea and vomiting.  Genitourinary: Negative.   Musculoskeletal: Positive for arthralgias and gait problem.  Skin: Positive for rash (psoriasis).  Neurological: Positive for tremors, speech difficulty and weakness.   Psychiatric/Behavioral: Negative.   All other systems reviewed and are negative.    There is no immunization history on file for this patient. Pertinent  Health Maintenance Due  Topic Date Due  . PNA vac Low Risk Adult (1 of 2 - PCV13) 05/28/1997  . INFLUENZA VACCINE  07/30/2016   No flowsheet data found. Functional Status Survey:    Vitals:   02/24/17 1000  BP: (!) 112/58  Pulse: 63  Resp: 17  Temp: 97.3 F (36.3 C)  SpO2: 94%   There is no height or weight on file to calculate BMI. Physical Exam  Constitutional: He is oriented to person, place, and time. He appears well-developed and well-nourished. No distress.  HENT:  Head: Normocephalic and atraumatic.  Eyes:  Conjunctivae and EOM are normal. Pupils are equal, round, and reactive to light.  Neck: Normal range of motion. Neck supple. No JVD present. Erythema present. No thyroid mass present.    Left Parotitis  Cardiovascular: Normal rate, regular rhythm, normal heart sounds and intact distal pulses.  Exam reveals no gallop and no friction rub.   No murmur heard. Pulmonary/Chest: Effort normal and breath sounds normal. No accessory muscle usage. No respiratory distress. He has no decreased breath sounds. He has no wheezes. He has no rhonchi. He has no rales. He exhibits no tenderness.  Abdominal: Soft. He exhibits no distension and no pulsatile midline mass. Bowel sounds are increased. There is no tenderness.  Abdomen firm, but non-tender  Musculoskeletal: He exhibits no edema or tenderness.  Neurological: He is alert and oriented to person, place, and time. He exhibits abnormal muscle tone. Coordination (Parkinson's) abnormal.  Skin: Skin is warm. No rash noted. He is not diaphoretic. No cyanosis or erythema. There is pallor. Nails show no clubbing.  Patient has psoriasis on multiple locations of the body. Large red patches on the feet and lower legs. No change from previous assessment.  Psychiatric: He has a normal  mood and affect. His behavior is normal. Judgment and thought content normal.  Nursing note and vitals reviewed.   Labs reviewed:  Recent Labs  04/05/16 1157 07/28/16 0730 09/26/16 1435  NA 136 139 136  K 3.4* 3.4* 4.7  CL 100* 103 105  CO2 31 30 25   GLUCOSE 86 89 142*  BUN 17 14 27*  CREATININE 0.83 0.96 1.14  CALCIUM 8.7* 8.9 8.7*    Recent Labs  04/05/16 1157 07/28/16 0730 09/26/16 1435  AST 19 14* 23  ALT 7* <5* <5*  ALKPHOS 83 81 63  BILITOT 0.9 1.1 0.7  PROT 5.8* 6.0* 5.6*  ALBUMIN 3.5 3.7 3.4*    Recent Labs  04/05/16 1157 07/28/16 0730 09/26/16 1435  WBC 8.7 7.4 5.9  NEUTROABS 7.8* 5.6 4.9  HGB 12.6* 12.0* 9.8*  HCT 37.3* 34.5* 28.4*  MCV 101.3* 101.8* 100.4*  PLT 143* 173 186   Lab Results  Component Value Date   TSH 1.819 03/14/2016   No results found for: HGBA1C Lab Results  Component Value Date   CHOL 136 11/29/2015   HDL 44 11/29/2015   LDLCALC 80 11/29/2015   TRIG [name] 11/29/2015   CHOLHDL 3.1 11/29/2015    Significant Diagnostic Results in last 30 days:  No results found.  Assessment/Plan 1. Parkinson's disease (HCC)  Continue Carbidopa-Levodopa ER 50-200- 1 tablet po Q HS  Continue Carbidopa-Levodopa 25-100 1 1/2 tablets QID  Continue Midodrine 2.5 mg tablet po TID  Continue Carbidopa-Levodopa 25-100 1-2 tablets BID PRN- excessive tremors  2. Dementia in other diseases classified elsewhere without behavioral disturbance  Stable   3. Major depressive disorder, single episode, in partial remission (HCC)  Stable  Continue Celexa 20 mg po Q HS  Continue Wellbutrin XL 150 mg po Q Day  4. Atrial fibrillation, unspecified type (HCC)  Stable  Continue Aspirin 325 mg po Q Day  5. Anemia, unspecified type  Stable   Ensure 1 can po Q Day  6. Heart valve disease  Continue Aspirin 325 mg po Q Day  7. Systolic congestive heart failure, unspecified congestive heart failure chronicity (HCC)  Stable   8.  Polyneuropathy (HCC)  Stable   9. Psoriatic arthritis (HCC)  Continue Tylenol 500 mg po BID  Continue Morphine 20 mg/ml 0.25-0.5 ml po  Q 1 hour prn  10. Degeneration of intervertebral disc of lumbar region 11. Spinal stenosis, lumbar region, without neurogenic claudication  Continue Tylenol 500 mg po BID  Continue Morphine 20 mg/ml 0.25-0.5 ml po Q 1 hour prn  12. Psoriasis  Continue Triamcinolone 0.1% thin film to areas of psoriasis BID prn  Continue Neutrogena T-Gel (coal tar) [OTC] shampoo; 0.5 %; amt: enough to lather twice weekly  13. Hyperlipidemia, unspecified hyperlipidemia type  Stable   14. Impaired gait and mobility  Assist with ADLs  Use Hoyer lift for transfers from the bed to chair and to the BR  15. Anxiety  Continue Clonazepam - Schedule IV tablet; 1 mg; amt: 1 tablet PO Q HS  16. REM sleep behavior disorder  Trazodone 100 mg po Q HS  17.  Malignant Neoplasm of Prostate  Continue Alfuzosin tablet extended release 24 hr; 10 mg po Q Day  UTI Stat- 30 ml po Q day for UTI prevention  18. 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 Monthly medication orders reviewed and signed.  Vikki Ports, NP-C Geriatrics [facility] [phone], [place] [address] Cell Phone (Mon-Fri 8am-5pm):  [phone] On Call:  [phone] & follow prompts after 5pm & weekends Office Phone:  [phone] Office Fax:  [phone]

## 2017-03-30 ENCOUNTER — Encounter
Admission: RE | Admit: 2017-03-30 | Discharge: 2017-03-30 | Disposition: A | Discharge status: Home / Self Care | Source: Ambulatory Visit | Attending: Internal Medicine | Admitting: Internal Medicine

## 2017-03-30 DIAGNOSIS — R300 Dysuria: Secondary | ICD-10-CM | POA: Insufficient documentation

## 2017-04-09 ENCOUNTER — Non-Acute Institutional Stay (SKILLED_NURSING_FACILITY): Payer: Medicare Other | Admitting: Gerontology

## 2017-04-09 DIAGNOSIS — F0280 Dementia in other diseases classified elsewhere without behavioral disturbance: Secondary | ICD-10-CM | POA: Diagnosis not present

## 2017-04-09 DIAGNOSIS — G629 Polyneuropathy, unspecified: Secondary | ICD-10-CM | POA: Diagnosis not present

## 2017-04-09 DIAGNOSIS — L409 Psoriasis, unspecified: Secondary | ICD-10-CM

## 2017-04-09 DIAGNOSIS — K5904 Chronic idiopathic constipation: Secondary | ICD-10-CM

## 2017-04-09 DIAGNOSIS — I5020 Unspecified systolic (congestive) heart failure: Secondary | ICD-10-CM

## 2017-04-09 DIAGNOSIS — M48061 Spinal stenosis, lumbar region without neurogenic claudication: Secondary | ICD-10-CM | POA: Diagnosis not present

## 2017-04-09 DIAGNOSIS — I4891 Unspecified atrial fibrillation: Secondary | ICD-10-CM | POA: Diagnosis not present

## 2017-04-09 DIAGNOSIS — D649 Anemia, unspecified: Secondary | ICD-10-CM | POA: Diagnosis not present

## 2017-04-09 DIAGNOSIS — R2689 Other abnormalities of gait and mobility: Secondary | ICD-10-CM

## 2017-04-09 DIAGNOSIS — G4752 REM sleep behavior disorder: Secondary | ICD-10-CM

## 2017-04-09 DIAGNOSIS — E785 Hyperlipidemia, unspecified: Secondary | ICD-10-CM

## 2017-04-09 DIAGNOSIS — M5136 Other intervertebral disc degeneration, lumbar region: Secondary | ICD-10-CM | POA: Diagnosis not present

## 2017-04-09 DIAGNOSIS — D693 Immune thrombocytopenic purpura: Secondary | ICD-10-CM

## 2017-04-09 DIAGNOSIS — L4050 Arthropathic psoriasis, unspecified: Secondary | ICD-10-CM | POA: Diagnosis not present

## 2017-04-09 DIAGNOSIS — G20 Parkinson's disease: Secondary | ICD-10-CM | POA: Diagnosis not present

## 2017-04-09 DIAGNOSIS — G20A1 Parkinson's disease without dyskinesia, without mention of fluctuations: Secondary | ICD-10-CM

## 2017-04-09 DIAGNOSIS — F324 Major depressive disorder, single episode, in partial remission: Secondary | ICD-10-CM

## 2017-04-09 DIAGNOSIS — I38 Endocarditis, valve unspecified: Secondary | ICD-10-CM

## 2017-04-09 DIAGNOSIS — F419 Anxiety disorder, unspecified: Secondary | ICD-10-CM

## 2017-04-09 DIAGNOSIS — M51369 Other intervertebral disc degeneration, lumbar region without mention of lumbar back pain or lower extremity pain: Secondary | ICD-10-CM

## 2017-04-09 DIAGNOSIS — C61 Malignant neoplasm of prostate: Secondary | ICD-10-CM

## 2017-04-10 DIAGNOSIS — R300 Dysuria: Secondary | ICD-10-CM | POA: Diagnosis not present

## 2017-04-10 LAB — URINALYSIS, COMPLETE (UACMP) WITH MICROSCOPIC
Bilirubin Urine: NEGATIVE
Glucose, UA: NEGATIVE mg/dL
Hgb urine dipstick: NEGATIVE
KETONES UR: 5 mg/dL — AB
LEUKOCYTES UA: NEGATIVE
Nitrite: NEGATIVE
PROTEIN: NEGATIVE mg/dL
SQUAMOUS EPITHELIAL / LPF: NONE SEEN
Specific Gravity, Urine: 1.023 (ref 1.005–1.030)
pH: 5 (ref 5.0–8.0)

## 2017-04-12 LAB — URINE CULTURE: Culture: NO GROWTH

## 2017-04-16 DIAGNOSIS — E440 Moderate protein-calorie malnutrition: Secondary | ICD-10-CM | POA: Insufficient documentation

## 2017-04-21 NOTE — Progress Notes (Signed)
Location:      Place of Service:  SNF (31) Provider:  Toni Arthurs, NP-C  [name],[name] D, MD  Patient Care Team: Idelle Crouch, MD as PCP - General (Internal Medicine)  Extended Emergency Contact Information Primary Emergency Contact: Derl Barrow (Daughter)  [place] Phone: [phone] Relation: None  Code Status:  dnr Goals of care: Advanced Directive information Advanced Directives 07/28/2016  Does Patient Have a Medical Advance Directive? Yes  Type of Advance Directive Out of facility DNR (pink MOST or yellow form)  Does patient want to make changes to medical advance directive? No - Patient declined  Copy of [place] in Chart? Yes  Pre-existing out of facility DNR order (yellow form or pink MOST form) Yellow form placed in chart (order not valid for inpatient use)     Chief Complaint  Patient presents with  . Medical Management of Chronic Issues    HPI:  Pt is a 83 y.o. male seen today for medical management of chronic diseases. Pt has been stable over the past month with the chronic diseases. Pt has not had any falls this month. His tremors wax and wane. He has a Actuary that is with him the majority of the day and helps him with all his needs. He did have a few days of increased confusion when he accused his long-time sitter/companion of stealing. However, this is unusual for him. Will monitor this. Otherwise, pt reports he is feeling well. He continues on Hospice services. 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 Pt was very excited about this. Typically, he is very sweet and always smiling. Pt did have sx of UTI earlier in the month, but UA, C&S were negative. Pt continues on Hospice services. Denies pain or any other complaints at this time. VSS.     Past Medical History:  Diagnosis Date  . Atrial fibrillation ([place])   . CHF (congestive heart failure) ([place])   . Parkinson's disease  ([place])   . Prostate cancer ([place])   . Psoriasis   . Vertigo    No past surgical history on file.  Allergies  Allergen Reactions  . Penicillins     Allergies as of 04/09/2017      Reactions   Penicillins       Medication List       Accurate as of 04/09/17 11:59 PM. Always use your most recent med list.          acetaminophen 325 MG tablet Commonly known as:  TYLENOL Take 650 mg by mouth every 4 (four) hours as needed.   acetaminophen 500 MG tablet Commonly known as:  TYLENOL Take 500 mg by mouth 2 (two) times daily as needed.   alfuzosin 10 MG 24 hr tablet Commonly known as:  UROXATRAL Take 10 mg by mouth daily.   alum & mag hydroxide-simeth 332-951-88 MG/5ML suspension Commonly known as:  MAALOX PLUS Take 30 mLs by mouth every 4 (four) hours as needed for indigestion.   baclofen 10 MG tablet Commonly known as:  LIORESAL Take 5 mg by mouth 3 (three) times daily.   bisacodyl 10 MG suppository Commonly known as:  DULCOLAX Place 10 mg rectally daily as needed for moderate constipation.   Buffered Aspirin 325 MG Tabs Take 1 tablet by mouth daily.   buPROPion 150 MG 24 hr tablet Commonly known as:  WELLBUTRIN XL Take 150 mg by mouth daily.   carbidopa-levodopa 50-200  MG tablet Commonly known as:  SINEMET CR Take 1 tablet by mouth at bedtime.   carbidopa-levodopa 25-100 MG tablet Commonly known as:  SINEMET IR Take 1-2 tablets by mouth 2 (two) times daily as needed.   carbidopa-levodopa 25-100 MG tablet Commonly known as:  SINEMET IR Take 1.5 tablets by mouth 4 (four) times daily.   cholecalciferol 1000 units tablet Commonly known as:  VITAMIN D Take 1,000 Units by mouth daily.   citalopram 20 MG tablet Commonly known as:  CELEXA Take 20 mg by mouth at bedtime.   clonazePAM 1 MG tablet Commonly known as:  KLONOPIN Take 1 mg by mouth every evening.   donepezil 5 MG tablet Commonly known as:  ARICEPT Take 5 mg by mouth daily.   fludrocortisone  0.1 MG tablet Commonly known as:  FLORINEF Take 0.1 mg by mouth daily.   folic acid 1 MG tablet Commonly known as:  FOLVITE Take 1 mg by mouth daily.   hydrocortisone cream 1 % Apply 1 application topically 4 (four) times daily as needed for itching. Apply thin film to clean, dry hemorrhoids   LORazepam 0.5 MG tablet Commonly known as:  ATIVAN Take 0.5 mg by mouth every 4 (four) hours as needed for anxiety.   magnesium hydroxide 400 MG/5ML suspension Commonly known as:  MILK OF MAGNESIA Take 30 mLs by mouth every 4 (four) hours as needed for mild constipation.   memantine 10 MG tablet Commonly known as:  NAMENDA Take 10 mg by mouth 2 (two) times daily.   morphine 20 MG/ML concentrated solution Commonly known as:  ROXANOL Take 5-10 mg by mouth every hour as needed for severe pain.   potassium chloride 10 MEQ CR capsule Commonly known as:  MICRO-K Take 10 mEq by mouth daily.   predniSONE 5 MG tablet Commonly known as:  DELTASONE Take 5 mg by mouth daily.   sorbitol 70 % solution Take 30 mLs by mouth 2 (two) times daily as needed.   traZODone 100 MG tablet Commonly known as:  DESYREL Take 100 mg by mouth at bedtime as needed for sleep.   vitamin B-12 1000 MCG tablet Commonly known as:  CYANOCOBALAMIN Take 1,000 mcg by mouth daily.       Review of Systems  Constitutional: Negative for activity change, appetite change, chills, diaphoresis, fatigue and fever.  HENT: Negative for congestion, dental problem, drooling, ear discharge, ear pain, facial swelling, mouth sores, postnasal drip, sinus pain, sore throat and trouble swallowing.   Eyes: Negative.   Respiratory: Negative for cough, chest tightness and shortness of breath.   Cardiovascular: Negative for chest pain, palpitations and leg swelling.  Gastrointestinal: Negative for diarrhea, nausea and vomiting.  Genitourinary: Negative.   Musculoskeletal: Positive for arthralgias and gait problem.  Skin: Positive for  rash (psoriasis).  Neurological: Positive for tremors, speech difficulty and weakness.  Psychiatric/Behavioral: Negative.   All other systems reviewed and are negative.    There is no immunization history on file for this patient. Pertinent  Health Maintenance Due  Topic Date Due  . PNA vac Low Risk Adult (1 of 2 - PCV13) 05/28/1997  . INFLUENZA VACCINE  07/30/2017   No flowsheet data found. Functional Status Survey:    Vitals:   04/07/17 1200  BP: 121/74  Pulse: 66  Resp: 20  Temp: 98.4 F (36.9 C)  SpO2: 94%  Weight: 149 lb 1.6 oz (67.6 kg)   Body mass index is 22.67 kg/m. Physical Exam  Constitutional: He is  oriented to person, place, and time. He appears well-developed and well-nourished. No distress.  HENT:  Head: Normocephalic and atraumatic.  Eyes: Conjunctivae and EOM are normal. Pupils are equal, round, and reactive to light.  Neck: Normal range of motion. Neck supple. No JVD present. Erythema present. No thyroid mass present.    Left Parotitis  Cardiovascular: Normal rate, regular rhythm, normal heart sounds and intact distal pulses.  Exam reveals no gallop and no friction rub.   No murmur heard. Pulmonary/Chest: Effort normal and breath sounds normal. No accessory muscle usage. No respiratory distress. He has no decreased breath sounds. He has no wheezes. He has no rhonchi. He has no rales. He exhibits no tenderness.  Abdominal: Soft. He exhibits no distension and no pulsatile midline mass. Bowel sounds are increased. There is no tenderness.  Abdomen firm, but non-tender  Musculoskeletal: He exhibits no edema or tenderness.  Neurological: He is alert and oriented to person, place, and time. He exhibits abnormal muscle tone. Coordination (Parkinson's) abnormal.  Skin: Skin is warm. No rash noted. He is not diaphoretic. No cyanosis or erythema. There is pallor. Nails show no clubbing.  Patient has psoriasis on multiple locations of the body. Large red patches on  the feet and lower legs. No change from previous assessment.  Psychiatric: He has a normal mood and affect. His behavior is normal. Judgment and thought content normal.  Nursing note and vitals reviewed.   Labs reviewed:  Recent Labs  07/28/16 0730 09/26/16 1435  NA 139 136  K 3.4* 4.7  CL 103 105  CO2 30 25  GLUCOSE 89 142*  BUN 14 27*  CREATININE 0.96 1.14  CALCIUM 8.9 8.7*    Recent Labs  07/28/16 0730 09/26/16 1435  AST 14* 23  ALT <5* <5*  ALKPHOS 81 63  BILITOT 1.1 0.7  PROT 6.0* 5.6*  ALBUMIN 3.7 3.4*    Recent Labs  07/28/16 0730 09/26/16 1435  WBC 7.4 5.9  NEUTROABS 5.6 4.9  HGB 12.0* 9.8*  HCT 34.5* 28.4*  MCV 101.8* 100.4*  PLT 173 186   Lab Results  Component Value Date   TSH 1.819 03/14/2016   No results found for: HGBA1C Lab Results  Component Value Date   CHOL 136 11/29/2015   HDL 44 11/29/2015   LDLCALC 80 11/29/2015   TRIG 59 11/29/2015   CHOLHDL 3.1 11/29/2015    Significant Diagnostic Results in last 30 days:  No results found.  Assessment/Plan 1. Parkinson's disease (HCC)  Continue Carbidopa-Levodopa ER 50-200- 1 tablet po Q HS  Continue Carbidopa-Levodopa 25-100 1 1/2 tablets QID  Continue Midodrine 2.5 mg tablet po TID  Continue Carbidopa-Levodopa 25-100 1-2 tablets BID PRN- excessive tremors  2. Dementia in other diseases classified elsewhere without behavioral disturbance  Stable   3. Major depressive disorder, single episode, in partial remission (HCC)  Stable  Continue Celexa 20 mg po Q HS  Continue Wellbutrin XL 150 mg po Q Day  4. Atrial fibrillation, unspecified type (HCC)  Stable  Continue Aspirin 325 mg po Q Day  5. Anemia, unspecified type  Stable   Ensure 1 can po Q Day  6. Heart valve disease  Continue Aspirin 325 mg po Q Day  7. Systolic congestive heart failure, unspecified congestive heart failure chronicity (HCC)  Stable   8. Polyneuropathy (HCC)  Stable   9. Psoriatic  arthritis (HCC)  Continue Tylenol 500 mg po BID  Continue Morphine 20 mg/ml 0.25-0.5 ml po Q 1 hour prn  10. Degeneration of intervertebral disc of lumbar region 11. Spinal stenosis, lumbar region, without neurogenic claudication  Continue Tylenol 500 mg po BID  Continue Morphine 20 mg/ml 0.25-0.5 ml po Q 1 hour prn  12. Psoriasis  Continue Triamcinolone 0.1% thin film to areas of psoriasis BID prn  Continue Neutrogena T-Gel (coal tar) [OTC] shampoo; 0.5 %; amt: enough to lather twice weekly  13. Hyperlipidemia, unspecified hyperlipidemia type  Stable   14. Impaired gait and mobility  Assist with ADLs  Use Hoyer lift for transfers from the bed to chair and to the BR  15. Anxiety  Continue Clonazepam - Schedule IV tablet; 1 mg; amt: 1 tablet PO Q HS  16. REM sleep behavior disorder  Trazodone 100 mg po Q HS  17.  Malignant Neoplasm of Prostate  Continue Alfuzosin tablet extended release 24 hr; 10 mg po Q Day  UTI Stat- 30 ml po Q day for UTI prevention  18. 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 Monthly medication orders reviewed and signed.  Vikki Ports, NP-C Geriatrics [facility] [phone], [place] [address] Cell Phone (Mon-Fri 8am-5pm):  [phone] On Call:  [phone] & follow prompts after 5pm & weekends Office Phone:  [phone] Office Fax:  [phone]

## 2017-04-29 ENCOUNTER — Encounter
Admission: RE | Admit: 2017-04-29 | Discharge: 2017-04-29 | Disposition: A | Discharge status: Home / Self Care | Payer: Medicare Other | Source: Ambulatory Visit | Attending: Internal Medicine | Admitting: Internal Medicine

## 2017-05-19 ENCOUNTER — Non-Acute Institutional Stay (SKILLED_NURSING_FACILITY): Payer: Medicare Other | Admitting: Gerontology

## 2017-05-19 DIAGNOSIS — F0280 Dementia in other diseases classified elsewhere without behavioral disturbance: Secondary | ICD-10-CM | POA: Diagnosis not present

## 2017-05-19 DIAGNOSIS — G629 Polyneuropathy, unspecified: Secondary | ICD-10-CM

## 2017-05-19 DIAGNOSIS — M48061 Spinal stenosis, lumbar region without neurogenic claudication: Secondary | ICD-10-CM

## 2017-05-19 DIAGNOSIS — L4050 Arthropathic psoriasis, unspecified: Secondary | ICD-10-CM | POA: Diagnosis not present

## 2017-05-19 DIAGNOSIS — G20 Parkinson's disease: Secondary | ICD-10-CM | POA: Diagnosis not present

## 2017-05-19 DIAGNOSIS — D649 Anemia, unspecified: Secondary | ICD-10-CM | POA: Diagnosis not present

## 2017-05-19 DIAGNOSIS — D693 Immune thrombocytopenic purpura: Secondary | ICD-10-CM

## 2017-05-19 DIAGNOSIS — L409 Psoriasis, unspecified: Secondary | ICD-10-CM | POA: Diagnosis not present

## 2017-05-19 DIAGNOSIS — M5136 Other intervertebral disc degeneration, lumbar region: Secondary | ICD-10-CM

## 2017-05-19 DIAGNOSIS — I4891 Unspecified atrial fibrillation: Secondary | ICD-10-CM | POA: Diagnosis not present

## 2017-05-19 DIAGNOSIS — G4752 REM sleep behavior disorder: Secondary | ICD-10-CM

## 2017-05-19 DIAGNOSIS — I5020 Unspecified systolic (congestive) heart failure: Secondary | ICD-10-CM

## 2017-05-19 DIAGNOSIS — F324 Major depressive disorder, single episode, in partial remission: Secondary | ICD-10-CM

## 2017-05-19 DIAGNOSIS — K5904 Chronic idiopathic constipation: Secondary | ICD-10-CM

## 2017-05-19 DIAGNOSIS — C61 Malignant neoplasm of prostate: Secondary | ICD-10-CM

## 2017-05-19 DIAGNOSIS — E785 Hyperlipidemia, unspecified: Secondary | ICD-10-CM

## 2017-05-19 DIAGNOSIS — I38 Endocarditis, valve unspecified: Secondary | ICD-10-CM

## 2017-05-19 DIAGNOSIS — R2689 Other abnormalities of gait and mobility: Secondary | ICD-10-CM

## 2017-05-19 DIAGNOSIS — F419 Anxiety disorder, unspecified: Secondary | ICD-10-CM

## 2017-05-19 NOTE — Progress Notes (Signed)
Location:      Place of Service:  SNF (31) Provider:  Toni Arthurs, NP-C  Sparks, Leonie Douglas, MD  Patient Care Team: Idelle Crouch, MD as PCP - General (Internal Medicine)  Extended Emergency Contact Information Primary Emergency Contact: Derl Barrow (Daughter)  [place] Phone: [phone] Relation: None  Code Status:  dnr Goals of care: Advanced Directive information Advanced Directives 07/28/2016  Does Patient Have a Medical Advance Directive? Yes  Type of Advance Directive Out of facility DNR (pink MOST or yellow form)  Does patient want to make changes to medical advance directive? No - Patient declined  Copy of [place] in Chart? Yes  Pre-existing out of facility DNR order (yellow form or pink MOST form) Yellow form placed in chart (order not valid for inpatient use)     Chief Complaint  Patient presents with  . Medical Management of Chronic Issues    HPI:  Pt is a 83 y.o. male seen today for medical management of chronic diseases. Pt has been stable over the past month with the chronic diseases. Pt has not had any falls this month. His tremors wax and wane. He has a Actuary that is with him the majority of the day and helps him with all his needs.  Otherwise, pt reports he is feeling well. He continues on Hospice services. 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 Pt was very excited about this. Typically, he is very sweet and always smiling. Denies pain or any other complaints at this time. VSS.     Past Medical History:  Diagnosis Date  . Atrial fibrillation ([place])   . CHF (congestive heart failure) ([place])   . Parkinson's disease ([place])   . Prostate cancer ([place])   . Psoriasis   . Vertigo    No past surgical history on file.  Allergies  Allergen Reactions  . Penicillins     Allergies as of 05/19/2017      Reactions   Penicillins       Medication List       Accurate as  of 05/19/17  4:17 PM. Always use your most recent med list.          acetaminophen 325 MG tablet Commonly known as:  TYLENOL Take 650 mg by mouth every 4 (four) hours as needed.   acetaminophen 500 MG tablet Commonly known as:  TYLENOL Take 500 mg by mouth 2 (two) times daily as needed.   alfuzosin 10 MG 24 hr tablet Commonly known as:  UROXATRAL Take 10 mg by mouth daily.   alum & mag hydroxide-simeth 778-242-35 MG/5ML suspension Commonly known as:  MAALOX PLUS Take 30 mLs by mouth every 4 (four) hours as needed for indigestion.   baclofen 10 MG tablet Commonly known as:  LIORESAL Take 5 mg by mouth 3 (three) times daily.   bisacodyl 10 MG suppository Commonly known as:  DULCOLAX Place 10 mg rectally daily as needed for moderate constipation.   Buffered Aspirin 325 MG Tabs Take 1 tablet by mouth daily.   buPROPion 150 MG 24 hr tablet Commonly known as:  WELLBUTRIN XL Take 150 mg by mouth daily.   carbidopa-levodopa 50-200 MG tablet Commonly known as:  SINEMET CR Take 1 tablet by mouth at bedtime.   carbidopa-levodopa 25-100 MG tablet Commonly known as:  SINEMET IR Take 1-2 tablets by mouth 2 (two) times daily as needed.   carbidopa-levodopa 25-100 MG  tablet Commonly known as:  SINEMET IR Take 1.5 tablets by mouth 4 (four) times daily.   cholecalciferol 1000 units tablet Commonly known as:  VITAMIN D Take 1,000 Units by mouth daily.   citalopram 20 MG tablet Commonly known as:  CELEXA Take 20 mg by mouth at bedtime.   clonazePAM 1 MG tablet Commonly known as:  KLONOPIN Take 1 mg by mouth every evening.   donepezil 5 MG tablet Commonly known as:  ARICEPT Take 5 mg by mouth daily.   fludrocortisone 0.1 MG tablet Commonly known as:  FLORINEF Take 0.1 mg by mouth daily.   folic acid 1 MG tablet Commonly known as:  FOLVITE Take 1 mg by mouth daily.   hydrocortisone cream 1 % Apply 1 application topically 4 (four) times daily as needed for itching.  Apply thin film to clean, dry hemorrhoids   LORazepam 0.5 MG tablet Commonly known as:  ATIVAN Take 0.5 mg by mouth every 4 (four) hours as needed for anxiety.   magnesium hydroxide 400 MG/5ML suspension Commonly known as:  MILK OF MAGNESIA Take 30 mLs by mouth every 4 (four) hours as needed for mild constipation.   memantine 10 MG tablet Commonly known as:  NAMENDA Take 10 mg by mouth 2 (two) times daily.   morphine 20 MG/ML concentrated solution Commonly known as:  ROXANOL Take 5-10 mg by mouth every hour as needed for severe pain.   potassium chloride 10 MEQ CR capsule Commonly known as:  MICRO-K Take 10 mEq by mouth daily.   predniSONE 5 MG tablet Commonly known as:  DELTASONE Take 5 mg by mouth daily.   sorbitol 70 % solution Take 30 mLs by mouth 2 (two) times daily as needed.   traZODone 100 MG tablet Commonly known as:  DESYREL Take 100 mg by mouth at bedtime as needed for sleep.   vitamin B-12 1000 MCG tablet Commonly known as:  CYANOCOBALAMIN Take 1,000 mcg by mouth daily.       Review of Systems  Constitutional: Negative for activity change, appetite change, chills, diaphoresis, fatigue and fever.  HENT: Negative for congestion, dental problem, drooling, ear discharge, ear pain, facial swelling, mouth sores, postnasal drip, sinus pain, sore throat and trouble swallowing.   Eyes: Negative.   Respiratory: Negative for cough, chest tightness and shortness of breath.   Cardiovascular: Negative for chest pain, palpitations and leg swelling.  Gastrointestinal: Negative for diarrhea, nausea and vomiting.  Genitourinary: Negative.   Musculoskeletal: Positive for arthralgias and gait problem.  Skin: Positive for rash (psoriasis).  Neurological: Positive for tremors, speech difficulty and weakness.  Psychiatric/Behavioral: Negative.   All other systems reviewed and are negative.    There is no immunization history on file for this patient. Pertinent  Health  Maintenance Due  Topic Date Due  . PNA vac Low Risk Adult (1 of 2 - PCV13) 05/28/1997  . INFLUENZA VACCINE  07/30/2017   No flowsheet data found. Functional Status Survey:    Vitals:   05/05/17 0900  BP: 112/62  Pulse: (!) 51  Resp: 16  Temp: 97.9 F (36.6 C)  SpO2: 99%  Weight: 151 lb 3.2 oz (68.6 kg)   Body mass index is 22.99 kg/m. Physical Exam  Constitutional: He is oriented to person, place, and time. He appears well-developed and well-nourished. No distress.  HENT:  Head: Normocephalic and atraumatic.  Eyes: Conjunctivae and EOM are normal. Pupils are equal, round, and reactive to light.  Neck: Normal range of motion. Neck  supple. No JVD present. No thyroid mass present.  Cardiovascular: Normal rate, regular rhythm, normal heart sounds and intact distal pulses.  Exam reveals no gallop and no friction rub.   No murmur heard. Pulmonary/Chest: Effort normal and breath sounds normal. No accessory muscle usage. No respiratory distress. He has no decreased breath sounds. He has no wheezes. He has no rhonchi. He has no rales. He exhibits no tenderness.  Abdominal: Soft. He exhibits no distension and no pulsatile midline mass. Bowel sounds are increased. There is no tenderness.  Abdomen firm, but non-tender  Musculoskeletal: He exhibits no edema or tenderness.  Neurological: He is alert and oriented to person, place, and time. He exhibits abnormal muscle tone. Coordination (Parkinson's) abnormal.  Skin: Skin is warm. No rash noted. He is not diaphoretic. No cyanosis or erythema. There is pallor. Nails show no clubbing.  Patient has psoriasis on multiple locations of the body. Large red patches on the feet and lower legs. No change from previous assessment.  Psychiatric: He has a normal mood and affect. His behavior is normal. Judgment and thought content normal.  Nursing note and vitals reviewed.   Labs reviewed:  Recent Labs  07/28/16 0730 09/26/16 1435  NA 139 136  K  3.4* 4.7  CL 103 105  CO2 30 25  GLUCOSE 89 142*  BUN 14 27*  CREATININE 0.96 1.14  CALCIUM 8.9 8.7*    Recent Labs  07/28/16 0730 09/26/16 1435  AST 14* 23  ALT <5* <5*  ALKPHOS 81 63  BILITOT 1.1 0.7  PROT 6.0* 5.6*  ALBUMIN 3.7 3.4*    Recent Labs  07/28/16 0730 09/26/16 1435  WBC 7.4 5.9  NEUTROABS 5.6 4.9  HGB 12.0* 9.8*  HCT 34.5* 28.4*  MCV 101.8* 100.4*  PLT 173 186   Lab Results  Component Value Date   TSH 1.819 03/14/2016   No results found for: HGBA1C Lab Results  Component Value Date   CHOL 136 11/29/2015   HDL 44 11/29/2015   LDLCALC 80 11/29/2015   TRIG 59 11/29/2015   CHOLHDL 3.1 11/29/2015    Significant Diagnostic Results in last 30 days:  No results found.  Assessment/Plan 1. Parkinson's disease (HCC)  Continue Carbidopa-Levodopa ER 50-200- 1 tablet po Q HS  Continue Carbidopa-Levodopa 25-100 1 1/2 tablets QID  Continue Midodrine 2.5 mg tablet po TID  Continue Carbidopa-Levodopa 25-100 1-2 tablets BID PRN- excessive tremors  2. Dementia in other diseases classified elsewhere without behavioral disturbance  Stable, slow progression   3. Major depressive disorder, single episode, in partial remission (HCC)  Stable  Continue Celexa 20 mg po Q HS  Continue Wellbutrin XL 150 mg po Q Day  4. Atrial fibrillation, unspecified type (HCC)  Stable  Continue Aspirin 325 mg po Q Day  5. Anemia, unspecified type  Stable   Ensure 1 can po Q Day  6. Heart valve disease  Continue Aspirin 325 mg po Q Day  7. Systolic congestive heart failure, unspecified congestive heart failure chronicity (HCC)  Stable   8. Polyneuropathy (HCC)  Stable   9. Psoriatic arthritis (HCC)  Continue Tylenol 500 mg po BID  Continue Morphine 20 mg/ml 0.25-0.5 ml po Q 1 hour prn  10. Degeneration of intervertebral disc of lumbar region 11. Spinal stenosis, lumbar region, without neurogenic claudication  Continue Tylenol 500 mg po  BID  Continue Morphine 20 mg/ml 0.25-0.5 ml po Q 1 hour prn  12. Psoriasis  Continue Triamcinolone 0.1% thin film to areas  of psoriasis BID prn  Continue Neutrogena T-Gel (coal tar) [OTC] shampoo; 0.5 %; amt: enough to lather twice weekly  13. Hyperlipidemia, unspecified hyperlipidemia type  Stable   14. Impaired gait and mobility  Assist with ADLs  Use Hoyer lift for transfers from the bed to chair and to the BR  15. Anxiety  Continue Clonazepam - Schedule IV tablet; 1 mg; amt: 1 tablet PO Q HS  16. REM sleep behavior disorder  Trazodone 100 mg po Q HS  17.  Malignant Neoplasm of Prostate  Continue Alfuzosin tablet extended release 24 hr; 10 mg po Q Day  UTI Stat- 30 ml po Q day for UTI prevention  18. Chronic idiopathic constipation  Continue Sorbitol 70%- 45 mL po TID   Continue Bisacodyl 10 mg PR Q Day prn  19. Idiopathic thrombocytopenia Purpura  Continue Prednisone 5 mg PO Q Day   Family/ staff Communication:   Total Time:  Documentation:  Face to Face:  Family/Phone:   Labs/tests ordered:    Medication list reviewed and assessed for continued appropriateness. Monthly medication orders reviewed and signed.  Vikki Ports, NP-C Geriatrics [facility] [phone], [place] [address] Cell Phone (Mon-Fri 8am-5pm):  [phone] On Call:  [phone] & follow prompts after 5pm & weekends Office Phone:  [phone] Office Fax:  [phone]

## 2017-05-30 ENCOUNTER — Encounter
Admission: RE | Admit: 2017-05-30 | Discharge: 2017-05-30 | Disposition: A | Discharge status: Home / Self Care | Payer: Medicare Other | Source: Ambulatory Visit | Attending: Internal Medicine | Admitting: Internal Medicine

## 2017-06-05 ENCOUNTER — Other Ambulatory Visit
Admission: RE | Admit: 2017-06-05 | Discharge: 2017-06-05 | Disposition: A | Discharge status: Home / Self Care | Source: Ambulatory Visit | Attending: Gerontology | Admitting: Gerontology

## 2017-06-05 DIAGNOSIS — G20 Parkinson's disease: Secondary | ICD-10-CM | POA: Insufficient documentation

## 2017-06-05 LAB — CBC WITH DIFFERENTIAL/PLATELET
BASOS ABS: 0 10*3/uL (ref 0–0.1)
Basophils Relative: 0 %
Eosinophils Absolute: 0.3 10*3/uL (ref 0–0.7)
Eosinophils Relative: 5 %
HEMATOCRIT: 32.8 % — AB (ref 40.0–52.0)
Hemoglobin: 11.2 g/dL — ABNORMAL LOW (ref 13.0–18.0)
LYMPHS ABS: 1.3 10*3/uL (ref 1.0–3.6)
LYMPHS PCT: 21 %
MCH: 33.3 pg (ref 26.0–34.0)
MCHC: 34.2 g/dL (ref 32.0–36.0)
MCV: 97.4 fL (ref 80.0–100.0)
MONO ABS: 0.7 10*3/uL (ref 0.2–1.0)
Monocytes Relative: 11 %
NEUTROS ABS: 3.9 10*3/uL (ref 1.4–6.5)
Neutrophils Relative %: 63 %
Platelets: 179 10*3/uL (ref 150–440)
RBC: 3.37 MIL/uL — AB (ref 4.40–5.90)
RDW: 13.2 % (ref 11.5–14.5)
WBC: 6.3 10*3/uL (ref 3.8–10.6)

## 2017-06-05 LAB — COMPREHENSIVE METABOLIC PANEL
AST: 17 U/L (ref 15–41)
Albumin: 3.3 g/dL — ABNORMAL LOW (ref 3.5–5.0)
Alkaline Phosphatase: 78 U/L (ref 38–126)
Anion gap: 6 (ref 5–15)
BILIRUBIN TOTAL: 0.6 mg/dL (ref 0.3–1.2)
BUN: 20 mg/dL (ref 6–20)
CO2: 27 mmol/L (ref 22–32)
CREATININE: 0.88 mg/dL (ref 0.61–1.24)
Calcium: 8.7 mg/dL — ABNORMAL LOW (ref 8.9–10.3)
Chloride: 104 mmol/L (ref 101–111)
Glucose, Bld: 88 mg/dL (ref 65–99)
Potassium: 4 mmol/L (ref 3.5–5.1)
Sodium: 137 mmol/L (ref 135–145)
Total Protein: 6 g/dL — ABNORMAL LOW (ref 6.5–8.1)

## 2017-06-05 LAB — TSH: TSH: 2.83 u[IU]/mL (ref 0.350–4.500)

## 2017-06-05 LAB — VITAMIN B12: Vitamin B-12: 410 pg/mL (ref 180–914)

## 2017-06-06 LAB — VITAMIN D 25 HYDROXY (VIT D DEFICIENCY, FRACTURES): VIT D 25 HYDROXY: 25.6 ng/mL — AB (ref 30.0–100.0)

## 2017-06-09 ENCOUNTER — Non-Acute Institutional Stay (SKILLED_NURSING_FACILITY): Payer: Medicare Other | Admitting: Gerontology

## 2017-06-09 DIAGNOSIS — K2900 Acute gastritis without bleeding: Secondary | ICD-10-CM | POA: Diagnosis not present

## 2017-06-09 DIAGNOSIS — K1120 Sialoadenitis, unspecified: Secondary | ICD-10-CM

## 2017-06-29 ENCOUNTER — Encounter
Admission: RE | Admit: 2017-06-29 | Discharge: 2017-06-29 | Disposition: A | Discharge status: Home / Self Care | Payer: Medicare Other | Source: Ambulatory Visit | Attending: Internal Medicine | Admitting: Internal Medicine

## 2017-06-29 NOTE — Progress Notes (Signed)
Location:      Place of Service:  SNF (31) Provider:  Toni Arthurs, NP-C  Sparks, Leonie Douglas, MD  Patient Care Team: Idelle Crouch, MD as PCP - General (Internal Medicine)  Extended Emergency Contact Information Primary Emergency Contact: Derl Barrow (Daughter)  [place] Phone: [phone] Relation: None  Code Status:  dnr Goals of care: Advanced Directive information Advanced Directives 07/28/2016  Does Patient Have a Medical Advance Directive? Yes  Type of Advance Directive Out of facility DNR (pink MOST or yellow form)  Does patient want to make changes to medical advance directive? No - Patient declined  Copy of [place] in Chart? Yes  Pre-existing out of facility DNR order (yellow form or pink MOST form) Yellow form placed in chart (order not valid for inpatient use)     Chief Complaint  Patient presents with  . Acute Visit    HPI:  Pt is a 83 y.o. male seen today for an acute visit for swelling of the left jaw. Pt reports painful swelling began yesterday afternoon. Area is warm, tender to touch. Located in the left, submandibular jaw. No drainage. No loose dentition. No obvious foreign bodies. Negative post-auricular pain, swelling. No ear pain. Pt is afebrile. Pt also c/o sharp abdominal pain/ pressure with extra gas. Pt started on antibiotic yesterday for the parotiditis as reported by the nurse. Likely gastritis r/t antibiotic use.  No other complaints. VSS.    Past Medical History:  Diagnosis Date  . Atrial fibrillation ([place])   . CHF (congestive heart failure) ([place])   . Parkinson's disease ([place])   . Prostate cancer ([place])   . Psoriasis   . Vertigo    No past surgical history on file.  Allergies  Allergen Reactions  . Penicillins     Allergies as of 06/09/2017      Reactions   Penicillins       Medication List       Accurate as of 06/09/17 11:59 PM. Always use your most recent med list.            acetaminophen 325 MG tablet Commonly known as:  TYLENOL Take 650 mg by mouth every 4 (four) hours as needed.   acetaminophen 500 MG tablet Commonly known as:  TYLENOL Take 500 mg by mouth 2 (two) times daily as needed.   alfuzosin 10 MG 24 hr tablet Commonly known as:  UROXATRAL Take 10 mg by mouth daily.   alum & mag hydroxide-simeth 185-631-49 MG/5ML suspension Commonly known as:  MAALOX PLUS Take 30 mLs by mouth every 4 (four) hours as needed for indigestion.   baclofen 10 MG tablet Commonly known as:  LIORESAL Take 5 mg by mouth 3 (three) times daily.   bisacodyl 10 MG suppository Commonly known as:  DULCOLAX Place 10 mg rectally daily as needed for moderate constipation.   Buffered Aspirin 325 MG Tabs Take 1 tablet by mouth daily.   buPROPion 150 MG 24 hr tablet Commonly known as:  WELLBUTRIN XL Take 150 mg by mouth daily.   carbidopa-levodopa 50-200 MG tablet Commonly known as:  SINEMET CR Take 1 tablet by mouth at bedtime.   carbidopa-levodopa 25-100 MG tablet Commonly known as:  SINEMET IR Take 1-2 tablets by mouth 2 (two) times daily as needed.   carbidopa-levodopa 25-100 MG tablet Commonly known as:  SINEMET IR Take 1.5 tablets by mouth 4 (four) times daily.   cholecalciferol 1000 units tablet Commonly known as:  VITAMIN D Take 1,000 Units by mouth daily.   citalopram 20 MG tablet Commonly known as:  CELEXA Take 20 mg by mouth at bedtime.   clonazePAM 1 MG tablet Commonly known as:  KLONOPIN Take 1 mg by mouth every evening.   donepezil 5 MG tablet Commonly known as:  ARICEPT Take 5 mg by mouth daily.   fludrocortisone 0.1 MG tablet Commonly known as:  FLORINEF Take 0.1 mg by mouth daily.   folic acid 1 MG tablet Commonly known as:  FOLVITE Take 1 mg by mouth daily.   hydrocortisone cream 1 % Apply 1 application topically 4 (four) times daily as needed for itching. Apply thin film to clean, dry hemorrhoids   LORazepam 0.5 MG  tablet Commonly known as:  ATIVAN Take 0.5 mg by mouth every 4 (four) hours as needed for anxiety.   magnesium hydroxide 400 MG/5ML suspension Commonly known as:  MILK OF MAGNESIA Take 30 mLs by mouth every 4 (four) hours as needed for mild constipation.   memantine 10 MG tablet Commonly known as:  NAMENDA Take 10 mg by mouth 2 (two) times daily.   morphine 20 MG/ML concentrated solution Commonly known as:  ROXANOL Take 5-10 mg by mouth every hour as needed for severe pain.   potassium chloride 10 MEQ CR capsule Commonly known as:  MICRO-K Take 10 mEq by mouth daily.   predniSONE 5 MG tablet Commonly known as:  DELTASONE Take 5 mg by mouth daily.   sorbitol 70 % solution Take 30 mLs by mouth 2 (two) times daily as needed.   traZODone 100 MG tablet Commonly known as:  DESYREL Take 100 mg by mouth at bedtime as needed for sleep.   vitamin B-12 1000 MCG tablet Commonly known as:  CYANOCOBALAMIN Take 1,000 mcg by mouth daily.       Review of Systems  Constitutional: Positive for fatigue. Negative for activity change, appetite change, chills, diaphoresis and fever.  HENT: Positive for facial swelling. Negative for congestion, dental problem, drooling, ear discharge, ear pain, mouth sores, postnasal drip, sinus pain, sinus pressure, sore throat, tinnitus and trouble swallowing.   Eyes: Negative.   Respiratory: Negative for cough, chest tightness and shortness of breath.   Cardiovascular: Negative for chest pain, palpitations and leg swelling.  Gastrointestinal: Positive for abdominal pain. Negative for diarrhea and nausea.  Genitourinary: Negative.   Musculoskeletal: Positive for arthralgias and gait problem.  Neurological: Positive for tremors, speech difficulty and weakness.  Psychiatric/Behavioral: Negative.   All other systems reviewed and are negative.    There is no immunization history on file for this patient. Pertinent  Health Maintenance Due  Topic Date Due   . PNA vac Low Risk Adult (1 of 2 - PCV13) 05/28/1997  . INFLUENZA VACCINE  07/30/2017   No flowsheet data found. Functional Status Survey:    Vitals:   05/05/17 0900  BP: 112/62  Pulse: (!) 51  Resp: 16  Temp: 97.9 F (36.6 C)  SpO2: 99%   There is no height or weight on file to calculate BMI. Physical Exam  Constitutional: He is oriented to person, place, and time. He appears well-developed and well-nourished. No distress.  HENT:  Head: Normocephalic and atraumatic.  Eyes: Conjunctivae and EOM are normal. Pupils are equal, round, and reactive to light.  Neck: Normal range of motion. Neck supple. No JVD present. Erythema present. No thyroid mass present.    Left Parotitis  Cardiovascular: Normal rate, regular rhythm, normal heart sounds and  intact distal pulses.  Exam reveals no gallop and no friction rub.   No murmur heard. Pulses:      Dorsalis pedis pulses are 2+ on the right side, and 2+ on the left side.  Pulmonary/Chest: Effort normal and breath sounds normal. No respiratory distress. He has no decreased breath sounds. He has no wheezes. He has no rhonchi. He has no rales. He exhibits no tenderness.  Abdominal: Soft. He exhibits no distension and no pulsatile midline mass. Bowel sounds are increased. There is generalized tenderness. There is no rebound and no CVA tenderness.  Abdomen firm, but non-tender  Musculoskeletal: He exhibits no edema or tenderness.  Neurological: He is alert and oriented to person, place, and time. He exhibits abnormal muscle tone. Coordination (Parkinson's) abnormal.  Skin: Skin is warm. No rash noted. He is not diaphoretic. No cyanosis or erythema. There is pallor. Nails show no clubbing.  Patient has psoriasis on multiple locations of the body. Large red patches on the feet and lower legs. No change from previous assessment.  Psychiatric: He has a normal mood and affect. His behavior is normal. Judgment and thought content normal.  Nursing  note and vitals reviewed.   Labs reviewed:  Recent Labs  07/28/16 0730 09/26/16 1435 06/05/17 0500  NA 139 136 137  K 3.4* 4.7 4.0  CL 103 105 104  CO2 30 25 27   GLUCOSE 89 142* 88  BUN 14 27* 20  CREATININE 0.96 1.14 0.88  CALCIUM 8.9 8.7* 8.7*    Recent Labs  07/28/16 0730 09/26/16 1435 06/05/17 0500  AST 14* 23 17  ALT <5* <5* <5*  ALKPHOS 81 63 78  BILITOT 1.1 0.7 0.6  PROT 6.0* 5.6* 6.0*  ALBUMIN 3.7 3.4* 3.3*    Recent Labs  07/28/16 0730 09/26/16 1435 06/05/17 0500  WBC 7.4 5.9 6.3  NEUTROABS 5.6 4.9 3.9  HGB 12.0* 9.8* 11.2*  HCT 34.5* 28.4* 32.8*  MCV 101.8* 100.4* 97.4  PLT 173 186 179   Lab Results  Component Value Date   TSH 2.830 06/05/2017   No results found for: HGBA1C Lab Results  Component Value Date   CHOL 136 11/29/2015   HDL 44 11/29/2015   LDLCALC 80 11/29/2015   TRIG 59 11/29/2015   CHOLHDL 3.1 11/29/2015    Significant Diagnostic Results in last 30 days:  No results found.  Assessment/Plan 1. Parotitis, acute  Clindamycin 300 mg  1 capsule po TID x 7 days  Lemon drops/ sour candy po Q 2 hours while awake- hold candy in left jaw  Warm, moist compresses with gentle massage to left jaw QID x 7 days  2. Other acute gastritis without hemorrhage  [place]isa-bid 1 tablet po BID  Maalox 30 ml po Q 4 hours prn  Encouraged pt to eat yogurt and cottage cheese, etc  Family/ staff Communication:   Total Time:  Documentation:  Face to Face:  Family/Phone:   Labs/tests ordered:    Medication list reviewed and assessed for continued appropriateness.  Vikki Ports, NP-C Geriatrics [facility] [phone], [place] [address] Cell Phone (Mon-Fri 8am-5pm):  [phone] On Call:  [phone] & follow prompts after 5pm & weekends Office Phone:  [phone] Office Fax:  [phone]

## 2017-07-10 ENCOUNTER — Non-Acute Institutional Stay (SKILLED_NURSING_FACILITY): Payer: Medicare Other | Admitting: Gerontology

## 2017-07-10 DIAGNOSIS — M5136 Other intervertebral disc degeneration, lumbar region: Secondary | ICD-10-CM | POA: Diagnosis not present

## 2017-07-10 DIAGNOSIS — F324 Major depressive disorder, single episode, in partial remission: Secondary | ICD-10-CM | POA: Diagnosis not present

## 2017-07-10 DIAGNOSIS — C61 Malignant neoplasm of prostate: Secondary | ICD-10-CM

## 2017-07-10 DIAGNOSIS — I38 Endocarditis, valve unspecified: Secondary | ICD-10-CM

## 2017-07-10 DIAGNOSIS — G4752 REM sleep behavior disorder: Secondary | ICD-10-CM

## 2017-07-10 DIAGNOSIS — L409 Psoriasis, unspecified: Secondary | ICD-10-CM

## 2017-07-10 DIAGNOSIS — G20 Parkinson's disease: Secondary | ICD-10-CM

## 2017-07-10 DIAGNOSIS — F419 Anxiety disorder, unspecified: Secondary | ICD-10-CM

## 2017-07-10 DIAGNOSIS — E785 Hyperlipidemia, unspecified: Secondary | ICD-10-CM

## 2017-07-10 DIAGNOSIS — I4891 Unspecified atrial fibrillation: Secondary | ICD-10-CM

## 2017-07-10 DIAGNOSIS — D649 Anemia, unspecified: Secondary | ICD-10-CM | POA: Diagnosis not present

## 2017-07-10 DIAGNOSIS — D693 Immune thrombocytopenic purpura: Secondary | ICD-10-CM

## 2017-07-10 DIAGNOSIS — M48061 Spinal stenosis, lumbar region without neurogenic claudication: Secondary | ICD-10-CM | POA: Diagnosis not present

## 2017-07-10 DIAGNOSIS — G629 Polyneuropathy, unspecified: Secondary | ICD-10-CM | POA: Diagnosis not present

## 2017-07-10 DIAGNOSIS — L4050 Arthropathic psoriasis, unspecified: Secondary | ICD-10-CM | POA: Diagnosis not present

## 2017-07-10 DIAGNOSIS — K5904 Chronic idiopathic constipation: Secondary | ICD-10-CM

## 2017-07-10 DIAGNOSIS — F0280 Dementia in other diseases classified elsewhere without behavioral disturbance: Secondary | ICD-10-CM | POA: Diagnosis not present

## 2017-07-10 DIAGNOSIS — I5020 Unspecified systolic (congestive) heart failure: Secondary | ICD-10-CM

## 2017-07-10 DIAGNOSIS — R2689 Other abnormalities of gait and mobility: Secondary | ICD-10-CM

## 2017-07-11 ENCOUNTER — Encounter: Payer: Self-pay | Admitting: Gerontology

## 2017-07-11 NOTE — Progress Notes (Signed)
Location:   [facility] Room Number: 779T Place of Service:  SNF [phone]) Provider:  Toni Arthurs, NP-C  Sparks, Leonie Douglas, MD  Patient Care Team: Idelle Crouch, MD as PCP - General (Internal Medicine)  Extended Emergency Contact Information Primary Emergency Contact: Derl Barrow (Daughter)  [place] Phone: [phone] Relation: None  Code Status:  DNR Goals of care: Advanced Directive information Advanced Directives 07/11/2017  Does Patient Have a Medical Advance Directive? Yes  Type of Advance Directive Out of facility DNR (pink MOST or yellow form)  Does patient want to make changes to medical advance directive? No - Patient declined  Copy of [place] in Chart? -  Pre-existing out of facility DNR order (yellow form or pink MOST form) -     Chief Complaint  Patient presents with  . Medical Management of Chronic Issues    Routine Visit    HPI:  Pt is a 83 y.o. male seen today for medical management of chronic diseases. Pt has been stable over the past month with the chronic diseases. Pt has not had any falls this month. His tremors wax and wane. He has a Actuary that is with him the majority of the day and helps him with all his needs.  Otherwise, pt reports he is feeling well. He continues on Hospice services. 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 Pt was very excited about this. Typically, he is very sweet and always smiling. Pt does have ongoing issues with intermittent constipation. Denies pain or any other complaints at this time. VSS.     Past Medical History:  Diagnosis Date  . Anemia, unspecified    pernicious  . Anxiety    unspecified  . Arthritis   . Atrial fibrillation ([place])   . CHF (congestive heart failure) ([place]) 01/2006  . Kidney stones   . Nephrolithiasis   . Parkinson's disease ([place])   . Peripheral neuropathy   . Prostate cancer ([place])  11/2006  . Psoriasis   . Psoriasis, unspecified   . Skin cancer   . Sleep disturbance    REM  . Spinal stenosis    s/p lumbar laminectomy  . Unspecified atrial fibrillation ([place])   . Upper GI bleed    unspecified; history of AVMs  . Valvular heart disease   . Vertigo    Past Surgical History:  Procedure Laterality Date  . CATARACT EXTRACTION    . EYE SURGERY    . INTERAL BLEEDING  10/2011   stomach  . MOHS SURGERY     Facial skin   . PROSTATE SURGERY    . SPINE SURGERY      Allergies  Allergen Reactions  . Other     Strawberries  . Penicillins     Allergies as of 07/10/2017      Reactions   Penicillins       Medication List       Accurate as of 07/10/17 11:59 PM. Always use your most recent med list.          acetaminophen 325 MG tablet Commonly known as:  TYLENOL Take 650 mg by mouth every 4 (four) hours as needed.   acetaminophen 500 MG tablet Commonly known as:  TYLENOL Take 500 mg by mouth 2 (two) times daily. Maximum of 3000 mg of acetaminophen in 24 hours. Check prn acetaminophen order prior to administration due to limit.  alfuzosin 10 MG 24 hr tablet Commonly known as:  UROXATRAL Take 10 mg by mouth daily.   bisacodyl 10 MG suppository Commonly known as:  DULCOLAX Place 10 mg rectally daily as needed for moderate constipation.   buPROPion 150 MG 24 hr tablet Commonly known as:  WELLBUTRIN XL Take 150 mg by mouth daily.   carbidopa-levodopa 50-200 MG tablet Commonly known as:  SINEMET CR Take 1 tablet by mouth 2 (two) times daily.   carbidopa-levodopa 25-100 MG tablet Commonly known as:  SINEMET IR Take 1-2 tablets by mouth 2 (two) times daily as needed. Administer 1 tablet if no effect/decreased tremors in 1 hour, then may administer 2nd tablet   carbidopa-levodopa 25-100 MG tablet Commonly known as:  SINEMET IR Take 2 tablets by mouth 2 (two) times daily.   citalopram 20 MG tablet Commonly known as:  CELEXA Take 20 mg by mouth at  bedtime.   clonazePAM 1 MG tablet Commonly known as:  KLONOPIN Take 1 mg by mouth at bedtime.   coal tar 0.5 % shampoo Commonly known as:  NEUTROGENA T-GEL Apply 1 application topically 2 (two) times a week. Enough to lather   DERMACLOUD Crea Apply liberal mount topically to area of skin irritation 2 times daily as needed. Ok to leave at bedside   magnesium hydroxide 400 MG/5ML suspension Commonly known as:  MILK OF MAGNESIA Take 30 mLs by mouth every 4 (four) hours as needed for mild constipation.   midodrine 2.5 MG tablet Commonly known as:  PROAMATINE Take 2.5 mg by mouth 3 (three) times daily with meals. Take along with sinemet   morphine 20 MG/ML concentrated solution Commonly known as:  ROXANOL Take 5-10 mg by mouth every hour as needed for severe pain.   NUTRITIONAL SUPPLEMENTS PO Give Magic Cup by mouth 2 times daily with lunch and supper tray.   ENSURE Take 1 Can by mouth daily.   Phenol 0.5 % Liqd Use as directed 2 sprays in the mouth or throat every 2 (two) hours as needed.   predniSONE 5 MG tablet Commonly known as:  DELTASONE Take 5 mg by mouth daily.   RISA-BID PROBIOTIC Tabs Take 1 tablet by mouth 2 (two) times daily.   shark liver oil-cocoa butter 0.25-3-85.5 % suppository Commonly known as:  PREPARATION H Place 1 suppository rectally at bedtime.   sorbitol 70 % solution Take 45 mLs by mouth 3 (three) times daily.   traZODone 100 MG tablet Commonly known as:  DESYREL Take 100 mg by mouth at bedtime as needed for sleep.   triamcinolone cream 0.1 % Commonly known as:  KENALOG Apply 1 application topically 2 (two) times daily. Apply to psoriatic rash   UTI-STAT Liqd Take 30 mLs by mouth daily.       Review of Systems  Constitutional: Negative for activity change, appetite change, chills, diaphoresis, fatigue and fever.  HENT: Negative for congestion, dental problem, drooling, ear discharge, ear pain, facial swelling, mouth sores, postnasal  drip, sinus pain, sore throat and trouble swallowing.   Eyes: Negative.   Respiratory: Negative for cough, chest tightness and shortness of breath.   Cardiovascular: Negative for chest pain, palpitations and leg swelling.  Gastrointestinal: Negative for diarrhea, nausea and vomiting.  Genitourinary: Negative.   Musculoskeletal: Positive for arthralgias and gait problem.  Skin: Positive for rash (psoriasis).  Neurological: Positive for tremors, speech difficulty and weakness.  Psychiatric/Behavioral: Negative.   All other systems reviewed and are negative.   Immunization History  Administered  Date(s) Administered  . Influenza-Unspecified 10/09/2012, 09/28/2016  . PPD Test 10/12/2016  . Pneumococcal-Unspecified 05/13/2014   Pertinent  Health Maintenance Due  Topic Date Due  . PNA vac Low Risk Adult (1 of 2 - PCV13) 05/28/1997  . INFLUENZA VACCINE  07/30/2017   No flowsheet data found. Functional Status Survey:    Vitals:   07/10/17 1446  BP: 93/60  Pulse: 69  Resp: 18  Temp: 97.7 F (36.5 C)  SpO2: 94%  Weight: 157 lb (71.2 kg)  Height: 5\' 8"  (1.727 m)   Body mass index is 23.87 kg/m. Physical Exam  Constitutional: He is oriented to person, place, and time. He appears well-developed and well-nourished. No distress.  HENT:  Head: Normocephalic and atraumatic.  Eyes: Pupils are equal, round, and reactive to light. Conjunctivae and EOM are normal.  Neck: Normal range of motion. Neck supple. No JVD present. No thyroid mass present.  Cardiovascular: Normal rate, regular rhythm, normal heart sounds and intact distal pulses.  Exam reveals no gallop and no friction rub.   No murmur heard. Pulmonary/Chest: Effort normal and breath sounds normal. No accessory muscle usage. No respiratory distress. He has no decreased breath sounds. He has no wheezes. He has no rhonchi. He has no rales. He exhibits no tenderness.  Abdominal: Soft. He exhibits no distension and no pulsatile  midline mass. Bowel sounds are increased. There is no tenderness.  Abdomen firm, but non-tender  Musculoskeletal: He exhibits no edema or tenderness.  Neurological: He is alert and oriented to person, place, and time. He exhibits abnormal muscle tone. Coordination (Parkinson's) abnormal.  Skin: Skin is warm. No rash noted. He is not diaphoretic. No cyanosis or erythema. There is pallor. Nails show no clubbing.  Patient has psoriasis on multiple locations of the body. Large red patches on the feet and lower legs. No change from previous assessment.  Psychiatric: He has a normal mood and affect. His behavior is normal. Judgment and thought content normal.  Nursing note and vitals reviewed.   Labs reviewed:  Recent Labs  07/28/16 0730 09/26/16 1435 06/05/17 0500  NA 139 136 137  K 3.4* 4.7 4.0  CL 103 105 104  CO2 30 25 27   GLUCOSE 89 142* 88  BUN 14 27* 20  CREATININE 0.96 1.14 0.88  CALCIUM 8.9 8.7* 8.7*    Recent Labs  07/28/16 0730 09/26/16 1435 06/05/17 0500  AST 14* 23 17  ALT <5* <5* <5*  ALKPHOS 81 63 78  BILITOT 1.1 0.7 0.6  PROT 6.0* 5.6* 6.0*  ALBUMIN 3.7 3.4* 3.3*    Recent Labs  07/28/16 0730 09/26/16 1435 06/05/17 0500  WBC 7.4 5.9 6.3  NEUTROABS 5.6 4.9 3.9  HGB 12.0* 9.8* 11.2*  HCT 34.5* 28.4* 32.8*  MCV 101.8* 100.4* 97.4  PLT 173 186 179   Lab Results  Component Value Date   TSH 2.830 06/05/2017   No results found for: HGBA1C Lab Results  Component Value Date   CHOL 136 11/29/2015   HDL 44 11/29/2015   LDLCALC 80 11/29/2015   TRIG 59 11/29/2015   CHOLHDL 3.1 11/29/2015    Significant Diagnostic Results in last 30 days:  No results found.  Assessment/Plan 1. Parkinson's disease (HCC)  Continue Carbidopa-Levodopa ER 50-200- 1 tablet po Q HS  Continue Carbidopa-Levodopa 25-100 1 1/2 tablets QID  Continue Midodrine 2.5 mg tablet po TID  Continue Carbidopa-Levodopa 25-100 1-2 tablets BID PRN- excessive tremors  2. Dementia in  other diseases classified elsewhere without  behavioral disturbance  Stable, slow progression   3. Major depressive disorder, single episode, in partial remission (HCC)  Stable  Continue Celexa 20 mg po Q HS  Continue Wellbutrin XL 150 mg po Q Day  4. Atrial fibrillation, unspecified type (HCC)  Stable  Continue Aspirin 325 mg po Q Day  5. Anemia, unspecified type  Stable   Ensure 1 can po Q Day  6. Heart valve disease  Continue Aspirin 325 mg po Q Day  7. Systolic congestive heart failure, unspecified congestive heart failure chronicity (HCC)  Stable   8. Polyneuropathy (HCC)  Stable   9. Psoriatic arthritis (HCC)  Continue Tylenol 500 mg po BID  Continue Morphine 20 mg/ml 0.25-0.5 ml po Q 1 hour prn  10. Degeneration of intervertebral disc of lumbar region 11. Spinal stenosis, lumbar region, without neurogenic claudication  Continue Tylenol 500 mg po BID  Continue Morphine 20 mg/ml 0.25-0.5 ml po Q 1 hour prn  12. Psoriasis  Continue Triamcinolone 0.1% thin film to areas of psoriasis BID prn  Continue Neutrogena T-Gel (coal tar) [OTC] shampoo; 0.5 %; amt: enough to lather twice weekly  13. Hyperlipidemia, unspecified hyperlipidemia type  Stable   14. Impaired gait and mobility  Assist with ADLs  Use Hoyer lift for transfers from the bed to chair and to the BR  15. Anxiety  Continue Clonazepam - Schedule IV tablet; 1 mg; amt: 1 tablet PO Q HS  16. REM sleep behavior disorder  Trazodone 100 mg po Q HS  17.  Malignant Neoplasm of Prostate  Continue Alfuzosin tablet extended release 24 hr; 10 mg po Q Day  UTI Stat- 30 ml po Q day for UTI prevention  18. Chronic idiopathic constipation  Continue Sorbitol 70%- 45 mL po TID   Continue Bisacodyl 10 mg PR Q Day prn  Sorbitol 70%- 30 mL po Q 2 hours prn- constipation  19. Idiopathic thrombocytopenia Purpura  Continue Prednisone 5 mg PO Q Day   Family/ staff Communication:   Total  Time:  Documentation:  Face to Face:  Family/Phone:   Labs/tests ordered:    Medication list reviewed and assessed for continued appropriateness. Monthly medication orders reviewed and signed.  Vikki Ports, NP-C Geriatrics [facility] [phone], [place] [address] Cell Phone (Mon-Fri 8am-5pm):  [phone] On Call:  [phone] & follow prompts after 5pm & weekends Office Phone:  [phone] Office Fax:  [phone]

## 2017-07-30 ENCOUNTER — Encounter
Admission: RE | Admit: 2017-07-30 | Discharge: 2017-07-30 | Disposition: A | Discharge status: Home / Self Care | Payer: Medicare Other | Source: Ambulatory Visit | Attending: Internal Medicine | Admitting: Internal Medicine

## 2017-08-12 ENCOUNTER — Non-Acute Institutional Stay (SKILLED_NURSING_FACILITY): Payer: Medicare Other | Admitting: Gerontology

## 2017-08-12 ENCOUNTER — Encounter: Payer: Self-pay | Admitting: Gerontology

## 2017-08-12 DIAGNOSIS — M48061 Spinal stenosis, lumbar region without neurogenic claudication: Secondary | ICD-10-CM

## 2017-08-12 DIAGNOSIS — D649 Anemia, unspecified: Secondary | ICD-10-CM | POA: Diagnosis not present

## 2017-08-12 DIAGNOSIS — I5020 Unspecified systolic (congestive) heart failure: Secondary | ICD-10-CM

## 2017-08-12 DIAGNOSIS — G629 Polyneuropathy, unspecified: Secondary | ICD-10-CM | POA: Diagnosis not present

## 2017-08-12 DIAGNOSIS — K5904 Chronic idiopathic constipation: Secondary | ICD-10-CM

## 2017-08-12 DIAGNOSIS — G20 Parkinson's disease: Secondary | ICD-10-CM

## 2017-08-12 DIAGNOSIS — F324 Major depressive disorder, single episode, in partial remission: Secondary | ICD-10-CM

## 2017-08-12 DIAGNOSIS — M51369 Other intervertebral disc degeneration, lumbar region without mention of lumbar back pain or lower extremity pain: Secondary | ICD-10-CM

## 2017-08-12 DIAGNOSIS — I4891 Unspecified atrial fibrillation: Secondary | ICD-10-CM | POA: Diagnosis not present

## 2017-08-12 DIAGNOSIS — L4050 Arthropathic psoriasis, unspecified: Secondary | ICD-10-CM | POA: Diagnosis not present

## 2017-08-12 DIAGNOSIS — F419 Anxiety disorder, unspecified: Secondary | ICD-10-CM

## 2017-08-12 DIAGNOSIS — G20A1 Parkinson's disease without dyskinesia, without mention of fluctuations: Secondary | ICD-10-CM

## 2017-08-12 DIAGNOSIS — G4752 REM sleep behavior disorder: Secondary | ICD-10-CM

## 2017-08-12 DIAGNOSIS — L409 Psoriasis, unspecified: Secondary | ICD-10-CM

## 2017-08-12 DIAGNOSIS — M5136 Other intervertebral disc degeneration, lumbar region: Secondary | ICD-10-CM | POA: Diagnosis not present

## 2017-08-12 DIAGNOSIS — F0280 Dementia in other diseases classified elsewhere without behavioral disturbance: Secondary | ICD-10-CM

## 2017-08-12 DIAGNOSIS — I38 Endocarditis, valve unspecified: Secondary | ICD-10-CM | POA: Diagnosis not present

## 2017-08-12 DIAGNOSIS — E785 Hyperlipidemia, unspecified: Secondary | ICD-10-CM

## 2017-08-12 DIAGNOSIS — D693 Immune thrombocytopenic purpura: Secondary | ICD-10-CM

## 2017-08-12 DIAGNOSIS — C61 Malignant neoplasm of prostate: Secondary | ICD-10-CM

## 2017-08-12 DIAGNOSIS — R2689 Other abnormalities of gait and mobility: Secondary | ICD-10-CM

## 2017-08-12 NOTE — Progress Notes (Signed)
Location:   [facility] Room Number: [place] of Service:  SNF [phone]) Provider:  Toni Arthurs, NP-C  Sparks, Leonie Douglas, MD  Patient Care Team: Idelle Crouch, MD as PCP - General (Internal Medicine)  Extended Emergency Contact Information Primary Emergency Contact: Derl Barrow (Daughter)  [place] Phone: [phone] Relation: None  Code Status:  DNR Goals of care: Advanced Directive information Advanced Directives 08/12/2017  Does Patient Have a Medical Advance Directive? Yes  Type of Advance Directive Out of facility DNR (pink MOST or yellow form)  Does patient want to make changes to medical advance directive? No - Patient declined  Copy of [place] in Chart? -  Pre-existing out of facility DNR order (yellow form or pink MOST form) -     Chief Complaint  Patient presents with  . Medical Management of Chronic Issues    Routine Visit    HPI:  Pt is a 83 y.o. male seen today for medical management of chronic diseases. Pt has been stable over the past month with the chronic diseases. Pt has not had any falls this month. His tremors wax and wane. He has a Actuary that is with him the majority of the day and helps him with all his needs.  Otherwise, pt reports he is feeling well. He continues on Hospice services. 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 Pt was very excited about this. Typically, he is very sweet and always smiling. Pt does have ongoing issues with intermittent constipation. Denies pain or any other complaints at this time. No significant changes since last month. VSS.     Past Medical History:  Diagnosis Date  . Anemia, unspecified    pernicious  . Anxiety    unspecified  . Arthritis   . Atrial fibrillation ([place])   . CHF (congestive heart failure) ([place]) 01/2006  . Kidney stones   . Nephrolithiasis   . Parkinson's disease ([place])   . Peripheral  neuropathy   . Prostate cancer ([place]) 11/2006  . Psoriasis   . Psoriasis, unspecified   . Skin cancer   . Sleep disturbance    REM  . Spinal stenosis    s/p lumbar laminectomy  . Unspecified atrial fibrillation ([place])   . Upper GI bleed    unspecified; history of AVMs  . Valvular heart disease   . Vertigo    Past Surgical History:  Procedure Laterality Date  . CATARACT EXTRACTION    . EYE SURGERY    . INTERAL BLEEDING  10/2011   stomach  . MOHS SURGERY     Facial skin   . PROSTATE SURGERY    . SPINE SURGERY      Allergies  Allergen Reactions  . Other     Strawberries  . Penicillins     Allergies as of 08/12/2017      Reactions   Other    Strawberries   Penicillins       Medication List       Accurate as of 08/12/17 11:27 AM. Always use your most recent med list.          acetaminophen 325 MG tablet Commonly known as:  TYLENOL Take 650 mg by mouth every 4 (four) hours as needed.   acetaminophen 500 MG tablet Commonly known as:  TYLENOL Take 500 mg by mouth 2 (two) times daily. Maximum of 3000 mg of acetaminophen in 24  hours. Check prn acetaminophen order prior to administration due to limit.   alfuzosin 10 MG 24 hr tablet Commonly known as:  UROXATRAL Take 10 mg by mouth daily.   aspirin 325 MG EC tablet Take 325 mg by mouth daily.   AZO CRANBERRY 250-30 MG Tabs Take 1 tablet by mouth daily.   bisacodyl 10 MG suppository Commonly known as:  DULCOLAX Place 10 mg rectally daily as needed for moderate constipation.   buPROPion 300 MG 24 hr tablet Commonly known as:  WELLBUTRIN XL Take 300 mg by mouth daily.   carbidopa-levodopa 50-200 MG tablet Commonly known as:  SINEMET CR Take 1 tablet by mouth 2 (two) times daily.   carbidopa-levodopa 25-100 MG tablet Commonly known as:  SINEMET IR Take 1-2 tablets by mouth 2 (two) times daily as needed. Administer 1 tablet if no effect/decreased tremors in 1 hour, then may administer 2nd tablet     carbidopa-levodopa 25-100 MG tablet Commonly known as:  SINEMET IR Take 1 tablet by mouth 3 (three) times daily.   citalopram 20 MG tablet Commonly known as:  CELEXA Take 20 mg by mouth at bedtime.   clonazePAM 1 MG tablet Commonly known as:  KLONOPIN Take 1 mg by mouth at bedtime.   coal tar 0.5 % shampoo Commonly known as:  NEUTROGENA T-GEL Apply 1 application topically 2 (two) times a week. Enough to lather   ENDIT EX Apply liberal amount topically to areas of skin irritation 2 times daily and prn. Ok to leave at bedside.   magnesium hydroxide 400 MG/5ML suspension Commonly known as:  MILK OF MAGNESIA Take 30 mLs by mouth every 4 (four) hours as needed for mild constipation.   Melatonin 3 MG Tabs Take 3 mg by mouth at bedtime.   midodrine 2.5 MG tablet Commonly known as:  PROAMATINE Take 2.5 mg by mouth 3 (three) times daily with meals. Take along with sinemet   morphine 20 MG/ML concentrated solution Commonly known as:  ROXANOL Take 5-10 mg by mouth every hour as needed for severe pain.   NUTRITIONAL SUPPLEMENTS PO Give Magic Cup by mouth 2 times daily with lunch and supper tray.   ENSURE Take 1 Can by mouth daily.   Phenol 0.5 % Liqd Use as directed 2 sprays in the mouth or throat every 2 (two) hours as needed.   predniSONE 5 MG tablet Commonly known as:  DELTASONE Take 5 mg by mouth daily.   RISA-BID PROBIOTIC Tabs Take 1 tablet by mouth 2 (two) times daily.   sennosides-docusate sodium 8.6-50 MG tablet Commonly known as:  SENOKOT-S Take 2 tablets by mouth 2 (two) times daily.   sorbitol 70 % solution Take 45 mLs by mouth 3 (three) times daily.   sorbitol 70 % solution Take 30 mLs by mouth daily as needed. Give every 2 hours until pt has good BM. Then may change order to prn.   traZODone 100 MG tablet Commonly known as:  DESYREL Take 100 mg by mouth at bedtime.   triamcinolone cream 0.1 % Commonly known as:  KENALOG Apply 1 application  topically 2 (two) times daily. Apply to psoriatic rash       Review of Systems  Constitutional: Negative for activity change, appetite change, chills, diaphoresis, fatigue and fever.  HENT: Negative for congestion, dental problem, drooling, ear discharge, ear pain, facial swelling, mouth sores, postnasal drip, sinus pain, sore throat and trouble swallowing.   Eyes: Negative.   Respiratory: Negative for cough, chest tightness  and shortness of breath.   Cardiovascular: Negative for chest pain, palpitations and leg swelling.  Gastrointestinal: Negative for diarrhea, nausea and vomiting.  Genitourinary: Negative.   Musculoskeletal: Positive for arthralgias and gait problem.  Skin: Positive for rash (psoriasis).  Neurological: Positive for tremors, speech difficulty and weakness.  Psychiatric/Behavioral: Negative.   All other systems reviewed and are negative.   Immunization History  Administered Date(s) Administered  . Influenza-Unspecified 10/09/2012, 09/28/2016  . PPD Test 10/12/2016  . Pneumococcal-Unspecified 05/13/2014   Pertinent  Health Maintenance Due  Topic Date Due  . PNA vac Low Risk Adult (2 of 2 - PCV13) 05/14/2015  . INFLUENZA VACCINE  07/30/2017   No flowsheet data found. Functional Status Survey:    Vitals:   08/12/17 1101  BP: 130/81  Pulse: 72  Resp: 20  Temp: 98 F (36.7 C)  SpO2: 97%  Weight: 160 lb 12.8 oz (72.9 kg)  Height: 5\' 8"  (1.727 m)   Body mass index is 24.45 kg/m. Physical Exam  Constitutional: He is oriented to person, place, and time. He appears well-developed and well-nourished. No distress.  HENT:  Head: Normocephalic and atraumatic.  Eyes: Pupils are equal, round, and reactive to light. Conjunctivae and EOM are normal.  Neck: Normal range of motion. Neck supple. No JVD present. No thyroid mass present.  Cardiovascular: Normal rate, regular rhythm, normal heart sounds and intact distal pulses.  Exam reveals no gallop and no friction  rub.   No murmur heard. Pulmonary/Chest: Effort normal and breath sounds normal. No accessory muscle usage. No respiratory distress. He has no decreased breath sounds. He has no wheezes. He has no rhonchi. He has no rales. He exhibits no tenderness.  Abdominal: Soft. He exhibits no distension and no pulsatile midline mass. Bowel sounds are increased. There is no tenderness.  Abdomen firm, but non-tender  Musculoskeletal: He exhibits no edema or tenderness.  Neurological: He is alert and oriented to person, place, and time. He exhibits abnormal muscle tone. Coordination (Parkinson's) abnormal.  Skin: Skin is warm. No rash noted. He is not diaphoretic. No cyanosis or erythema. There is pallor. Nails show no clubbing.  Patient has psoriasis on multiple locations of the body. Large red patches on the feet and lower legs. No change from previous assessment.  Psychiatric: He has a normal mood and affect. His behavior is normal. Judgment and thought content normal.  Nursing note and vitals reviewed.   Labs reviewed:  Recent Labs  09/26/16 1435 06/05/17 0500  NA 136 137  K 4.7 4.0  CL 105 104  CO2 25 27  GLUCOSE 142* 88  BUN 27* 20  CREATININE 1.14 0.88  CALCIUM 8.7* 8.7*    Recent Labs  09/26/16 1435 06/05/17 0500  AST 23 17  ALT <5* <5*  ALKPHOS 63 78  BILITOT 0.7 0.6  PROT 5.6* 6.0*  ALBUMIN 3.4* 3.3*    Recent Labs  09/26/16 1435 06/05/17 0500  WBC 5.9 6.3  NEUTROABS 4.9 3.9  HGB 9.8* 11.2*  HCT 28.4* 32.8*  MCV 100.4* 97.4  PLT 186 179   Lab Results  Component Value Date   TSH 2.830 06/05/2017   No results found for: HGBA1C Lab Results  Component Value Date   CHOL 136 11/29/2015   HDL 44 11/29/2015   LDLCALC 80 11/29/2015   TRIG 59 11/29/2015   CHOLHDL 3.1 11/29/2015    Significant Diagnostic Results in last 30 days:  No results found.  Assessment/Plan 1. Parkinson's disease ([place])  Continue  Carbidopa-Levodopa ER 50-200- 1 tablet po Q HS  Continue  Carbidopa-Levodopa 25-100 1 1/2 tablets QID  Continue Midodrine 2.5 mg tablet po TID  Continue Carbidopa-Levodopa 25-100 1-2 tablets BID PRN- excessive tremors  2. Dementia in other diseases classified elsewhere without behavioral disturbance  Stable, slow progression   3. Major depressive disorder, single episode, in partial remission (HCC)  Stable  Continue Celexa 20 mg po Q HS  Increase Wellbutrin XL 300 mg po Q Day  4. Atrial fibrillation, unspecified type (HCC)  Stable  Continue Aspirin 325 mg po Q Day  5. Anemia, unspecified type  Stable   Ensure 1 can po Q Day  6. Heart valve disease  Continue Aspirin 325 mg po Q Day  7. Systolic congestive heart failure, unspecified congestive heart failure chronicity (HCC)  Stable   8. Polyneuropathy (HCC)  Stable   9. Psoriatic arthritis (HCC)  Continue Tylenol 500 mg po BID  Continue Morphine 20 mg/ml 0.25-0.5 ml po Q 1 hour prn  10. Degeneration of intervertebral disc of lumbar region 11. Spinal stenosis, lumbar region, without neurogenic claudication  Continue Tylenol 500 mg po BID  Continue Morphine 20 mg/ml 0.25-0.5 ml po Q 1 hour prn  12. Psoriasis  Continue Triamcinolone 0.1% thin film to areas of psoriasis BID prn  Continue Neutrogena T-Gel (coal tar) [OTC] shampoo; 0.5 %; amt: enough to lather twice weekly  13. Hyperlipidemia, unspecified hyperlipidemia type  Stable   14. Impaired gait and mobility  Assist with ADLs  Use Hoyer lift for transfers from the bed to chair and to the BR  15. Anxiety  Stable   Monitor  Discontinue Clonazepam - Schedule IV tablet; 1 mg; amt: 1 tablet PO Q HS  16. REM sleep behavior disorder  Stable   ContinueTrazodone 100 mg po Q HS  17.  Malignant Neoplasm of Prostate  Continue Alfuzosin tablet extended release 24 hr; 10 mg po Q Day  Discontinue UTI Stat- 30 ml po Q day for UTI prevention (can no longer tolerate the taste)  Azo Cranberry 250 mg  tablet po Q Day  18. 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 Monthly medication orders reviewed and signed.  Vikki Ports, NP-C Geriatrics [facility] [phone], [place] [address] Cell Phone (Mon-Fri 8am-5pm):  [phone] On Call:  [phone] & follow prompts after 5pm & weekends Office Phone:  [phone] Office Fax:  [phone]

## 2017-08-30 ENCOUNTER — Encounter
Admission: RE | Admit: 2017-08-30 | Discharge: 2017-08-30 | Disposition: A | Discharge status: Home / Self Care | Source: Ambulatory Visit | Attending: Internal Medicine | Admitting: Internal Medicine

## 2017-09-15 ENCOUNTER — Non-Acute Institutional Stay (SKILLED_NURSING_FACILITY): Payer: Medicare Other | Admitting: Gerontology

## 2017-09-15 ENCOUNTER — Encounter: Payer: Self-pay | Admitting: Gerontology

## 2017-09-15 DIAGNOSIS — G20A1 Parkinson's disease without dyskinesia, without mention of fluctuations: Secondary | ICD-10-CM

## 2017-09-15 DIAGNOSIS — I5020 Unspecified systolic (congestive) heart failure: Secondary | ICD-10-CM

## 2017-09-15 DIAGNOSIS — F0280 Dementia in other diseases classified elsewhere without behavioral disturbance: Secondary | ICD-10-CM

## 2017-09-15 DIAGNOSIS — G20 Parkinson's disease: Secondary | ICD-10-CM

## 2017-09-15 DIAGNOSIS — L4050 Arthropathic psoriasis, unspecified: Secondary | ICD-10-CM

## 2017-09-15 DIAGNOSIS — E785 Hyperlipidemia, unspecified: Secondary | ICD-10-CM

## 2017-09-15 DIAGNOSIS — F419 Anxiety disorder, unspecified: Secondary | ICD-10-CM

## 2017-09-15 DIAGNOSIS — G4752 REM sleep behavior disorder: Secondary | ICD-10-CM

## 2017-09-15 DIAGNOSIS — F324 Major depressive disorder, single episode, in partial remission: Secondary | ICD-10-CM | POA: Diagnosis not present

## 2017-09-15 DIAGNOSIS — G629 Polyneuropathy, unspecified: Secondary | ICD-10-CM

## 2017-09-15 DIAGNOSIS — M48061 Spinal stenosis, lumbar region without neurogenic claudication: Secondary | ICD-10-CM

## 2017-09-15 DIAGNOSIS — M5136 Other intervertebral disc degeneration, lumbar region: Secondary | ICD-10-CM

## 2017-09-15 DIAGNOSIS — L409 Psoriasis, unspecified: Secondary | ICD-10-CM

## 2017-09-15 DIAGNOSIS — I4891 Unspecified atrial fibrillation: Secondary | ICD-10-CM | POA: Diagnosis not present

## 2017-09-15 DIAGNOSIS — I38 Endocarditis, valve unspecified: Secondary | ICD-10-CM | POA: Diagnosis not present

## 2017-09-15 DIAGNOSIS — D649 Anemia, unspecified: Secondary | ICD-10-CM

## 2017-09-15 DIAGNOSIS — K5904 Chronic idiopathic constipation: Secondary | ICD-10-CM

## 2017-09-15 DIAGNOSIS — R2689 Other abnormalities of gait and mobility: Secondary | ICD-10-CM

## 2017-09-15 DIAGNOSIS — C61 Malignant neoplasm of prostate: Secondary | ICD-10-CM

## 2017-09-15 DIAGNOSIS — M51369 Other intervertebral disc degeneration, lumbar region without mention of lumbar back pain or lower extremity pain: Secondary | ICD-10-CM

## 2017-09-15 DIAGNOSIS — D693 Immune thrombocytopenic purpura: Secondary | ICD-10-CM

## 2017-09-15 NOTE — Progress Notes (Signed)
Location:   [facility] Room Number: 734L Place of Service:  SNF [phone]) Provider:  Toni Arthurs, NP-C  Sparks, Leonie Douglas, MD  Patient Care Team: Idelle Crouch, MD as PCP - General (Internal Medicine)  Extended Emergency Contact Information Primary Emergency Contact: Derl Barrow (Daughter)  [place] Phone: [phone] Relation: None  Code Status:  DNR Goals of care: Advanced Directive information Advanced Directives 09/15/2017  Does Patient Have a Medical Advance Directive? Yes  Type of Advance Directive Out of facility DNR (pink MOST or yellow form)  Does patient want to make changes to medical advance directive? No - Patient declined  Copy of [place] in Chart? -  Pre-existing out of facility DNR order (yellow form or pink MOST form) -     Chief Complaint  Patient presents with  . Medical Management of Chronic Issues    Routine Visit    HPI:  Pt is a 83 y.o. male seen today for medical management of chronic diseases. Pt has been stable over the past month with the chronic diseases. Pt has not had any falls this month. His tremors wax and wane. He has a Actuary that is with him the majority of the day and helps him with all his needs.  Pt reports he is not feeling well a lot of the time. He continues on Hospice services. Typically, he is very sweet and always smiling. Pt does have ongoing issues with intermittent constipation. Denies pain or any other complaints at this time. Pt seems to have had some non-specifec changes, decline since last month. VSS.     Past Medical History:  Diagnosis Date  . Anemia, unspecified    pernicious  . Anxiety    unspecified  . Arthritis   . Atrial fibrillation ([place])   . CHF (congestive heart failure) ([place]) 01/2006  . Kidney stones   . Nephrolithiasis   . Parkinson's disease ([place])   . Peripheral neuropathy   . Prostate cancer ([place]) 11/2006  . Psoriasis   .  Psoriasis, unspecified   . Skin cancer   . Sleep disturbance    REM  . Spinal stenosis    s/p lumbar laminectomy  . Unspecified atrial fibrillation ([place])   . Upper GI bleed    unspecified; history of AVMs  . Valvular heart disease   . Vertigo    Past Surgical History:  Procedure Laterality Date  . CATARACT EXTRACTION    . EYE SURGERY    . INTERAL BLEEDING  10/2011   stomach  . MOHS SURGERY     Facial skin   . PROSTATE SURGERY    . SPINE SURGERY      Allergies  Allergen Reactions  . Other     Strawberries  . Penicillins     Allergies as of 09/15/2017      Reactions   Other    Strawberries   Penicillins       Medication List       Accurate as of 09/15/17  2:09 PM. Always use your most recent med list.          acetaminophen 325 MG tablet Commonly known as:  TYLENOL Take 650 mg by mouth every 4 (four) hours as needed.   acetaminophen 500 MG tablet Commonly known as:  TYLENOL Take 500 mg by mouth 2 (two) times daily. Maximum of 3000 mg of acetaminophen in 24 hours. Check prn acetaminophen order prior to administration due  to limit.   alfuzosin 10 MG 24 hr tablet Commonly known as:  UROXATRAL Take 10 mg by mouth daily.   aspirin 325 MG EC tablet Take 325 mg by mouth daily.   bisacodyl 10 MG suppository Commonly known as:  DULCOLAX Place 10 mg rectally daily as needed for moderate constipation.   buPROPion 300 MG 24 hr tablet Commonly known as:  WELLBUTRIN XL Take 300 mg by mouth daily.   carbidopa-levodopa 50-200 MG tablet Commonly known as:  SINEMET CR Take 1 tablet by mouth 2 (two) times daily.   carbidopa-levodopa 25-100 MG tablet Commonly known as:  SINEMET IR Take 2 tablets by mouth 3 (three) times daily.   citalopram 20 MG tablet Commonly known as:  CELEXA Take 20 mg by mouth at bedtime.   clonazePAM 1 MG tablet Commonly known as:  KLONOPIN Take 1 mg by mouth at bedtime.   coal tar 0.5 % shampoo Commonly known as:  NEUTROGENA  T-GEL Apply 1 application topically 2 (two) times a week. Enough to lather   doxepin 10 MG/ML solution Commonly known as:  SINEQUAN Take 3 mg by mouth at bedtime.   ENDIT EX Apply liberal amount topically to areas of skin irritation 2 times daily and prn. Ok to leave at bedside.   magnesium hydroxide 400 MG/5ML suspension Commonly known as:  MILK OF MAGNESIA Take 30 mLs by mouth every 4 (four) hours as needed for mild constipation.   Melatonin 3 MG Tabs Take 3 mg by mouth at bedtime.   midodrine 2.5 MG tablet Commonly known as:  PROAMATINE Take 2.5 mg by mouth 3 (three) times daily with meals. Take along with sinemet   morphine 20 MG/ML concentrated solution Commonly known as:  ROXANOL Take 5-10 mg by mouth every hour as needed for severe pain.   NUTRITIONAL SUPPLEMENTS PO Give Magic Cup by mouth 2 times daily with lunch and supper tray.   Phenol 0.5 % Liqd Use as directed 2 sprays in the mouth or throat every 2 (two) hours as needed.   predniSONE 5 MG tablet Commonly known as:  DELTASONE Take 5 mg by mouth daily.   RISA-BID PROBIOTIC Tabs Take 1 tablet by mouth 2 (two) times daily.   sennosides-docusate sodium 8.6-50 MG tablet Commonly known as:  SENOKOT-S Take 2 tablets by mouth 2 (two) times daily.   sorbitol 70 % solution Take 45 mLs by mouth 3 (three) times daily.   sorbitol 70 % solution Take 30 mLs by mouth daily as needed. Give every 2 hours until pt has good BM. Then may change order to prn.   traZODone 100 MG tablet Commonly known as:  DESYREL Take 100 mg by mouth at bedtime.       Review of Systems  Constitutional: Negative for activity change, appetite change, chills, diaphoresis, fatigue and fever.  HENT: Negative for congestion, dental problem, drooling, ear discharge, ear pain, facial swelling, mouth sores, postnasal drip, sinus pain, sore throat and trouble swallowing.   Eyes: Negative.   Respiratory: Negative for cough, chest tightness and  shortness of breath.   Cardiovascular: Negative for chest pain, palpitations and leg swelling.  Gastrointestinal: Negative for diarrhea, nausea and vomiting.  Genitourinary: Negative.   Musculoskeletal: Positive for arthralgias and gait problem.  Skin: Positive for rash (psoriasis).  Neurological: Positive for tremors, speech difficulty and weakness.  Psychiatric/Behavioral: Negative.   All other systems reviewed and are negative.   Immunization History  Administered Date(s) Administered  . Influenza-Unspecified 10/09/2012, 09/28/2016  .  PPD Test 10/12/2016  . Pneumococcal-Unspecified 05/13/2014   Pertinent  Health Maintenance Due  Topic Date Due  . PNA vac Low Risk Adult (2 of 2 - PCV13) 05/14/2015  . INFLUENZA VACCINE  09/29/2017 (Originally 07/30/2017)   No flowsheet data found. Functional Status Survey:    Vitals:   09/15/17 1400  BP: 122/62  Pulse: 66  Resp: 20  Temp: 97.9 F (36.6 C)  SpO2: 97%  Weight: 149 lb 12.8 oz (67.9 kg)  Height: 5\' 8"  (1.727 m)   Body mass index is 22.78 kg/m. Physical Exam  Constitutional: He is oriented to person, place, and time. He appears well-developed and well-nourished. No distress.  HENT:  Head: Normocephalic and atraumatic.  Eyes: Pupils are equal, round, and reactive to light. Conjunctivae and EOM are normal.  Neck: Normal range of motion. Neck supple. No JVD present. No thyroid mass present.  Cardiovascular: Normal rate, regular rhythm, normal heart sounds and intact distal pulses.  Exam reveals no gallop and no friction rub.   No murmur heard. Pulmonary/Chest: Effort normal and breath sounds normal. No accessory muscle usage. No respiratory distress. He has no decreased breath sounds. He has no wheezes. He has no rhonchi. He has no rales. He exhibits no tenderness.  Abdominal: Soft. He exhibits no distension and no pulsatile midline mass. Bowel sounds are increased. There is no tenderness.  Abdomen firm, but non-tender    Musculoskeletal: He exhibits no edema or tenderness.  Neurological: He is alert and oriented to person, place, and time. He exhibits abnormal muscle tone. Coordination (Parkinson's) abnormal.  Skin: Skin is warm. No rash noted. He is not diaphoretic. No cyanosis or erythema. There is pallor. Nails show no clubbing.  Patient has psoriasis on multiple locations of the body. Large red patches on the feet and lower legs. No change from previous assessment.  Psychiatric: He has a normal mood and affect. His behavior is normal. Judgment and thought content normal.  Nursing note and vitals reviewed.   Labs reviewed:  Recent Labs  09/26/16 1435 06/05/17 0500  NA 136 137  K 4.7 4.0  CL 105 104  CO2 25 27  GLUCOSE 142* 88  BUN 27* 20  CREATININE 1.14 0.88  CALCIUM 8.7* 8.7*    Recent Labs  09/26/16 1435 06/05/17 0500  AST 23 17  ALT <5* <5*  ALKPHOS 63 78  BILITOT 0.7 0.6  PROT 5.6* 6.0*  ALBUMIN 3.4* 3.3*    Recent Labs  09/26/16 1435 06/05/17 0500  WBC 5.9 6.3  NEUTROABS 4.9 3.9  HGB 9.8* 11.2*  HCT 28.4* 32.8*  MCV 100.4* 97.4  PLT 186 179   Lab Results  Component Value Date   TSH 2.830 06/05/2017   No results found for: HGBA1C Lab Results  Component Value Date   CHOL 136 11/29/2015   HDL 44 11/29/2015   LDLCALC 80 11/29/2015   TRIG 59 11/29/2015   CHOLHDL 3.1 11/29/2015    Significant Diagnostic Results in last 30 days:  No results found.  Assessment/Plan 1. Parkinson's disease (HCC)  Continue Carbidopa-Levodopa ER 50-200- 1 tablet po BID  Continue Carbidopa-Levodopa 25-100 - 2 tablets TID  Continue Midodrine 2.5 mg tablet po TID  2. Dementia in other diseases classified elsewhere without behavioral disturbance  Stable, slow progression   3. Major depressive disorder, single episode, in partial remission (HCC)  Stable  Continue Celexa 20 mg po Q HS  Increase Wellbutrin XL 300 mg po Q Day  4. Atrial fibrillation, unspecified  type  (HCC)  Stable  Continue Aspirin 325 mg po Q Day  5. Anemia, unspecified type  Stable   Ensure 1 can po Q Day  6. Heart valve disease  Stable   Continue Aspirin 325 mg po Q Day  7. Systolic congestive heart failure, unspecified congestive heart failure chronicity (HCC)  Stable   8. Polyneuropathy (HCC)  Stable   9. Psoriatic arthritis (HCC)  Stable   Continue Tylenol 500 mg po BID  Continue Morphine 20 mg/ml 0.25-0.5 ml po Q 1 hour prn  10. Degeneration of intervertebral disc of lumbar region 11. Spinal stenosis, lumbar region, without neurogenic claudication  Stable   Continue Tylenol 500 mg po BID  Continue Morphine 20 mg/ml 0.25-0.5 ml po Q 1 hour prn  12. Psoriasis  Stable   Continue Triamcinolone 0.1% thin film to areas of psoriasis BID prn  Continue Neutrogena T-Gel (coal tar) [OTC] shampoo; 0.5 %; amt: enough to lather twice weekly  13. Hyperlipidemia, unspecified hyperlipidemia type  Stable   14. Impaired gait and mobility  Assist with ADLs  Use Hoyer lift for transfers from the bed to chair and to the BR  15. Anxiety  Stable   Monitor  Re-start Clonazepam - Schedule IV tablet; 1 mg; amt: 1 tablet PO Q HS  16. REM sleep behavior disorder  Stable   Continue Trazodone 100 mg po Q HS  Add Doxepin 3 mg po Q HS  17.  Malignant Neoplasm of Prostate  Stable   Continue Alfuzosin tablet extended release 24 hr; 10 mg po Q Day  18. Chronic idiopathic constipation  Continue Sorbitol 70%- 45 mL po TID   Add Senna S- 2 tablets po TID  Continue Bisacodyl 10 mg PR Q Day prn  Continue Sorbitol 70%- 30 mL po Q 2 hours prn- constipation  19. Idiopathic thrombocytopenia Purpura  Continue Prednisone 5 mg PO Q Day   Family/ staff Communication:   Total Time:  Documentation:  Face to Face:  Family/Phone:   Labs/tests ordered:    Medication list reviewed and assessed for continued appropriateness. Monthly medication orders  reviewed and signed.  Vikki Ports, NP-C Geriatrics [facility] [phone], [place] [address] Cell Phone (Mon-Fri 8am-5pm):  [phone] On Call:  [phone] & follow prompts after 5pm & weekends Office Phone:  [phone] Office Fax:  [phone]

## 2017-09-23 ENCOUNTER — Other Ambulatory Visit: Payer: Self-pay

## 2017-09-23 MED ORDER — CLONAZEPAM 1 MG PO TABS: 1.0000 mg | ORAL_TABLET | Freq: Every day | ORAL | 3 refills | Status: DC

## 2017-09-23 NOTE — Telephone Encounter (Signed)
Rx sent to [facility] phone : [phone] , fax : [phone]  

## 2017-09-29 ENCOUNTER — Encounter
Admission: RE | Admit: 2017-09-29 | Discharge: 2017-09-29 | Disposition: A | Discharge status: Home / Self Care | Payer: Medicare Other | Source: Ambulatory Visit | Attending: Internal Medicine | Admitting: Internal Medicine

## 2017-10-23 ENCOUNTER — Encounter: Payer: Self-pay | Admitting: Gerontology

## 2017-10-23 ENCOUNTER — Non-Acute Institutional Stay (SKILLED_NURSING_FACILITY): Payer: Medicare Other | Admitting: Gerontology

## 2017-10-23 DIAGNOSIS — D693 Immune thrombocytopenic purpura: Secondary | ICD-10-CM

## 2017-10-23 DIAGNOSIS — R2689 Other abnormalities of gait and mobility: Secondary | ICD-10-CM

## 2017-10-23 DIAGNOSIS — I38 Endocarditis, valve unspecified: Secondary | ICD-10-CM

## 2017-10-23 DIAGNOSIS — D649 Anemia, unspecified: Secondary | ICD-10-CM

## 2017-10-23 DIAGNOSIS — M51369 Other intervertebral disc degeneration, lumbar region without mention of lumbar back pain or lower extremity pain: Secondary | ICD-10-CM

## 2017-10-23 DIAGNOSIS — I4891 Unspecified atrial fibrillation: Secondary | ICD-10-CM

## 2017-10-23 DIAGNOSIS — M48061 Spinal stenosis, lumbar region without neurogenic claudication: Secondary | ICD-10-CM

## 2017-10-23 DIAGNOSIS — G20A1 Parkinson's disease without dyskinesia, without mention of fluctuations: Secondary | ICD-10-CM

## 2017-10-23 DIAGNOSIS — L409 Psoriasis, unspecified: Secondary | ICD-10-CM

## 2017-10-23 DIAGNOSIS — F0280 Dementia in other diseases classified elsewhere without behavioral disturbance: Secondary | ICD-10-CM

## 2017-10-23 DIAGNOSIS — G4752 REM sleep behavior disorder: Secondary | ICD-10-CM

## 2017-10-23 DIAGNOSIS — E785 Hyperlipidemia, unspecified: Secondary | ICD-10-CM

## 2017-10-23 DIAGNOSIS — L4050 Arthropathic psoriasis, unspecified: Secondary | ICD-10-CM | POA: Diagnosis not present

## 2017-10-23 DIAGNOSIS — K5904 Chronic idiopathic constipation: Secondary | ICD-10-CM

## 2017-10-23 DIAGNOSIS — G20 Parkinson's disease: Secondary | ICD-10-CM

## 2017-10-23 DIAGNOSIS — F419 Anxiety disorder, unspecified: Secondary | ICD-10-CM

## 2017-10-23 DIAGNOSIS — I5020 Unspecified systolic (congestive) heart failure: Secondary | ICD-10-CM | POA: Diagnosis not present

## 2017-10-23 DIAGNOSIS — F324 Major depressive disorder, single episode, in partial remission: Secondary | ICD-10-CM | POA: Diagnosis not present

## 2017-10-23 DIAGNOSIS — M5136 Other intervertebral disc degeneration, lumbar region: Secondary | ICD-10-CM

## 2017-10-23 DIAGNOSIS — G629 Polyneuropathy, unspecified: Secondary | ICD-10-CM | POA: Diagnosis not present

## 2017-10-23 DIAGNOSIS — C61 Malignant neoplasm of prostate: Secondary | ICD-10-CM

## 2017-10-30 ENCOUNTER — Non-Acute Institutional Stay (SKILLED_NURSING_FACILITY): Payer: Medicare Other | Admitting: Gerontology

## 2017-10-30 ENCOUNTER — Encounter
Admission: RE | Admit: 2017-10-30 | Discharge: 2017-10-30 | Disposition: A | Discharge status: Home / Self Care | Payer: Medicare Other | Source: Ambulatory Visit | Attending: Internal Medicine | Admitting: Internal Medicine

## 2017-10-30 ENCOUNTER — Encounter: Payer: Self-pay | Admitting: Gerontology

## 2017-10-30 DIAGNOSIS — G20 Parkinson's disease: Secondary | ICD-10-CM

## 2017-10-30 NOTE — Progress Notes (Signed)
Location:   [facility] Room Number: 998P Place of Service:  SNF [phone]) Provider:  Toni Arthurs, NP-C  Sparks, Leonie Douglas, MD  Patient Care Team: Idelle Crouch, MD as PCP - General (Internal Medicine)  Extended Emergency Contact Information Primary Emergency Contact: Derl Barrow (Daughter)  [place] Phone: [phone] Relation: None  Code Status:  DNR Goals of care: Advanced Directive information Advanced Directives 10/30/2017  Does Patient Have a Medical Advance Directive? Yes  Type of Advance Directive Out of facility DNR (pink MOST or yellow form)  Does patient want to make changes to medical advance directive? No - Patient declined  Copy of [place] in Chart? -  Pre-existing out of facility DNR order (yellow form or pink MOST form) -     Chief Complaint  Patient presents with  . Acute Visit    Recheck increased agitaition    HPI:  Pt is a 83 y.o. male seen today for an acute visit for increased agitation and worsening tremors.  Staff and sitter are concerned about patient's worsening mood, becoming more withdrawn, increased depressive symptoms.  Staff also reports patient becoming very upset with himself when he was corrected before making inappropriate/sexually implied comments to a young staff member.  This is not in the patient's normal character.  Patient is also becoming increasingly upset because of his worsening memory and decreased ability to finish sentences and carry on conversation.  Hand tremors have gotten worse.  They have become very disruptive to the patient's ability to feed himself, etc. patient is becoming more withdrawn, talking and interacting less.  Does not open eyes or make eye contact with others.  Vital signs stable.   Past Medical History:  Diagnosis Date  . Anemia, unspecified    pernicious  . Anxiety    unspecified  . Arthritis   . Atrial fibrillation ([place])   . CHF  (congestive heart failure) ([place]) 01/2006  . Kidney stones   . Nephrolithiasis   . Parkinson's disease ([place])   . Peripheral neuropathy   . Prostate cancer ([place]) 11/2006  . Psoriasis   . Psoriasis, unspecified   . Skin cancer   . Sleep disturbance    REM  . Spinal stenosis    s/p lumbar laminectomy  . Unspecified atrial fibrillation ([place])   . Upper GI bleed    unspecified; history of AVMs  . Valvular heart disease   . Vertigo    Past Surgical History:  Procedure Laterality Date  . CATARACT EXTRACTION    . EYE SURGERY    . INTERAL BLEEDING  10/2011   stomach  . MOHS SURGERY     Facial skin   . PROSTATE SURGERY    . SPINE SURGERY      Allergies  Allergen Reactions  . Other     Strawberries  . Penicillins     Allergies as of 10/30/2017      Reactions   Other    Strawberries   Penicillins       Medication List       Accurate as of 10/30/17  3:01 PM. Always use your most recent med list.          acetaminophen 325 MG tablet Commonly known as:  TYLENOL Take 650 mg by mouth every 4 (four) hours as needed.   acetaminophen 500 MG tablet Commonly known as:  TYLENOL Take 500 mg by mouth 2 (two) times daily. Maximum of 3000  mg of acetaminophen in 24 hours. Check prn acetaminophen order prior to administration due to limit.   alfuzosin 10 MG 24 hr tablet Commonly known as:  UROXATRAL Take 10 mg by mouth daily.   aspirin 325 MG EC tablet Take 325 mg by mouth daily.   bisacodyl 10 MG suppository Commonly known as:  DULCOLAX Place 10 mg rectally daily as needed for moderate constipation.   buPROPion 300 MG 24 hr tablet Commonly known as:  WELLBUTRIN XL Take 300 mg by mouth daily.   carbidopa-levodopa 50-200 MG tablet Commonly known as:  SINEMET CR Take 1 tablet by mouth 2 (two) times daily.   carbidopa-levodopa 25-100 MG tablet Commonly known as:  SINEMET IR Take 2 tablets by mouth 3 (three) times daily.   citalopram 20 MG tablet Commonly known as:   CELEXA Take 20 mg by mouth at bedtime.   clonazePAM 1 MG tablet Commonly known as:  KLONOPIN Take 1 tablet (1 mg total) by mouth at bedtime.   coal tar 0.5 % shampoo Commonly known as:  NEUTROGENA T-GEL Apply 1 application topically 2 (two) times a week. Enough to lather   doxepin 10 MG/ML solution Commonly known as:  SINEQUAN Take 3 mg by mouth at bedtime.   ENDIT EX Apply liberal amount topically to areas of skin irritation 2 times daily and prn. Ok to leave at bedside.   magnesium hydroxide 400 MG/5ML suspension Commonly known as:  MILK OF MAGNESIA Take 30 mLs by mouth every 4 (four) hours as needed for mild constipation.   Melatonin 3 MG Tabs Take 3 mg by mouth at bedtime.   midodrine 2.5 MG tablet Commonly known as:  PROAMATINE Take 2.5 mg by mouth 3 (three) times daily with meals. Take along with sinemet   morphine 20 MG/ML concentrated solution Commonly known as:  ROXANOL Take 5-10 mg by mouth every hour as needed for severe pain.   NUTRITIONAL SUPPLEMENTS PO Give Magic Cup by mouth 2 times daily with lunch and supper tray.   Phenol 0.5 % Liqd Use as directed 2 sprays in the mouth or throat every 2 (two) hours as needed.   predniSONE 5 MG tablet Commonly known as:  DELTASONE Take 5 mg by mouth daily.   RISA-BID PROBIOTIC Tabs Take 1 tablet by mouth 2 (two) times daily.   sennosides-docusate sodium 8.6-50 MG tablet Commonly known as:  SENOKOT-S Take 2 tablets by mouth 3 (three) times daily.   sorbitol 70 % solution Take 45 mLs by mouth 3 (three) times daily.   sorbitol 70 % solution Take 30 mLs by mouth daily as needed. Give every 2 hours until pt has good BM. Then may change order to prn.   traZODone 50 MG tablet Commonly known as:  DESYREL Take 50 mg by mouth at bedtime.       Review of Systems  Constitutional: Negative for activity change, appetite change, chills, diaphoresis, fatigue and fever.  HENT: Negative for congestion, dental problem,  drooling, ear discharge, ear pain, facial swelling, mouth sores, postnasal drip, sinus pain, sore throat and trouble swallowing.   Eyes: Negative.   Respiratory: Negative for cough, chest tightness and shortness of breath.   Cardiovascular: Negative for chest pain, palpitations and leg swelling.  Gastrointestinal: Negative for diarrhea, nausea and vomiting.  Genitourinary: Negative.   Musculoskeletal: Positive for arthralgias and gait problem.  Skin: Positive for rash (psoriasis).  Neurological: Positive for tremors, speech difficulty and weakness.  Psychiatric/Behavioral: Positive for agitation, dysphoric mood and  sleep disturbance.  All other systems reviewed and are negative.   Immunization History  Administered Date(s) Administered  . Influenza, High Dose Seasonal PF 09/22/2017  . Influenza-Unspecified 10/09/2012, 09/28/2016  . PPD Test 10/12/2016  . Pneumococcal-Unspecified 05/13/2014   Pertinent  Health Maintenance Due  Topic Date Due  . PNA vac Low Risk Adult (2 of 2 - PCV13) 05/14/2015  . INFLUENZA VACCINE  Completed   No flowsheet data found. Functional Status Survey:    Vitals:   10/30/17 1451  BP: (!) 88/65  Pulse: 85  Resp: 18  Temp: 98.3 F (36.8 C)  SpO2: 91%  Weight: 152 lb 9.6 oz (69.2 kg)  Height: 5\' 8"  (1.727 m)   Body mass index is 23.2 kg/m. Physical Exam  Constitutional: He is oriented to person, place, and time. Vital signs are normal. He appears well-developed and well-nourished. No distress.  HENT:  Head: Normocephalic and atraumatic.  Eyes: Conjunctivae and EOM are normal. Pupils are equal, round, and reactive to light.  Neck: Normal range of motion. Neck supple. No JVD present. No thyroid mass present.  Cardiovascular: Normal rate, normal heart sounds and intact distal pulses. An irregular rhythm present. Exam reveals no gallop and no friction rub.  No murmur heard. Pulses:      Dorsalis pedis pulses are 2+ on the right side, and 2+ on the  left side.  No edema  Pulmonary/Chest: Effort normal and breath sounds normal. No accessory muscle usage. No respiratory distress. He has no decreased breath sounds. He has no wheezes. He has no rhonchi. He has no rales. He exhibits no tenderness.  Abdominal: Soft. He exhibits no distension and no pulsatile midline mass. Bowel sounds are increased. There is no tenderness.  Abdomen firm, but non-tender  Musculoskeletal: He exhibits no edema or tenderness.  Neurological: He is alert and oriented to person, place, and time. He displays tremor. He exhibits abnormal muscle tone. Coordination (Parkinson's) abnormal.  Skin: Skin is warm. No rash noted. He is not diaphoretic. No cyanosis or erythema. There is pallor. Nails show no clubbing.  Patient has psoriasis on multiple locations of the body. Large red patches on the feet and lower legs. No change from previous assessment.  Psychiatric: Thought content normal. His affect is blunt. He is agitated and withdrawn. Cognition and memory are impaired. He expresses inappropriate judgment. He exhibits abnormal recent memory.  Nursing note and vitals reviewed.   Labs reviewed:  Recent Labs  06/05/17 0500  NA 137  K 4.0  CL 104  CO2 27  GLUCOSE 88  BUN 20  CREATININE 0.88  CALCIUM 8.7*    Recent Labs  06/05/17 0500  AST 17  ALT <5*  ALKPHOS 78  BILITOT 0.6  PROT 6.0*  ALBUMIN 3.3*    Recent Labs  06/05/17 0500  WBC 6.3  NEUTROABS 3.9  HGB 11.2*  HCT 32.8*  MCV 97.4  PLT 179   Lab Results  Component Value Date   TSH 2.830 06/05/2017   No results found for: HGBA1C Lab Results  Component Value Date   CHOL 136 11/29/2015   HDL 44 11/29/2015   LDLCALC 80 11/29/2015   TRIG 59 11/29/2015   CHOLHDL 3.1 11/29/2015    Significant Diagnostic Results in last 30 days:  No results found.  Assessment/Plan 1.Parkinson's Disease  Decrease Clonazepam to 0.5 mg po Q HS  Seroquel 25 mg po BID  DC Doxepin  Change Trazodone to  50 mg po Q HS prn  Family/  staff Communication:   Total Time:  Documentation:  Face to Face:  Family/Phone:   Labs/tests ordered:    Medication list reviewed and assessed for continued appropriateness.  Vikki Ports, NP-C Geriatrics [facility] [phone], [place] [address] Cell Phone (Mon-Fri 8am-5pm):  [phone] On Call:  [phone] & follow prompts after 5pm & weekends Office Phone:  [phone] Office Fax:  [phone]

## 2017-11-05 NOTE — Progress Notes (Signed)
Location:   [facility] Room Number: 161W Place of Service:  SNF [phone]) Provider:  Toni Arthurs, NP-C  [place], Leonie Douglas, MD  Patient Care Team: Idelle Crouch, MD as PCP - General (Internal Medicine)  Extended Emergency Contact Information Primary Emergency Contact: Derl Barrow (Daughter)  [place] Phone: [phone] Relation: None  Code Status:  DNR Goals of care: Advanced Directive information Advanced Directives 10/30/2017  Does Patient Have a Medical Advance Directive? Yes  Type of Advance Directive Out of facility DNR (pink MOST or yellow form)  Does patient want to make changes to medical advance directive? No - Patient declined  Copy of [place] in Chart? -  Pre-existing out of facility DNR order (yellow form or pink MOST form) -     Chief Complaint  Patient presents with  . Medical Management of Chronic Issues    Routine Visit    HPI:  Pt is a 83 y.o. male seen today for medical management of chronic diseases.  Patient has been stable over the past month with chronic diseases.  Patient has not had any falls this month.  His tremors wax and wane.  He has a Actuary that is with him the majority of the day and helps him with all his needs.  Patient reports he is not feeling well a lot of the time.  He continues on hospice services.  Typically, he is very sweet and always smiling.  He does have ongoing issues with intermittent constipation.  Denies pain or any other complaints at this time.  Patient seems to have had some nonspecific changes, increased decline since last month.  Vital signs stable.   Past Medical History:  Diagnosis Date  . Anemia, unspecified    pernicious  . Anxiety    unspecified  . Arthritis   . Atrial fibrillation ([place])   . CHF (congestive heart failure) ([place]) 01/2006  . Kidney stones   . Nephrolithiasis   . Parkinson's disease ([place])   . Peripheral neuropathy   .  Prostate cancer ([place]) 11/2006  . Psoriasis   . Psoriasis, unspecified   . Skin cancer   . Sleep disturbance    REM  . Spinal stenosis    s/p lumbar laminectomy  . Unspecified atrial fibrillation ([place])   . Upper GI bleed    unspecified; history of AVMs  . Valvular heart disease   . Vertigo    Past Surgical History:  Procedure Laterality Date  . CATARACT EXTRACTION    . EYE SURGERY    . INTERAL BLEEDING  10/2011   stomach  . MOHS SURGERY     Facial skin   . PROSTATE SURGERY    . SPINE SURGERY      Allergies  Allergen Reactions  . Other     Strawberries  . Penicillins     Allergies as of 10/23/2017      Reactions   Other    Strawberries   Penicillins       Medication List        Accurate as of 10/23/17 11:59 PM. Always use your most recent med list.          acetaminophen 325 MG tablet Commonly known as:  TYLENOL Take 650 mg by mouth every 4 (four) hours as needed.   acetaminophen 500 MG tablet Commonly known as:  TYLENOL Take 500 mg by mouth 2 (two) times daily. Maximum of 3000 mg of acetaminophen in  24 hours. Check prn acetaminophen order prior to administration due to limit.   alfuzosin 10 MG 24 hr tablet Commonly known as:  UROXATRAL Take 10 mg by mouth daily.   aspirin 325 MG EC tablet Take 325 mg by mouth daily.   bisacodyl 10 MG suppository Commonly known as:  DULCOLAX Place 10 mg rectally daily as needed for moderate constipation.   buPROPion 300 MG 24 hr tablet Commonly known as:  WELLBUTRIN XL Take 300 mg by mouth daily.   carbidopa-levodopa 50-200 MG tablet Commonly known as:  SINEMET CR Take 1 tablet by mouth 2 (two) times daily.   carbidopa-levodopa 25-100 MG tablet Commonly known as:  SINEMET IR Take 2 tablets by mouth 3 (three) times daily.   citalopram 20 MG tablet Commonly known as:  CELEXA Take 20 mg by mouth at bedtime.   clonazePAM 1 MG tablet Commonly known as:  KLONOPIN Take 1 tablet (1 mg total) by mouth at  bedtime.   coal tar 0.5 % shampoo Commonly known as:  NEUTROGENA T-GEL Apply 1 application topically 2 (two) times a week. Enough to lather   doxepin 10 MG/ML solution Commonly known as:  SINEQUAN Take 3 mg by mouth at bedtime.   ENDIT EX Apply liberal amount topically to areas of skin irritation 2 times daily and prn. Ok to leave at bedside.   magnesium hydroxide 400 MG/5ML suspension Commonly known as:  MILK OF MAGNESIA Take 30 mLs by mouth every 4 (four) hours as needed for mild constipation.   Melatonin 3 MG Tabs Take 3 mg by mouth at bedtime.   midodrine 2.5 MG tablet Commonly known as:  PROAMATINE Take 2.5 mg by mouth 3 (three) times daily with meals. Take along with sinemet   morphine 20 MG/ML concentrated solution Commonly known as:  ROXANOL Take 5-10 mg by mouth every hour as needed for severe pain.   NUTRITIONAL SUPPLEMENTS PO Give Magic Cup by mouth 2 times daily with lunch and supper tray.   Phenol 0.5 % Liqd Use as directed 2 sprays in the mouth or throat every 2 (two) hours as needed.   predniSONE 5 MG tablet Commonly known as:  DELTASONE Take 5 mg by mouth daily.   RISA-BID PROBIOTIC Tabs Take 1 tablet by mouth 2 (two) times daily.   sennosides-docusate sodium 8.6-50 MG tablet Commonly known as:  SENOKOT-S Take 2 tablets by mouth 3 (three) times daily.   sorbitol 70 % solution Take 45 mLs by mouth 3 (three) times daily.   sorbitol 70 % solution Take 30 mLs by mouth daily as needed. Give every 2 hours until pt has good BM. Then may change order to prn.   traZODone 50 MG tablet Commonly known as:  DESYREL Take 50 mg by mouth at bedtime.       Review of Systems  Constitutional: Negative for activity change, appetite change, chills, diaphoresis, fatigue and fever.  HENT: Negative for congestion, dental problem, drooling, ear discharge, ear pain, facial swelling, mouth sores, postnasal drip, sinus pain, sneezing, sore throat, trouble swallowing  and voice change.   Eyes: Negative.  Negative for pain, redness and visual disturbance.  Respiratory: Negative for apnea, cough, choking, chest tightness, shortness of breath and wheezing.   Cardiovascular: Negative for chest pain, palpitations and leg swelling.  Gastrointestinal: Negative for abdominal distention, abdominal pain, constipation, diarrhea, nausea and vomiting.  Genitourinary: Negative.  Negative for difficulty urinating, dysuria, frequency and urgency.  Musculoskeletal: Positive for arthralgias and gait problem. Negative  for back pain and myalgias.  Skin: Positive for rash (psoriasis). Negative for color change, pallor and wound.  Neurological: Positive for tremors, speech difficulty and weakness. Negative for dizziness, syncope, numbness and headaches.  Psychiatric/Behavioral: Positive for confusion. Negative for agitation and behavioral problems.  All other systems reviewed and are negative.   Immunization History  Administered Date(s) Administered  . Influenza, High Dose Seasonal PF 09/22/2017  . Influenza-Unspecified 10/09/2012, 09/28/2016  . PPD Test 10/12/2016  . Pneumococcal-Unspecified 05/13/2014   Pertinent  Health Maintenance Due  Topic Date Due  . PNA vac Low Risk Adult (2 of 2 - PCV13) 05/14/2015  . INFLUENZA VACCINE  Completed   No flowsheet data found. Functional Status Survey:    Vitals:   10/23/17 1047  BP: (!) 88/65  Pulse: 85  Resp: 18  Temp: 98.3 F (36.8 C)  SpO2: 91%  Weight: 161 lb 3.2 oz (73.1 kg)  Height: 5\' 8"  (1.727 m)   Body mass index is 24.51 kg/m. Physical Exam  Constitutional: He is oriented to person, place, and time. Vital signs are normal. He appears well-developed and well-nourished. He is active and cooperative. He does not appear ill. No distress.  HENT:  Head: Normocephalic and atraumatic.  Mouth/Throat: Uvula is midline, oropharynx is clear and moist and mucous membranes are normal. Mucous membranes are not pale, not  dry and not cyanotic.  Eyes: Conjunctivae, EOM and lids are normal. Pupils are equal, round, and reactive to light.  Neck: Trachea normal, normal range of motion and full passive range of motion without pain. Neck supple. No JVD present. No tracheal deviation, no edema and no erythema present. No thyroid mass and no thyromegaly present.  Cardiovascular: Normal rate, regular rhythm, normal heart sounds, intact distal pulses and normal pulses. Exam reveals no gallop, no distant heart sounds and no friction rub.  No murmur heard. Pulmonary/Chest: Effort normal and breath sounds normal. No accessory muscle usage. No respiratory distress. He has no decreased breath sounds. He has no wheezes. He has no rhonchi. He has no rales. He exhibits no tenderness.  Abdominal: Soft. Normal appearance. He exhibits no distension, no ascites and no pulsatile midline mass. Bowel sounds are increased. There is no tenderness.  Abdomen firm, but non-tender  Musculoskeletal: Normal range of motion. He exhibits no edema or tenderness.  Expected osteoarthritis, stiffness  Neurological: He is alert and oriented to person, place, and time. He has normal strength. He exhibits abnormal muscle tone. Coordination (Parkinson's) abnormal.  Skin: Skin is warm, dry and intact. No rash noted. He is not diaphoretic. No cyanosis or erythema. There is pallor. Nails show no clubbing.  Patient has psoriasis on multiple locations of the body. Large red patches on the feet and lower legs. No change from previous assessment.  Psychiatric: He has a normal mood and affect. His speech is normal and behavior is normal. Judgment and thought content normal. Cognition and memory are normal.  Nursing note and vitals reviewed.   Labs reviewed: Recent Labs    06/05/17 0500  NA 137  K 4.0  CL 104  CO2 27  GLUCOSE 88  BUN 20  CREATININE 0.88  CALCIUM 8.7*   Recent Labs    06/05/17 0500  AST 17  ALT <5*  ALKPHOS 78  BILITOT 0.6  PROT  6.0*  ALBUMIN 3.3*   Recent Labs    06/05/17 0500  WBC 6.3  NEUTROABS 3.9  HGB 11.2*  HCT 32.8*  MCV 97.4  PLT 179  Lab Results  Component Value Date   TSH 2.830 06/05/2017   No results found for: HGBA1C Lab Results  Component Value Date   CHOL 136 11/29/2015   HDL 44 11/29/2015   LDLCALC 80 11/29/2015   TRIG 59 11/29/2015   CHOLHDL 3.1 11/29/2015    Significant Diagnostic Results in last 30 days:  No results found.  Assessment/Plan 1. Parkinson's disease (HCC)  Continue Carbidopa-Levodopa ER 50-200- 1 tablet po BID  Continue Carbidopa-Levodopa 25-100 - 2 tablets TID  Continue Midodrine 2.5 mg tablet po TID  2. Dementia in other diseases classified elsewhere without behavioral disturbance  Stable, slow progression   3. Major depressive disorder, single episode, in partial remission (HCC)  Stable  Continue Celexa 20 mg po Q HS  Continue Wellbutrin XL 300 mg po Q Day  4. Atrial fibrillation, unspecified type (HCC)  Stable  Continue Aspirin 325 mg po Q Day  5. Anemia, unspecified type  Stable   Ensure 1 can po Q Day  6. Heart valve disease  Stable   Continue Aspirin 325 mg po Q Day  7. Systolic congestive heart failure, unspecified congestive heart failure chronicity (HCC)  Stable   8. Polyneuropathy (HCC)  Stable   9. Psoriatic arthritis (HCC)  Stable   Continue Tylenol 500 mg po BID  Continue Morphine 20 mg/ml 0.25-0.5 ml po Q 1 hour prn  10. Degeneration of intervertebral disc of lumbar region 11. Spinal stenosis, lumbar region, without neurogenic claudication  Stable   Continue Tylenol 500 mg po BID  Continue Morphine 20 mg/ml 0.25-0.5 ml po Q 1 hour prn  12. Psoriasis  Stable   Continue Triamcinolone 0.1% thin film to areas of psoriasis BID prn  Continue Neutrogena T-Gel (coal tar) [OTC] shampoo; 0.5 %; amt: enough to lather twice weekly  13. Hyperlipidemia, unspecified hyperlipidemia type  Stable   14.  Impaired gait and mobility  Assist with ADLs  Use Hoyer lift for transfers from the bed to chair and to the BR  15. Anxiety  Stable   Monitor  Continue Clonazepam - Schedule IV tablet; 1 mg; amt: 1 tablet PO Q HS  16. REM sleep behavior disorder  Stable   Decrease Trazodone 50 mg po Q HS  Continue Doxepin 3 mg po Q HS  17.  Malignant Neoplasm of Prostate  Stable   Continue Alfuzosin tablet extended release 24 hr; 10 mg po Q Day  18. Chronic idiopathic constipation  Continue Sorbitol 70%- 45 mL po TID   Continue Senna S- 2 tablets po TID  Continue Bisacodyl 10 mg PR Q Day prn  Continue Sorbitol 70%- 30 mL po Q 2 hours prn- constipation  19. 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 Monthly medication orders reviewed and signed.  Vikki Ports, NP-C Geriatrics [facility] [phone], [place] [address] Cell Phone (Mon-Fri 8am-5pm):  [phone] On Call:  [phone] & follow prompts after 5pm & weekends Office Phone:  [phone] Office Fax:  [phone]

## 2017-11-15 ENCOUNTER — Other Ambulatory Visit
Admission: RE | Admit: 2017-11-15 | Discharge: 2017-11-15 | Disposition: A | Discharge status: Home / Self Care | Source: Skilled Nursing Facility | Attending: Internal Medicine | Admitting: Internal Medicine

## 2017-11-15 DIAGNOSIS — R410 Disorientation, unspecified: Secondary | ICD-10-CM | POA: Diagnosis not present

## 2017-11-15 LAB — URINALYSIS, COMPLETE (UACMP) WITH MICROSCOPIC
BACTERIA UA: NONE SEEN
BILIRUBIN URINE: NEGATIVE
Glucose, UA: NEGATIVE mg/dL
KETONES UR: 5 mg/dL — AB
Nitrite: NEGATIVE
PROTEIN: NEGATIVE mg/dL
Specific Gravity, Urine: 1.017 (ref 1.005–1.030)
pH: 5 (ref 5.0–8.0)

## 2017-11-17 LAB — URINE CULTURE

## 2017-11-27 ENCOUNTER — Encounter: Payer: Self-pay | Admitting: Gerontology

## 2017-11-27 ENCOUNTER — Non-Acute Institutional Stay (SKILLED_NURSING_FACILITY): Payer: Medicare Other | Admitting: Gerontology

## 2017-11-27 DIAGNOSIS — M48061 Spinal stenosis, lumbar region without neurogenic claudication: Secondary | ICD-10-CM | POA: Diagnosis not present

## 2017-11-27 DIAGNOSIS — M5136 Other intervertebral disc degeneration, lumbar region: Secondary | ICD-10-CM | POA: Diagnosis not present

## 2017-11-27 DIAGNOSIS — F0280 Dementia in other diseases classified elsewhere without behavioral disturbance: Secondary | ICD-10-CM

## 2017-11-27 DIAGNOSIS — I4891 Unspecified atrial fibrillation: Secondary | ICD-10-CM | POA: Diagnosis not present

## 2017-11-27 DIAGNOSIS — L4050 Arthropathic psoriasis, unspecified: Secondary | ICD-10-CM | POA: Diagnosis not present

## 2017-11-27 DIAGNOSIS — M51369 Other intervertebral disc degeneration, lumbar region without mention of lumbar back pain or lower extremity pain: Secondary | ICD-10-CM

## 2017-11-27 DIAGNOSIS — G629 Polyneuropathy, unspecified: Secondary | ICD-10-CM

## 2017-11-27 DIAGNOSIS — I38 Endocarditis, valve unspecified: Secondary | ICD-10-CM

## 2017-11-27 DIAGNOSIS — G20A1 Parkinson's disease without dyskinesia, without mention of fluctuations: Secondary | ICD-10-CM

## 2017-11-27 DIAGNOSIS — I5020 Unspecified systolic (congestive) heart failure: Secondary | ICD-10-CM | POA: Diagnosis not present

## 2017-11-27 DIAGNOSIS — K5904 Chronic idiopathic constipation: Secondary | ICD-10-CM

## 2017-11-27 DIAGNOSIS — R2689 Other abnormalities of gait and mobility: Secondary | ICD-10-CM

## 2017-11-27 DIAGNOSIS — D649 Anemia, unspecified: Secondary | ICD-10-CM | POA: Diagnosis not present

## 2017-11-27 DIAGNOSIS — D693 Immune thrombocytopenic purpura: Secondary | ICD-10-CM

## 2017-11-27 DIAGNOSIS — L409 Psoriasis, unspecified: Secondary | ICD-10-CM

## 2017-11-27 DIAGNOSIS — F324 Major depressive disorder, single episode, in partial remission: Secondary | ICD-10-CM | POA: Diagnosis not present

## 2017-11-27 DIAGNOSIS — C61 Malignant neoplasm of prostate: Secondary | ICD-10-CM

## 2017-11-27 DIAGNOSIS — E785 Hyperlipidemia, unspecified: Secondary | ICD-10-CM

## 2017-11-27 DIAGNOSIS — F419 Anxiety disorder, unspecified: Secondary | ICD-10-CM

## 2017-11-27 DIAGNOSIS — G20 Parkinson's disease: Secondary | ICD-10-CM | POA: Diagnosis not present

## 2017-11-27 DIAGNOSIS — G4752 REM sleep behavior disorder: Secondary | ICD-10-CM

## 2017-11-29 ENCOUNTER — Encounter
Admission: RE | Admit: 2017-11-29 | Discharge: 2017-11-29 | Disposition: A | Discharge status: Home / Self Care | Payer: Medicare Other | Source: Ambulatory Visit | Attending: Internal Medicine | Admitting: Internal Medicine

## 2017-12-04 NOTE — Progress Notes (Signed)
Location:   [facility] Room Number: 937T Place of Service:  SNF [phone]) Provider:  Toni Arthurs, NP-C  Sparks, Leonie Douglas, MD  Patient Care Team: Idelle Crouch, MD as PCP - General (Internal Medicine)  Extended Emergency Contact Information Primary Emergency Contact: Derl Barrow (Daughter)  [place] Phone: [phone] Relation: None  Code Status:  DNR Goals of care: Advanced Directive information Advanced Directives 11/27/2017  Does Patient Have a Medical Advance Directive? Yes  Type of Advance Directive Out of facility DNR (pink MOST or yellow form)  Does patient want to make changes to medical advance directive? No - Patient declined  Copy of [place] in Chart? -  Pre-existing out of facility DNR order (yellow form or pink MOST form) -     Chief Complaint  Patient presents with  . Medical Management of Chronic Issues    Routine Visit    HPI:  Pt is a 83 y.o. male seen today for medical management of chronic diseases.  Patient has been fairly stable over the past month with chronic diseases.  No falls this past month.  Although, he has had increased confusion and agitation.  He has called 911 several times to report he was being held against his will.  He continues to have intermittent episodes of sexual inappropriateness.  They are less frequent now though.  His tremors wax and wane.  He has a Actuary that is with him the majority of the day and helps him with all his needs.  Patient has been having increased complaints of not feeling well, nonspecific.  He continues on hospice services.  Typically, he is very sweet and always smiling.  Again, having more frequent times of agitation and irritability.  He does have ongoing issues with intermittent constipation.  Denies pain or any other complaints at this time.  Continues with slow, nonspecific decline.  Vital signs stable.   Past Medical History:    Diagnosis Date  . Anemia, unspecified    pernicious  . Anxiety    unspecified  . Arthritis   . Atrial fibrillation ([place])   . CHF (congestive heart failure) ([place]) 01/2006  . Kidney stones   . Nephrolithiasis   . Parkinson's disease ([place])   . Peripheral neuropathy   . Prostate cancer ([place]) 11/2006  . Psoriasis   . Psoriasis, unspecified   . Skin cancer   . Sleep disturbance    REM  . Spinal stenosis    s/p lumbar laminectomy  . Unspecified atrial fibrillation ([place])   . Upper GI bleed    unspecified; history of AVMs  . Valvular heart disease   . Vertigo    Past Surgical History:  Procedure Laterality Date  . CATARACT EXTRACTION    . EYE SURGERY    . INTERAL BLEEDING  10/2011   stomach  . MOHS SURGERY     Facial skin   . PROSTATE SURGERY    . SPINE SURGERY      Allergies  Allergen Reactions  . Other     Strawberries  . Penicillins     Allergies as of 11/27/2017      Reactions   Other    Strawberries   Penicillins       Medication List        Accurate as of 11/27/17 11:59 PM. Always use your most recent med list.          acetaminophen 325 MG tablet Commonly  known as:  TYLENOL Take 650 mg by mouth every 4 (four) hours as needed.   acetaminophen 500 MG tablet Commonly known as:  TYLENOL Take 500 mg by mouth 2 (two) times daily. Maximum of 3000 mg of acetaminophen in 24 hours. Check prn acetaminophen order prior to administration due to limit.   alfuzosin 10 MG 24 hr tablet Commonly known as:  UROXATRAL Take 10 mg by mouth daily.   aspirin 325 MG EC tablet Take 325 mg by mouth daily.   bisacodyl 10 MG suppository Commonly known as:  DULCOLAX Place 10 mg rectally daily as needed for moderate constipation.   buPROPion 300 MG 24 hr tablet Commonly known as:  WELLBUTRIN XL Take 300 mg by mouth daily.   carbidopa-levodopa 50-200 MG tablet Commonly known as:  SINEMET CR Take 1 tablet by mouth 2 (two) times daily.   carbidopa-levodopa 25-100  MG tablet Commonly known as:  SINEMET IR Take 2 tablets by mouth 3 (three) times daily.   citalopram 20 MG tablet Commonly known as:  CELEXA Take 20 mg by mouth at bedtime.   clonazePAM 0.5 MG tablet Commonly known as:  KLONOPIN Take 0.5 mg by mouth at bedtime.   coal tar 0.5 % shampoo Commonly known as:  NEUTROGENA T-GEL Apply 1 application topically 2 (two) times a week. Enough to lather   ENDIT EX Apply liberal amount topically to areas of skin irritation 2 times daily and prn. Ok to leave at bedside.   magnesium hydroxide 400 MG/5ML suspension Commonly known as:  MILK OF MAGNESIA Take 30 mLs by mouth every 4 (four) hours as needed for mild constipation.   Melatonin 3 MG Tabs Take 3 mg by mouth at bedtime.   midodrine 2.5 MG tablet Commonly known as:  PROAMATINE Take 2.5 mg by mouth 3 (three) times daily with meals. Take along with sinemet   morphine 20 MG/ML concentrated solution Commonly known as:  ROXANOL Take 5-10 mg by mouth every hour as needed for severe pain.   NUTRITIONAL SUPPLEMENTS PO Give Magic Cup by mouth 2 times daily with lunch and supper tray.   ondansetron 4 MG tablet Commonly known as:  ZOFRAN Take 4 mg by mouth every 4 (four) hours as needed for nausea or vomiting.   Phenol 0.5 % Liqd Use as directed 2 sprays in the mouth or throat every 2 (two) hours as needed.   predniSONE 5 MG tablet Commonly known as:  DELTASONE Take 5 mg by mouth daily.   QUEtiapine 50 MG tablet Commonly known as:  SEROQUEL Take 50 mg by mouth 2 (two) times daily.   RISA-BID PROBIOTIC Tabs Take 1 tablet by mouth 2 (two) times daily.   sennosides-docusate sodium 8.6-50 MG tablet Commonly known as:  SENOKOT-S Take 2 tablets by mouth 3 (three) times daily.   sorbitol 70 % solution Take 45 mLs by mouth 3 (three) times daily.   sorbitol 70 % solution Take 30 mLs by mouth daily as needed. Give every 2 hours until pt has good BM. Then may change order to prn.     triamcinolone cream 0.1 % Commonly known as:  KENALOG Apply 1 application topically 2 (two) times daily as needed.       Review of Systems  Constitutional: Negative for activity change, appetite change, chills, diaphoresis, fatigue and fever.  HENT: Negative for congestion, dental problem, drooling, ear discharge, ear pain, facial swelling, mouth sores, postnasal drip, sinus pain, sore throat and trouble swallowing.   Eyes:  Negative.   Respiratory: Negative for cough, chest tightness and shortness of breath.   Cardiovascular: Negative for chest pain, palpitations and leg swelling.  Gastrointestinal: Negative for abdominal distention, abdominal pain, diarrhea, nausea and vomiting.  Genitourinary: Negative.   Musculoskeletal: Positive for arthralgias and gait problem.  Skin: Positive for rash (psoriasis).  Neurological: Positive for tremors, speech difficulty and weakness.  Psychiatric/Behavioral: Positive for agitation, dysphoric mood and sleep disturbance.  All other systems reviewed and are negative.   Immunization History  Administered Date(s) Administered  . Influenza, High Dose Seasonal PF 09/22/2017  . Influenza-Unspecified 10/09/2012, 09/28/2016  . PPD Test 10/12/2016  . Pneumococcal-Unspecified 05/13/2014   Pertinent  Health Maintenance Due  Topic Date Due  . PNA vac Low Risk Adult (2 of 2 - PCV13) 05/14/2015  . INFLUENZA VACCINE  Completed   No flowsheet data found. Functional Status Survey:    Vitals:   11/27/17 1021  BP: (!) 181/72  Pulse: (!) 108  Resp: (!) 22  Temp: 98.1 F (36.7 C)  TempSrc: Oral  SpO2: 96%  Weight: 152 lb 9.6 oz (69.2 kg)  Height: _0  (1.727 m)   Body mass index is 23.2 kg/m. Physical Exam  Constitutional: He is oriented to person, place, and time. Vital signs are normal. He appears well-developed and well-nourished. No distress.  HENT:  Head: Normocephalic and atraumatic.  Eyes: Conjunctivae and EOM are normal. Pupils are  equal, round, and reactive to light.  Neck: Normal range of motion. Neck supple. No JVD present. No thyroid mass present.  Cardiovascular: Normal rate, normal heart sounds and intact distal pulses. An irregular rhythm present. Exam reveals no gallop, no distant heart sounds and no friction rub.  No murmur heard. Pulses:      Dorsalis pedis pulses are 2+ on the right side, and 2+ on the left side.  No edema  Pulmonary/Chest: Effort normal and breath sounds normal. No accessory muscle usage. No respiratory distress. He has no decreased breath sounds. He has no wheezes. He has no rhonchi. He has no rales. He exhibits no tenderness.  Abdominal: Soft. He exhibits no distension and no pulsatile midline mass. Bowel sounds are increased. There is no tenderness.  Abdomen firm, but non-tender  Musculoskeletal: He exhibits no edema or tenderness.  Neurological: He is alert and oriented to person, place, and time. He displays tremor. He exhibits abnormal muscle tone. Coordination (Parkinson's) abnormal.  Skin: Skin is warm. No rash noted. He is not diaphoretic. No cyanosis or erythema. There is pallor. Nails show no clubbing.  Patient has psoriasis on multiple locations of the body. Large red patches on the feet and lower legs. No change from previous assessment.  Psychiatric: Thought content normal. His affect is blunt. He is agitated and withdrawn. Cognition and memory are impaired. He expresses inappropriate judgment. He exhibits abnormal recent memory.  Nursing note and vitals reviewed.   Labs reviewed: Recent Labs    06/05/17 0500  NA 137  K 4.0  CL 104  CO2 27  GLUCOSE 88  BUN 20  CREATININE 0.88  CALCIUM 8.7*   Recent Labs    06/05/17 0500  AST 17  ALT <5*  ALKPHOS 78  BILITOT 0.6  PROT 6.0*  ALBUMIN 3.3*   Recent Labs    06/05/17 0500  WBC 6.3  NEUTROABS 3.9  HGB 11.2*  HCT 32.8*  MCV 97.4  PLT 179   Lab Results  Component Value Date   TSH 2.830 06/05/2017   No  results found for: HGBA1C Lab Results  Component Value Date   CHOL 136 11/29/2015   HDL 44 11/29/2015   LDLCALC 80 11/29/2015   TRIG 59 11/29/2015   CHOLHDL 3.1 11/29/2015    Significant Diagnostic Results in last 30 days:  No results found.  Assessment/Plan 1.Parkinson's Disease  Continue clonazepam 0.5 mg po Q HS  Increased Seroquel 50 mg po BID  Discontinue trazodone to 50 mg po Q HS prn  2. Dementia in other diseases classified elsewhere without behavioral disturbance  Stable, slow progression   3. Major depressive disorder, single episode, in partial remission (HCC)  Stable  DC Celexa  Continue sertraline 50 mg p.o. daily  Continue Wellbutrin XL 300 mg po Q Day  4. Atrial fibrillation, unspecified type (HCC)  Stable  Continue Aspirin 325 mg po Q Day  5. Anemia, unspecified type  Stable   Ensure 1 can po Q Day  6. Heart valve disease  Stable   Continue Aspirin 325 mg po Q Day  7. Systolic congestive heart failure, unspecified congestive heart failure chronicity (HCC)  Stable   8. Polyneuropathy (HCC)  Stable   9. Psoriatic arthritis (HCC)  Stable   Continue Tylenol 500 mg po BID  Continue Morphine 20 mg/ml 0.25-0.5 ml po Q 1 hour prn  10. Degeneration of intervertebral disc of lumbar region 11. Spinal stenosis, lumbar region, without neurogenic claudication  Stable   Continue Tylenol 500 mg po BID  Continue Morphine 20 mg/ml 0.25-0.5 ml po Q 1 hour prn  12. Psoriasis  Stable   Continue Triamcinolone 0.1% thin film to areas of psoriasis BID prn  Continue Neutrogena T-Gel (coal tar) [OTC] shampoo; 0.5 %; amt: enough to lather twice weekly  13. Hyperlipidemia, unspecified hyperlipidemia type  Stable   14. Impaired gait and mobility  Assist with ADLs  Use Hoyer lift for transfers from the bed to chair and to the BR  15. Anxiety  Stable   Monitor  Continue Clonazepam - Schedule IV tablet; 0.5 mg; amt: 1  tablet PO Q HS  16. REM sleep behavior disorder  Stable   D/C Trazodone 50 mg po Q HS  17.  Malignant Neoplasm of Prostate  Stable   Continue Alfuzosin tablet extended release 24 hr; 10 mg po Q Day  18. Chronic idiopathic constipation  Continue Sorbitol 70%- 45 mL po TID   Continue Senna S- 2 tablets po TID  Continue Bisacodyl 10 mg PR Q Day prn  Continue Sorbitol 70%- 30 mL po Q 2 hours prn- constipation  19. Idiopathic thrombocytopenia Purpura  Continue Prednisone 5 mg PO Q Day  Continue all medications as listed above (11/27/17)  Family/ staff Communication:   Total Time:  Documentation:  Face to Face:  Family/Phone:   Labs/tests ordered: CBC, met C, TSH, magnesium, vitamin D, vitamin B12  Medication list reviewed and assessed for continued appropriateness.  Vikki Ports, NP-C Geriatrics [facility] [phone], [place] [address] Cell Phone (Mon-Fri 8am-5pm):  [phone] On Call:  [phone] & follow prompts after 5pm & weekends Office Phone:  [phone] Office Fax:  [phone]

## 2017-12-09 ENCOUNTER — Other Ambulatory Visit
Admission: RE | Admit: 2017-12-09 | Discharge: 2017-12-09 | Disposition: A | Discharge status: Home / Self Care | Source: Ambulatory Visit | Attending: Gerontology | Admitting: Gerontology

## 2017-12-09 DIAGNOSIS — G20 Parkinson's disease: Secondary | ICD-10-CM | POA: Diagnosis present

## 2017-12-09 LAB — COMPREHENSIVE METABOLIC PANEL
ALBUMIN: 3.7 g/dL (ref 3.5–5.0)
ANION GAP: 11 (ref 5–15)
AST: 18 U/L (ref 15–41)
Alkaline Phosphatase: 74 U/L (ref 38–126)
BUN: 11 mg/dL (ref 6–20)
CO2: 25 mmol/L (ref 22–32)
Calcium: 8.9 mg/dL (ref 8.9–10.3)
Chloride: 101 mmol/L (ref 101–111)
Creatinine, Ser: 1 mg/dL (ref 0.61–1.24)
Glucose, Bld: 67 mg/dL (ref 65–99)
POTASSIUM: 3.9 mmol/L (ref 3.5–5.1)
Sodium: 137 mmol/L (ref 135–145)
TOTAL PROTEIN: 6.1 g/dL — AB (ref 6.5–8.1)
Total Bilirubin: 0.8 mg/dL (ref 0.3–1.2)

## 2017-12-09 LAB — CBC WITH DIFFERENTIAL/PLATELET
BASOS PCT: 0 %
Basophils Absolute: 0 10*3/uL (ref 0–0.1)
Eosinophils Absolute: 0.2 10*3/uL (ref 0–0.7)
Eosinophils Relative: 4 %
HEMATOCRIT: 37.9 % — AB (ref 40.0–52.0)
HEMOGLOBIN: 13 g/dL (ref 13.0–18.0)
LYMPHS ABS: 1.2 10*3/uL (ref 1.0–3.6)
Lymphocytes Relative: 24 %
MCH: 35.9 pg — ABNORMAL HIGH (ref 26.0–34.0)
MCHC: 34.4 g/dL (ref 32.0–36.0)
MCV: 104.6 fL — ABNORMAL HIGH (ref 80.0–100.0)
MONOS PCT: 12 %
Monocytes Absolute: 0.6 10*3/uL (ref 0.2–1.0)
NEUTROS ABS: 3 10*3/uL (ref 1.4–6.5)
NEUTROS PCT: 60 %
Platelets: 203 10*3/uL (ref 150–440)
RBC: 3.62 MIL/uL — AB (ref 4.40–5.90)
RDW: 13.3 % (ref 11.5–14.5)
WBC: 5.1 10*3/uL (ref 3.8–10.6)

## 2017-12-09 LAB — VITAMIN B12: VITAMIN B 12: 340 pg/mL (ref 180–914)

## 2017-12-09 LAB — TSH: TSH: 3.787 u[IU]/mL (ref 0.350–4.500)

## 2017-12-09 LAB — MAGNESIUM: MAGNESIUM: 2 mg/dL (ref 1.7–2.4)

## 2017-12-10 LAB — VITAMIN D 25 HYDROXY (VIT D DEFICIENCY, FRACTURES): VIT D 25 HYDROXY: 22.6 ng/mL — AB (ref 30.0–100.0)

## 2017-12-14 ENCOUNTER — Other Ambulatory Visit
Admission: RE | Admit: 2017-12-14 | Discharge: 2017-12-14 | Disposition: A | Discharge status: Home / Self Care | Source: Skilled Nursing Facility | Attending: Internal Medicine | Admitting: Internal Medicine

## 2017-12-14 DIAGNOSIS — G20 Parkinson's disease: Secondary | ICD-10-CM | POA: Insufficient documentation

## 2017-12-14 LAB — URINALYSIS, COMPLETE (UACMP) WITH MICROSCOPIC
Bacteria, UA: NONE SEEN
Bilirubin Urine: NEGATIVE
GLUCOSE, UA: NEGATIVE mg/dL
Ketones, ur: 5 mg/dL — AB
LEUKOCYTES UA: NEGATIVE
NITRITE: NEGATIVE
PH: 6 (ref 5.0–8.0)
Protein, ur: NEGATIVE mg/dL
SPECIFIC GRAVITY, URINE: 1.011 (ref 1.005–1.030)
Squamous Epithelial / LPF: NONE SEEN

## 2017-12-16 LAB — URINE CULTURE

## 2017-12-19 ENCOUNTER — Other Ambulatory Visit
Admission: RE | Admit: 2017-12-19 | Discharge: 2017-12-19 | Disposition: A | Discharge status: Home / Self Care | Source: Ambulatory Visit | Attending: Internal Medicine | Admitting: Internal Medicine

## 2017-12-19 DIAGNOSIS — R410 Disorientation, unspecified: Secondary | ICD-10-CM | POA: Diagnosis present

## 2017-12-19 LAB — URINALYSIS, COMPLETE (UACMP) WITH MICROSCOPIC
BACTERIA UA: NONE SEEN
Bilirubin Urine: NEGATIVE
Glucose, UA: NEGATIVE mg/dL
Ketones, ur: NEGATIVE mg/dL
Leukocytes, UA: NEGATIVE
Nitrite: NEGATIVE
PROTEIN: NEGATIVE mg/dL
SQUAMOUS EPITHELIAL / LPF: NONE SEEN
Specific Gravity, Urine: 1.004 — ABNORMAL LOW (ref 1.005–1.030)
pH: 7 (ref 5.0–8.0)

## 2017-12-20 LAB — URINE CULTURE

## 2017-12-26 ENCOUNTER — Encounter: Payer: Self-pay | Admitting: Gerontology

## 2017-12-26 ENCOUNTER — Other Ambulatory Visit
Admission: RE | Admit: 2017-12-26 | Discharge: 2017-12-26 | Disposition: A | Discharge status: Home / Self Care | Source: Skilled Nursing Facility | Attending: Gerontology | Admitting: Gerontology

## 2017-12-26 ENCOUNTER — Non-Acute Institutional Stay (SKILLED_NURSING_FACILITY): Payer: Medicare Other | Admitting: Gerontology

## 2017-12-26 DIAGNOSIS — G20 Parkinson's disease: Secondary | ICD-10-CM | POA: Insufficient documentation

## 2017-12-26 DIAGNOSIS — I5020 Unspecified systolic (congestive) heart failure: Secondary | ICD-10-CM

## 2017-12-26 DIAGNOSIS — I38 Endocarditis, valve unspecified: Secondary | ICD-10-CM | POA: Diagnosis not present

## 2017-12-26 DIAGNOSIS — I4891 Unspecified atrial fibrillation: Secondary | ICD-10-CM | POA: Diagnosis not present

## 2017-12-27 LAB — URINALYSIS, COMPLETE (UACMP) WITH MICROSCOPIC
BACTERIA UA: NONE SEEN
BILIRUBIN URINE: NEGATIVE
Glucose, UA: NEGATIVE mg/dL
KETONES UR: 5 mg/dL — AB
NITRITE: NEGATIVE
PROTEIN: NEGATIVE mg/dL
Specific Gravity, Urine: 1.006 (ref 1.005–1.030)
pH: 6 (ref 5.0–8.0)

## 2017-12-28 LAB — URINE CULTURE: CULTURE: NO GROWTH

## 2017-12-30 ENCOUNTER — Encounter
Admission: RE | Admit: 2017-12-30 | Discharge: 2017-12-30 | Disposition: A | Discharge status: Home / Self Care | Source: Ambulatory Visit | Attending: Internal Medicine | Admitting: Internal Medicine

## 2018-01-18 NOTE — Progress Notes (Signed)
[facility] Room Number: 924Q Place of Service:  SNF (31) Provider:  Toni Arthurs, NP-C  Sparks, Leonie Douglas, MD  Patient Care Team: Idelle Crouch, MD as PCP - General (Internal Medicine)  Extended Emergency Contact Information Primary Emergency Contact: Derl Barrow (Daughter)  [place] Phone: [phone] Relation: None  Code Status:  DNR Goals of care: Advanced Directive information Advanced Directives 12/26/2017  Does Patient Have a Medical Advance Directive? Yes  Type of Advance Directive Out of facility DNR (pink MOST or yellow form)  Does patient want to make changes to medical advance directive? No - Patient declined  Copy of [place] in Chart? -  Pre-existing out of facility DNR order (yellow form or pink MOST form) -     Chief Complaint  Patient presents with  . Medical Management of Chronic Issues    Routine Visit    HPI:  Pt is a 84 y.o. male seen today for medical management of chronic diseases.    Heart valve disease Stable. No c/o chest pain or shortness of breath  Congestive heart failure (HCC) Stable. Mild intermittent BLE edema. Pt sits in chair with legs in dependent position most of the day. TED hose in place. No medicinal treatment needed at this time.   Atrial fibrillation (HCC) Stable. No c/o chest pain or shortness of breath. Heart rate consistently below 100 without medication.     Past Medical History:  Diagnosis Date  . Anemia, unspecified    pernicious  . Anxiety    unspecified  . Arthritis   . Atrial fibrillation ([place])   . CHF (congestive heart failure) ([place]) 01/2006  . Kidney stones   . Nephrolithiasis   . Parkinson's disease ([place])   . Peripheral neuropathy   . Prostate cancer ([place]) 11/2006  . Psoriasis   . Psoriasis, unspecified   . Skin cancer   . Sleep disturbance    REM  . Spinal stenosis    s/p lumbar laminectomy  . Unspecified atrial fibrillation ([place])    . Upper GI bleed    unspecified; history of AVMs  . Valvular heart disease   . Vertigo    Past Surgical History:  Procedure Laterality Date  . CATARACT EXTRACTION    . EYE SURGERY    . INTERAL BLEEDING  10/2011   stomach  . MOHS SURGERY     Facial skin   . PROSTATE SURGERY    . SPINE SURGERY      Allergies  Allergen Reactions  . Other     Strawberries  . Penicillins     Allergies as of 12/26/2017      Reactions   Other    Strawberries   Penicillins       Medication List        Accurate as of 12/26/17 11:59 PM. Always use your most recent med list.          acetaminophen 325 MG tablet Commonly known as:  TYLENOL Take 650 mg by mouth every 4 (four) hours as needed.   acetaminophen 500 MG tablet Commonly known as:  TYLENOL Take 500 mg by mouth 2 (two) times daily. Maximum of 3000 mg of acetaminophen in 24 hours. Check prn acetaminophen order prior to administration due to limit.   alfuzosin 10 MG 24 hr tablet Commonly known as:  UROXATRAL Take 10 mg by mouth daily.   aspirin 325 MG EC tablet Take 325 mg by mouth daily.  bisacodyl 10 MG suppository Commonly known as:  DULCOLAX Place 10 mg rectally daily as needed for moderate constipation.   buPROPion 150 MG 24 hr tablet Commonly known as:  WELLBUTRIN XL Take 150 mg by mouth daily.   carbidopa-levodopa 50-200 MG tablet Commonly known as:  SINEMET CR Take 1 tablet by mouth 2 (two) times daily.   carbidopa-levodopa 25-100 MG tablet Commonly known as:  SINEMET IR Take 2 tablets by mouth 3 (three) times daily.   clonazePAM 0.5 MG tablet Commonly known as:  KLONOPIN Take 0.5 mg by mouth at bedtime.   coal tar 0.5 % shampoo Commonly known as:  NEUTROGENA T-GEL Apply 1 application topically 2 (two) times a week. Enough to lather   cyanocobalamin 1000 MCG tablet Take 1,000 mcg by mouth daily.   ENDIT EX Apply liberal amount topically to areas of skin irritation 2 times daily and prn. Ok to  leave at bedside.   magnesium hydroxide 400 MG/5ML suspension Commonly known as:  MILK OF MAGNESIA Take 30 mLs by mouth every 4 (four) hours as needed for mild constipation.   Melatonin 3 MG Tabs Take 3 mg by mouth at bedtime.   midodrine 2.5 MG tablet Commonly known as:  PROAMATINE Take 2.5 mg by mouth 3 (three) times daily with meals. Take along with sinemet   morphine 20 MG/ML concentrated solution Commonly known as:  ROXANOL Take 5-10 mg by mouth every hour as needed for severe pain.   NUTRITIONAL SUPPLEMENTS PO Give Magic Cup by mouth 2 times daily with lunch and supper tray.   ondansetron 4 MG tablet Commonly known as:  ZOFRAN Take 4 mg by mouth every 4 (four) hours as needed for nausea or vomiting.   Phenol 0.5 % Liqd Use as directed 2 sprays in the mouth or throat every 2 (two) hours as needed.   predniSONE 5 MG tablet Commonly known as:  DELTASONE Take 5 mg by mouth daily.   QUEtiapine 50 MG tablet Commonly known as:  SEROQUEL Take 50 mg by mouth 2 (two) times daily.   RISA-BID PROBIOTIC Tabs Take 1 tablet by mouth 2 (two) times daily.   sennosides-docusate sodium 8.6-50 MG tablet Commonly known as:  SENOKOT-S Take 2 tablets by mouth 3 (three) times daily.   sertraline 50 MG tablet Commonly known as:  ZOLOFT Take 50 mg by mouth daily.   sorbitol 70 % solution Take 45 mLs by mouth 3 (three) times daily.   sorbitol 70 % solution Take 30 mLs by mouth daily as needed. Give every 2 hours until pt has good BM. Then may change order to prn.   triamcinolone cream 0.1 % Commonly known as:  KENALOG Apply 1 application topically 2 (two) times daily as needed.       Review of Systems  Constitutional: Negative for activity change, appetite change, chills, diaphoresis and fever.  HENT: Negative for congestion, mouth sores, nosebleeds, postnasal drip, sneezing, sore throat, trouble swallowing and voice change.   Respiratory: Negative for apnea, cough, choking,  chest tightness, shortness of breath and wheezing.   Cardiovascular: Negative for chest pain, palpitations and leg swelling.  Gastrointestinal: Negative for abdominal distention, abdominal pain, constipation, diarrhea and nausea.  Genitourinary: Negative for difficulty urinating, dysuria, frequency and urgency.  Musculoskeletal: Positive for arthralgias (typical arthritis) and gait problem. Negative for back pain and myalgias.  Skin: Negative for color change, pallor, rash and wound.  Neurological: Positive for tremors and weakness. Negative for dizziness, syncope, speech difficulty, numbness and  headaches.  Psychiatric/Behavioral: Negative for agitation and behavioral problems.  All other systems reviewed and are negative.   Immunization History  Administered Date(s) Administered  . Influenza, High Dose Seasonal PF 09/22/2017  . Influenza-Unspecified 10/09/2012, 09/28/2016  . PPD Test 10/12/2016  . Pneumococcal-Unspecified 05/13/2014   Pertinent  Health Maintenance Due  Topic Date Due  . PNA vac Low Risk Adult (2 of 2 - PCV13) 05/14/2015  . INFLUENZA VACCINE  Completed   No flowsheet data found. Functional Status Survey:    Vitals:   12/26/17 1543  BP: 120/70  Pulse: 78  Resp: 20  Temp: 98.2 F (36.8 C)  TempSrc: Oral  SpO2: 96%  Weight: 152 lb 9.6 oz (69.2 kg)  Height: 5\' 8"  (1.727 m)   Body mass index is 23.2 kg/m. Physical Exam  Constitutional: He is oriented to person, place, and time. Vital signs are normal. He appears well-developed and well-nourished. He is active and cooperative. He does not appear ill. No distress.  HENT:  Head: Normocephalic and atraumatic.  Mouth/Throat: Uvula is midline, oropharynx is clear and moist and mucous membranes are normal. Mucous membranes are not pale, not dry and not cyanotic.  Eyes: Conjunctivae, EOM and lids are normal. Pupils are equal, round, and reactive to light.  Neck: Trachea normal, normal range of motion and full  passive range of motion without pain. Neck supple. No JVD present. No tracheal deviation, no edema and no erythema present. No thyromegaly present.  Cardiovascular: Normal rate, regular rhythm, normal heart sounds, intact distal pulses and normal pulses. Exam reveals no gallop, no distant heart sounds and no friction rub.  No murmur heard. Pulses:      Dorsalis pedis pulses are 2+ on the right side, and 2+ on the left side.  No edema  Pulmonary/Chest: Effort normal and breath sounds normal. No accessory muscle usage. No respiratory distress. He has no decreased breath sounds. He has no wheezes. He has no rhonchi. He has no rales. He exhibits no tenderness.  Abdominal: Soft. Normal appearance and bowel sounds are normal. He exhibits no distension and no ascites. There is no tenderness.  Musculoskeletal: Normal range of motion. He exhibits no edema or tenderness.  Expected osteoarthritis, stiffness; Bilateral Calves soft, supple. Negative Homan's Sign. B- pedal pulses equal; generalized weakness; mobile in wheelchair  Neurological: He is alert and oriented to person, place, and time. He has normal strength. He displays atrophy and tremor. A cranial nerve deficit and sensory deficit is present. He exhibits abnormal muscle tone. Coordination and gait abnormal.  Parkinsons  Skin: Skin is warm, dry and intact. He is not diaphoretic. No cyanosis. No pallor. Nails show no clubbing.  Psychiatric: His speech is normal and behavior is normal. Judgment and thought content normal. Cognition and memory are impaired. He exhibits a depressed mood (at times). He exhibits abnormal recent memory.  Nursing note and vitals reviewed.   Labs reviewed: Recent Labs    06/05/17 0500 12/09/17 0500  NA 137 137  K 4.0 3.9  CL 104 101  CO2 27 25  GLUCOSE 88 67  BUN 20 11  CREATININE 0.88 1.00  CALCIUM 8.7* 8.9  MG  --  2.0   Recent Labs    06/05/17 0500 12/09/17 0500  AST 17 18  ALT <5* <5*  ALKPHOS 78 74    BILITOT 0.6 0.8  PROT 6.0* 6.1*  ALBUMIN 3.3* 3.7   Recent Labs    06/05/17 0500 12/09/17 0500  WBC 6.3 5.1  NEUTROABS 3.9 3.0  HGB 11.2* 13.0  HCT 32.8* 37.9*  MCV 97.4 104.6*  PLT 179 203   Lab Results  Component Value Date   TSH 3.787 12/09/2017   No results found for: HGBA1C Lab Results  Component Value Date   CHOL 136 11/29/2015   HDL 44 11/29/2015   LDLCALC 80 11/29/2015   TRIG 59 11/29/2015   CHOLHDL 3.1 11/29/2015    Significant Diagnostic Results in last 30 days:  No results found.  Assessment/Plan Viraj was seen today for medical management of chronic issues.  Diagnoses and all orders for this visit:  Heart valve disease  Systolic congestive heart failure, unspecified HF chronicity (HCC)  Atrial fibrillation, unspecified type ([place])   above listed conditions stable  Continue current medication regimen  TEDs daily for edema control  Elevate legs when at rest  Family/ staff Communication:   Total Time:  Documentation:  Face to Face:  Family/Phone:   Labs/tests ordered:  Not due  Medication list reviewed and assessed for continued appropriateness. Monthly medication orders reviewed and signed.  Vikki Ports, NP-C Geriatrics [facility] [phone], [place] [address] Cell Phone (Mon-Fri 8am-5pm):  [phone] On Call:  [phone] & follow prompts after 5pm & weekends Office Phone:  [phone] Office Fax:  [phone]

## 2018-01-18 NOTE — Assessment & Plan Note (Signed)
Stable. Mild intermittent BLE edema. Pt sits in chair with legs in dependent position most of the day. TED hose in place. No medicinal treatment needed at this time.

## 2018-01-18 NOTE — Assessment & Plan Note (Signed)
Stable. No c/o chest pain or shortness of breath. Heart rate consistently below 100 without medication.

## 2018-01-18 NOTE — Assessment & Plan Note (Signed)
Stable. No c/o chest pain or shortness of breath

## 2018-01-29 ENCOUNTER — Non-Acute Institutional Stay (SKILLED_NURSING_FACILITY): Payer: Medicare Other | Admitting: Gerontology

## 2018-01-29 ENCOUNTER — Encounter: Payer: Self-pay | Admitting: Gerontology

## 2018-01-29 DIAGNOSIS — G4752 REM sleep behavior disorder: Secondary | ICD-10-CM

## 2018-01-29 DIAGNOSIS — F0280 Dementia in other diseases classified elsewhere without behavioral disturbance: Secondary | ICD-10-CM

## 2018-01-29 DIAGNOSIS — G629 Polyneuropathy, unspecified: Secondary | ICD-10-CM

## 2018-01-30 ENCOUNTER — Encounter
Admission: RE | Admit: 2018-01-30 | Discharge: 2018-01-30 | Disposition: A | Discharge status: Home / Self Care | Source: Ambulatory Visit | Attending: Internal Medicine | Admitting: Internal Medicine

## 2018-02-26 ENCOUNTER — Encounter: Payer: Self-pay | Admitting: Gerontology

## 2018-02-26 ENCOUNTER — Non-Acute Institutional Stay (SKILLED_NURSING_FACILITY): Payer: Medicare Other | Admitting: Gerontology

## 2018-02-26 DIAGNOSIS — I639 Cerebral infarction, unspecified: Secondary | ICD-10-CM | POA: Diagnosis not present

## 2018-02-26 DIAGNOSIS — G20 Parkinson's disease: Secondary | ICD-10-CM | POA: Diagnosis not present

## 2018-02-26 DIAGNOSIS — K5904 Chronic idiopathic constipation: Secondary | ICD-10-CM

## 2018-02-27 ENCOUNTER — Encounter
Admission: RE | Admit: 2018-02-27 | Discharge: 2018-02-27 | Disposition: A | Discharge status: Home / Self Care | Payer: BLUE CROSS/BLUE SHIELD | Source: Ambulatory Visit | Attending: Internal Medicine | Admitting: Internal Medicine

## 2018-03-02 NOTE — Assessment & Plan Note (Signed)
Stable. Pt being followed by neurologist for symptom management. Symptoms controlled on Sinemet CR and IR. Neurology attempted use of Exelon at last visit. Pt had increased agitation, restlessness/ irritability. Medication DC'ed. Added to allergy list.

## 2018-03-02 NOTE — Assessment & Plan Note (Signed)
Stable. Symptoms controlled with Aspercreme, Roxanol and Tylenol. No c/o worsening symptoms as of late.

## 2018-03-02 NOTE — Assessment & Plan Note (Signed)
Variable. Pt c/o not sleeping well at night. Staff reports pt occasionally roams the halls at night in the wheelchair. On Clonazepam 0.5 mg po Q HS and melatonin 3 mg.

## 2018-03-02 NOTE — Progress Notes (Signed)
Location:    Nursing Home Room Number: 425 Place of Service:  SNF (31) Provider:  Toni Arthurs, NP-C  Kirk Ruths, MD  Patient Care Team: Kirk Ruths, MD as PCP - General (Internal Medicine) Toni Arthurs, NP as Nurse Practitioner (Family Medicine)  Extended Emergency Contact Information Primary Emergency Contact: Derl Barrow (Daughter)  [place] Phone: [phone] Relation: None  Code Status:  DNR Goals of care: Advanced Directive information Advanced Directives 02/26/2018  Does Patient Have a Medical Advance Directive? Yes  Type of Advance Directive Out of facility DNR (pink MOST or yellow form)  Does patient want to make changes to medical advance directive? No - Patient declined  Copy of [place] in Chart? -  Pre-existing out of facility DNR order (yellow form or pink MOST form) Yellow form placed in chart (order not valid for inpatient use)     Chief Complaint  Patient presents with  . Medical Management of Chronic Issues    Routine Visit    HPI:  Pt is a 84 y.o. male seen today for medical management of chronic diseases.    Cerebral infarction ([place]) Stable. Minimal distinguishable deficits. Generalized weakness. No longer on anticoagulant therapy. Under Hospice care.  Parkinson's disease ([place]) Stable. Pt being followed by neurologist for symptom management. Symptoms controlled on Sinemet CR and IR. Neurology attempted use of Exelon at last visit. Pt had increased agitation, restlessness/ irritability. Medication DC'ed. Added to allergy list.   Chronic idiopathic constipation Stable. Symptoms managed with Risa-Bid Probiotics, Senna S- 2 tablets TID, Sorbitol 70% solution 30 mL TID scheduled and Q 2 hours prn, Milk of Mag prn, Bisacodyl suppository prn.     Past Medical History:  Diagnosis Date  . Anemia, unspecified    pernicious  . Anxiety    unspecified  . Arthritis   . Atrial fibrillation  ([place])   . CHF (congestive heart failure) ([place]) 01/2006  . Kidney stones   . Nephrolithiasis   . Parkinson's disease ([place])   . Peripheral neuropathy   . Prostate cancer ([place]) 11/2006  . Psoriasis   . Psoriasis, unspecified   . Skin cancer   . Sleep disturbance    REM  . Spinal stenosis    s/p lumbar laminectomy  . Unspecified atrial fibrillation ([place])   . Upper GI bleed    unspecified; history of AVMs  . Valvular heart disease   . Vertigo    Past Surgical History:  Procedure Laterality Date  . CATARACT EXTRACTION    . EYE SURGERY    . INTERAL BLEEDING  10/2011   stomach  . MOHS SURGERY     Facial skin   . PROSTATE SURGERY    . SPINE SURGERY      Allergies  Allergen Reactions  . Exelon [Rivastigmine Tartrate]     Increased agitation/irritability  . Other     Strawberries  . Penicillins     Allergies as of 02/26/2018      Reactions   Other    Strawberries   Penicillins       Medication List        Accurate as of 02/26/18 11:59 PM. Always use your most recent med list.          acetaminophen 325 MG tablet Commonly known as:  TYLENOL Take 650 mg by mouth every 4 (four) hours as needed. for pain/ increased temp. May be administered orally, per G-tube if needed or rectally if unable to  swallow (separate order). Maximum of 3000 mg Acetaminophen in 24 hours.   acetaminophen 500 MG tablet Commonly known as:  TYLENOL Take 500 mg by mouth 2 (two) times daily. Maximum of 3000 mg of acetaminophen in 24 hours. Check prn acetaminophen order prior to administration due to limit.   alfuzosin 10 MG 24 hr tablet Commonly known as:  UROXATRAL Take 10 mg by mouth daily.   aspirin 325 MG EC tablet Take 325 mg by mouth daily.   bisacodyl 10 MG suppository Commonly known as:  DULCOLAX Place 10 mg rectally daily as needed for moderate constipation.   buPROPion 150 MG 24 hr tablet Commonly known as:  WELLBUTRIN XL Take 150 mg by mouth daily.   carbidopa-levodopa  50-200 MG tablet Commonly known as:  SINEMET CR Take 1 tablet by mouth 2 (two) times daily.   carbidopa-levodopa 25-100 MG tablet Commonly known as:  SINEMET IR Take 2 tablets by mouth 3 (three) times daily.   clonazePAM 1 MG tablet Commonly known as:  KLONOPIN Take 1 mg by mouth at bedtime.   coal tar 0.5 % shampoo Commonly known as:  NEUTROGENA T-GEL Apply 1 application topically 2 (two) times a week. Enough to lather   cyanocobalamin 1000 MCG tablet Take 1,000 mcg by mouth daily.   ENDIT EX Apply liberal amount topically to areas of skin irritation 2 times daily and prn. Ok to leave at bedside.   hydrocortisone cream 1 % Apply thin film topically  to clean, dry hemorrhoids 4 times a day as needed   magnesium hydroxide 400 MG/5ML suspension Commonly known as:  MILK OF MAGNESIA Take 30 mLs by mouth every 4 (four) hours as needed for mild constipation.   Melatonin 3 MG Tabs Take 3 mg by mouth at bedtime.   midodrine 2.5 MG tablet Commonly known as:  PROAMATINE Take 2.5 mg by mouth 3 (three) times daily with meals. Take along with sinemet   morphine 20 MG/ML concentrated solution Commonly known as:  ROXANOL Take 5-10 mg by mouth every hour as needed for severe pain.   NUTRITIONAL SUPPLEMENTS PO Give Magic Cup by mouth 2 times daily with lunch and supper tray.   ondansetron 4 MG tablet Commonly known as:  ZOFRAN Take 4 mg by mouth every 4 (four) hours as needed for nausea or vomiting.   Phenol 0.5 % Liqd Use as directed 2 sprays in the mouth or throat every 2 (two) hours as needed.   predniSONE 5 MG tablet Commonly known as:  DELTASONE Take 5 mg by mouth daily.   QUEtiapine 50 MG tablet Commonly known as:  SEROQUEL Take 50 mg by mouth 2 (two) times daily.   RISA-BID PROBIOTIC Tabs Take 1 tablet by mouth 2 (two) times daily.   sennosides-docusate sodium 8.6-50 MG tablet Commonly known as:  SENOKOT-S Take 2 tablets by mouth 3 (three) times daily.     sertraline 50 MG tablet Commonly known as:  ZOLOFT Take 75 mg by mouth daily. 1 and 1/2 tablet   sorbitol 70 % solution Take 45 mLs by mouth 3 (three) times daily.   sorbitol 70 % solution Take 30 mLs by mouth daily as needed. Give every 2 hours until pt has good BM. Then may change order to prn.   triamcinolone cream 0.1 % Commonly known as:  KENALOG Apply 1 application topically 2 (two) times daily as needed.   trolamine salicylate 10 % cream Commonly known as:  ASPERCREME Apply thin film to the neck  3 times day for pain until resolved. Then may change to prn       Review of Systems  Unable to perform ROS: Dementia  Constitutional: Negative for activity change, appetite change, chills, diaphoresis and fever.  HENT: Negative for congestion, mouth sores, nosebleeds, postnasal drip, sneezing, sore throat, trouble swallowing and voice change.   Respiratory: Negative for apnea, cough, choking, chest tightness, shortness of breath and wheezing.   Cardiovascular: Negative for chest pain, palpitations and leg swelling.  Gastrointestinal: Negative for abdominal distention, abdominal pain, constipation, diarrhea and nausea.  Genitourinary: Negative for difficulty urinating, dysuria, frequency and urgency.  Musculoskeletal: Negative for back pain, gait problem and myalgias. Arthralgias: typical arthritis.  Skin: Negative for color change, pallor, rash and wound.  Neurological: Positive for tremors, speech difficulty and weakness. Negative for dizziness, syncope, numbness and headaches.  Psychiatric/Behavioral: Positive for dysphoric mood. Negative for agitation and behavioral problems.  All other systems reviewed and are negative.   Immunization History  Administered Date(s) Administered  . Influenza, High Dose Seasonal PF 09/22/2017  . Influenza-Unspecified 10/09/2012, 09/28/2016  . PPD Test 10/12/2016  . Pneumococcal-Unspecified 05/13/2014   Pertinent  Health Maintenance Due   Topic Date Due  . PNA vac Low Risk Adult (2 of 2 - PCV13) 05/14/2015  . INFLUENZA VACCINE  Completed   No flowsheet data found. Functional Status Survey:    Vitals:   02/26/18 1458  BP: 120/70  Pulse: 78  Resp: 20  Temp: 98.2 F (36.8 C)  TempSrc: Oral  SpO2: 96%  Weight: 150 lb 8 oz (68.3 kg)  Height: 5\' 8"  (1.727 m)   Body mass index is 22.88 kg/m. Physical Exam  Constitutional: He is oriented to person, place, and time. Vital signs are normal. He appears well-developed and well-nourished. He is active and cooperative. He does not appear ill. No distress.  HENT:  Head: Normocephalic and atraumatic.  Mouth/Throat: Uvula is midline, oropharynx is clear and moist and mucous membranes are normal. Mucous membranes are not pale, not dry and not cyanotic.  Eyes: Conjunctivae, EOM and lids are normal. Pupils are equal, round, and reactive to light.  Neck: Trachea normal, normal range of motion and full passive range of motion without pain. Neck supple. No JVD present. No tracheal deviation, no edema and no erythema present. No thyromegaly present.  Cardiovascular: Normal rate, regular rhythm, normal heart sounds, intact distal pulses and normal pulses. Exam reveals no gallop, no distant heart sounds and no friction rub.  No murmur heard. Pulses:      Dorsalis pedis pulses are 2+ on the right side, and 2+ on the left side.  No edema  Pulmonary/Chest: Effort normal and breath sounds normal. No accessory muscle usage. No respiratory distress. He has no decreased breath sounds. He has no wheezes. He has no rhonchi. He has no rales. He exhibits no tenderness.  Abdominal: Soft. Normal appearance and bowel sounds are normal. He exhibits no distension and no ascites. There is no tenderness.  Musculoskeletal: Normal range of motion. He exhibits no edema or tenderness.  Expected osteoarthritis, stiffness; Bilateral Calves soft, supple. Negative Homan's Sign. B- pedal pulses equal; generalized  weakness  Neurological: He is alert and oriented to person, place, and time. He has normal strength. He displays atrophy and tremor. A cranial nerve deficit and sensory deficit is present. He exhibits abnormal muscle tone. Coordination and gait abnormal.  Parkinson's Disease  Skin: Skin is warm, dry and intact. He is not diaphoretic. No cyanosis. No pallor.  Nails show no clubbing.  Psychiatric: Thought content normal. His affect is labile. His speech is delayed. He is withdrawn. Cognition and memory are impaired. He expresses impulsivity. He exhibits abnormal recent memory.  Nursing note and vitals reviewed.   Labs reviewed: Recent Labs    06/05/17 0500 12/09/17 0500  NA 137 137  K 4.0 3.9  CL 104 101  CO2 27 25  GLUCOSE 88 67  BUN 20 11  CREATININE 0.88 1.00  CALCIUM 8.7* 8.9  MG  --  2.0   Recent Labs    06/05/17 0500 12/09/17 0500  AST 17 18  ALT <5* <5*  ALKPHOS 78 74  BILITOT 0.6 0.8  PROT 6.0* 6.1*  ALBUMIN 3.3* 3.7   Recent Labs    06/05/17 0500 12/09/17 0500  WBC 6.3 5.1  NEUTROABS 3.9 3.0  HGB 11.2* 13.0  HCT 32.8* 37.9*  MCV 97.4 104.6*  PLT 179 203   Lab Results  Component Value Date   TSH 3.787 12/09/2017   No results found for: HGBA1C Lab Results  Component Value Date   CHOL 136 11/29/2015   HDL 44 11/29/2015   LDLCALC 80 11/29/2015   TRIG 59 11/29/2015   CHOLHDL 3.1 11/29/2015    Significant Diagnostic Results in last 30 days:  No results found.  Assessment/Plan Nilson was seen today for medical management of chronic issues.  Diagnoses and all orders for this visit:  Chronic idiopathic constipation  Parkinson's disease ([place])  Cerebral infarction, unspecified mechanism ([place])   Continue medication regimen as listed above  Follow up with Neurology as instructed for management of Parkinson's symptoms  Monitor for increased weakness or difficulty with ADL's requiring more assistance  Document and monitor BMs  Encourage po  fluid intake  Continue care under Hospice services  Family/ staff Communication:   Total Time:  Documentation:  Face to Face:  Family/Phone:   Labs/tests ordered:    Medication list reviewed and assessed for continued appropriateness. Monthly medication orders reviewed and signed.  Vikki Ports, NP-C Geriatrics [facility] [phone], [place] [address] Cell Phone (Mon-Fri 8am-5pm):  [phone] On Call:  [phone] & follow prompts after 5pm & weekends Office Phone:  [phone] Office Fax:  [phone]

## 2018-03-02 NOTE — Assessment & Plan Note (Signed)
Stable. Minimal distinguishable deficits. Generalized weakness. No longer on anticoagulant therapy. Under Hospice care.

## 2018-03-02 NOTE — Assessment & Plan Note (Signed)
Stable. Symptoms managed with Risa-Bid Probiotics, Senna S- 2 tablets TID, Sorbitol 70% solution 30 mL TID scheduled and Q 2 hours prn, Milk of Mag prn, Bisacodyl suppository prn.

## 2018-03-02 NOTE — Progress Notes (Signed)
Location:    Nursing Home Room Number: 742V Place of Service:  SNF (31) Provider:  Toni Arthurs, NP-C  [place], Leonie Douglas, MD  Patient Care Team: Idelle Crouch, MD as PCP - General (Internal Medicine)  Extended Emergency Contact Information Primary Emergency Contact: Derl Barrow (Daughter)  [place] Phone: [phone] Relation: None  Code Status:  DNR Goals of care: Advanced Directive information Advanced Directives 02/26/2018  Does Patient Have a Medical Advance Directive? Yes  Type of Advance Directive Out of facility DNR (pink MOST or yellow form)  Does patient want to make changes to medical advance directive? No - Patient declined  Copy of [place] in Chart? -  Pre-existing out of facility DNR order (yellow form or pink MOST form) Yellow form placed in chart (order not valid for inpatient use)     Chief Complaint  Patient presents with  . Medical Management of Chronic Issues    Routine Visit    HPI:  Pt is a 84 y.o. male seen today for medical management of chronic diseases.    Polyneuropathy (HCC) Stable. Symptoms controlled with Aspercreme, Roxanol and Tylenol. No c/o worsening symptoms as of late.   Dementia in other diseases classified elsewhere without behavioral disturbance Progressive. Pt upset about increasing forgetfulness/ worsening cognition, losing "train of thought" easily. No outbursts or refusal of care. No known hallucinations. Symptoms controlled with Seroquel 50 mg po BID.  REM sleep behavior disorder Variable. Pt c/o not sleeping well at night. Staff reports pt occasionally roams the halls at night in the wheelchair. On Clonazepam 0.5 mg po Q HS and melatonin 3 mg.     Past Medical History:  Diagnosis Date  . Anemia, unspecified    pernicious  . Anxiety    unspecified  . Arthritis   . Atrial fibrillation ([place])   . CHF (congestive heart failure) ([place]) 01/2006  . Kidney stones   .  Nephrolithiasis   . Parkinson's disease ([place])   . Peripheral neuropathy   . Prostate cancer ([place]) 11/2006  . Psoriasis   . Psoriasis, unspecified   . Skin cancer   . Sleep disturbance    REM  . Spinal stenosis    s/p lumbar laminectomy  . Unspecified atrial fibrillation ([place])   . Upper GI bleed    unspecified; history of AVMs  . Valvular heart disease   . Vertigo    Past Surgical History:  Procedure Laterality Date  . CATARACT EXTRACTION    . EYE SURGERY    . INTERAL BLEEDING  10/2011   stomach  . MOHS SURGERY     Facial skin   . PROSTATE SURGERY    . SPINE SURGERY      Allergies  Allergen Reactions  . Other     Strawberries  . Penicillins     Allergies as of 01/29/2018      Reactions   Other    Strawberries   Penicillins       Medication List        Accurate as of 01/29/18 11:59 PM. Always use your most recent med list.          acetaminophen 325 MG tablet Commonly known as:  TYLENOL Take 650 mg by mouth every 4 (four) hours as needed. for pain/ increased temp. May be administered orally, per G-tube if needed or rectally if unable to swallow (separate order). Maximum of 3000 mg Acetaminophen in 24 hours.   acetaminophen 500 MG tablet  Commonly known as:  TYLENOL Take 500 mg by mouth 2 (two) times daily. Maximum of 3000 mg of acetaminophen in 24 hours. Check prn acetaminophen order prior to administration due to limit.   alfuzosin 10 MG 24 hr tablet Commonly known as:  UROXATRAL Take 10 mg by mouth daily.   aspirin 325 MG EC tablet Take 325 mg by mouth daily.   bisacodyl 10 MG suppository Commonly known as:  DULCOLAX Place 10 mg rectally daily as needed for moderate constipation.   buPROPion 150 MG 24 hr tablet Commonly known as:  WELLBUTRIN XL Take 150 mg by mouth daily.   carbidopa-levodopa 50-200 MG tablet Commonly known as:  SINEMET CR Take 1 tablet by mouth 2 (two) times daily.   carbidopa-levodopa 25-100 MG tablet Commonly known as:   SINEMET IR Take 2 tablets by mouth 3 (three) times daily.   clonazePAM 0.5 MG tablet Commonly known as:  KLONOPIN Take 0.5 mg by mouth at bedtime.   coal tar 0.5 % shampoo Commonly known as:  NEUTROGENA T-GEL Apply 1 application topically 2 (two) times a week. Enough to lather   cyanocobalamin 1000 MCG tablet Take 1,000 mcg by mouth daily.   ENDIT EX Apply liberal amount topically to areas of skin irritation 2 times daily and prn. Ok to leave at bedside.   hydrocortisone cream 1 % Apply thin film topically  to clean, dry hemorrhoids 4 times a day as needed   magnesium hydroxide 400 MG/5ML suspension Commonly known as:  MILK OF MAGNESIA Take 30 mLs by mouth every 4 (four) hours as needed for mild constipation.   Melatonin 3 MG Tabs Take 3 mg by mouth at bedtime.   midodrine 2.5 MG tablet Commonly known as:  PROAMATINE Take 2.5 mg by mouth 3 (three) times daily with meals. Take along with sinemet   morphine 20 MG/ML concentrated solution Commonly known as:  ROXANOL Take 5-10 mg by mouth every hour as needed for severe pain.   NUTRITIONAL SUPPLEMENTS PO Give Magic Cup by mouth 2 times daily with lunch and supper tray.   ondansetron 4 MG tablet Commonly known as:  ZOFRAN Take 4 mg by mouth every 4 (four) hours as needed for nausea or vomiting.   Phenol 0.5 % Liqd Use as directed 2 sprays in the mouth or throat every 2 (two) hours as needed.   predniSONE 5 MG tablet Commonly known as:  DELTASONE Take 5 mg by mouth daily.   QUEtiapine 50 MG tablet Commonly known as:  SEROQUEL Take 50 mg by mouth 2 (two) times daily.   RISA-BID PROBIOTIC Tabs Take 1 tablet by mouth 2 (two) times daily.   sennosides-docusate sodium 8.6-50 MG tablet Commonly known as:  SENOKOT-S Take 2 tablets by mouth 3 (three) times daily.   sertraline 50 MG tablet Commonly known as:  ZOLOFT Take 75 mg by mouth daily. 1 and 1/2 tablet   sorbitol 70 % solution Take 45 mLs by mouth 3 (three)  times daily.   sorbitol 70 % solution Take 30 mLs by mouth daily as needed. Give every 2 hours until pt has good BM. Then may change order to prn.   triamcinolone cream 0.1 % Commonly known as:  KENALOG Apply 1 application topically 2 (two) times daily as needed.   trolamine salicylate 10 % cream Commonly known as:  ASPERCREME Apply thin film to the neck 3 times day for pain until resolved. Then may change to prn  Review of Systems  Unable to perform ROS: Dementia  Constitutional: Negative for activity change, appetite change, chills, diaphoresis and fever.  HENT: Negative for congestion, mouth sores, nosebleeds, postnasal drip, sneezing, sore throat, trouble swallowing and voice change.   Respiratory: Negative for apnea, cough, choking, chest tightness, shortness of breath and wheezing.   Cardiovascular: Negative for chest pain, palpitations and leg swelling.  Gastrointestinal: Negative for abdominal distention, abdominal pain, constipation, diarrhea and nausea.  Genitourinary: Negative for difficulty urinating, dysuria, frequency and urgency.       Increased nocturia  Musculoskeletal: Negative for back pain, gait problem and myalgias. Arthralgias: typical arthritis.  Skin: Negative for color change, pallor, rash and wound.  Neurological: Positive for tremors, speech difficulty and weakness. Negative for dizziness, syncope, numbness and headaches.  Psychiatric/Behavioral: Positive for dysphoric mood. Negative for agitation and behavioral problems.  All other systems reviewed and are negative.   Immunization History  Administered Date(s) Administered  . Influenza, High Dose Seasonal PF 09/22/2017  . Influenza-Unspecified 10/09/2012, 09/28/2016  . PPD Test 10/12/2016  . Pneumococcal-Unspecified 05/13/2014   Pertinent  Health Maintenance Due  Topic Date Due  . PNA vac Low Risk Adult (2 of 2 - PCV13) 05/14/2015  . INFLUENZA VACCINE  Completed   No flowsheet data  found. Functional Status Survey:    Vitals:   01/29/18 1031  BP: 120/70  Pulse: 78  Resp: 20  Temp: 98.2 F (36.8 C)  TempSrc: Oral  SpO2: 96%  Weight: 148 lb 6.4 oz (67.3 kg)  Height: 5\' 8"  (1.727 m)   Body mass index is 22.56 kg/m. Physical Exam  Constitutional: He is oriented to person, place, and time. Vital signs are normal. He appears well-developed and well-nourished. He is active and cooperative. He does not appear ill. No distress.  HENT:  Head: Normocephalic and atraumatic.  Mouth/Throat: Uvula is midline, oropharynx is clear and moist and mucous membranes are normal. Mucous membranes are not pale, not dry and not cyanotic.  Eyes: Conjunctivae, EOM and lids are normal. Pupils are equal, round, and reactive to light.  Neck: Trachea normal, normal range of motion and full passive range of motion without pain. Neck supple. No JVD present. No tracheal deviation, no edema and no erythema present. No thyromegaly present.  Cardiovascular: Normal rate, regular rhythm, normal heart sounds, intact distal pulses and normal pulses. Exam reveals no gallop, no distant heart sounds and no friction rub.  No murmur heard. Pulses:      Dorsalis pedis pulses are 2+ on the right side, and 2+ on the left side.  No edema  Pulmonary/Chest: Effort normal and breath sounds normal. No accessory muscle usage. No respiratory distress. He has no decreased breath sounds. He has no wheezes. He has no rhonchi. He has no rales. He exhibits no tenderness.  Abdominal: Soft. Normal appearance and bowel sounds are normal. He exhibits no distension and no ascites. There is no tenderness.  Musculoskeletal: Normal range of motion. He exhibits no edema or tenderness.  Expected osteoarthritis, stiffness; Bilateral Calves soft, supple. Negative Homan's Sign. B- pedal pulses equal; generalized weakness  Neurological: He is alert and oriented to person, place, and time. He has normal strength. He displays atrophy. A  sensory deficit is present. He exhibits abnormal muscle tone. Coordination and gait abnormal.  Parkinson's tremors  Skin: Skin is warm, dry and intact. He is not diaphoretic. No cyanosis. No pallor. Nails show no clubbing.  Psychiatric: Thought content normal. His affect is labile. His speech is  delayed. He is slowed. Cognition and memory are impaired. He expresses impulsivity. He exhibits abnormal recent memory.  Nursing note and vitals reviewed.   Labs reviewed: Recent Labs    06/05/17 0500 12/09/17 0500  NA 137 137  K 4.0 3.9  CL 104 101  CO2 27 25  GLUCOSE 88 67  BUN 20 11  CREATININE 0.88 1.00  CALCIUM 8.7* 8.9  MG  --  2.0   Recent Labs    06/05/17 0500 12/09/17 0500  AST 17 18  ALT <5* <5*  ALKPHOS 78 74  BILITOT 0.6 0.8  PROT 6.0* 6.1*  ALBUMIN 3.3* 3.7   Recent Labs    06/05/17 0500 12/09/17 0500  WBC 6.3 5.1  NEUTROABS 3.9 3.0  HGB 11.2* 13.0  HCT 32.8* 37.9*  MCV 97.4 104.6*  PLT 179 203   Lab Results  Component Value Date   TSH 3.787 12/09/2017   No results found for: HGBA1C Lab Results  Component Value Date   CHOL 136 11/29/2015   HDL 44 11/29/2015   LDLCALC 80 11/29/2015   TRIG 59 11/29/2015   CHOLHDL 3.1 11/29/2015    Significant Diagnostic Results in last 30 days:  No results found.  Assessment/Plan Mujahid was seen today for medical management of chronic issues.  Diagnoses and all orders for this visit:  Dementia in other diseases classified elsewhere without behavioral disturbance  REM sleep behavior disorder  Polyneuropathy   Continue current medication regimen  F/U with Neurology about REM sleep and dementia  Safety precautions  Continue Hospice services for comfort  Routine labs not due  Family/ staff Communication:   Total Time:  Documentation:  Face to Face:  Family/Phone:   Labs/tests ordered:  Not due  Medication list reviewed and assessed for continued appropriateness. Monthly medication orders  reviewed and signed.  Vikki Ports, NP-C Geriatrics [facility] [phone], [place] [address] Cell Phone (Mon-Fri 8am-5pm):  [phone] On Call:  [phone] & follow prompts after 5pm & weekends Office Phone:  [phone] Office Fax:  [phone]

## 2018-03-02 NOTE — Assessment & Plan Note (Signed)
Progressive. Pt upset about increasing forgetfulness/ worsening cognition, losing "train of thought" easily. No outbursts or refusal of care. No known hallucinations. Symptoms controlled with Seroquel 50 mg po BID.

## 2018-03-30 ENCOUNTER — Encounter
Admission: RE | Admit: 2018-03-30 | Discharge: 2018-03-30 | Disposition: A | Discharge status: Home / Self Care | Payer: BLUE CROSS/BLUE SHIELD | Source: Ambulatory Visit | Attending: Internal Medicine | Admitting: Internal Medicine

## 2018-04-01 ENCOUNTER — Non-Acute Institutional Stay (SKILLED_NURSING_FACILITY): Payer: Medicare Other | Admitting: Gerontology

## 2018-04-01 ENCOUNTER — Encounter: Payer: Self-pay | Admitting: Gerontology

## 2018-04-01 DIAGNOSIS — L409 Psoriasis, unspecified: Secondary | ICD-10-CM | POA: Diagnosis not present

## 2018-04-01 DIAGNOSIS — F0281 Dementia in other diseases classified elsewhere with behavioral disturbance: Secondary | ICD-10-CM

## 2018-04-01 DIAGNOSIS — L4050 Arthropathic psoriasis, unspecified: Secondary | ICD-10-CM

## 2018-04-01 DIAGNOSIS — G301 Alzheimer's disease with late onset: Secondary | ICD-10-CM | POA: Diagnosis not present

## 2018-04-01 DIAGNOSIS — F02818 Dementia in other diseases classified elsewhere, unspecified severity, with other behavioral disturbance: Secondary | ICD-10-CM

## 2018-04-07 NOTE — Progress Notes (Signed)
Location:    Nursing Home Room Number: 341D Place of Service:  SNF (31) Provider:  Toni Arthurs, NP-C  Kirk Ruths, MD  Patient Care Team: Kirk Ruths, MD as PCP - General (Internal Medicine) Toni Arthurs, NP as Nurse Practitioner (Family Medicine)  Extended Emergency Contact Information Primary Emergency Contact: Derl Barrow (Daughter)  [place] Phone: [phone] Relation: None  Code Status:  DNR Goals of care: Advanced Directive information Advanced Directives 04/01/2018  Does Patient Have a Medical Advance Directive? Yes  Type of Advance Directive Out of facility DNR (pink MOST or yellow form)  Does patient want to make changes to medical advance directive? No - Patient declined  Copy of [place] in Chart? -  Pre-existing out of facility DNR order (yellow form or pink MOST form) Yellow form placed in chart (order not valid for inpatient use)     Chief Complaint  Patient presents with  . Medical Management of Chronic Issues    Routine Visit    HPI:  Pt is a 84 y.o. male seen today for medical management of chronic diseases.    Alzheimer's disease Stable/ Slowly progressive. Pt having worsening ability to complete thoughts and sentences. This is bothersome to pt. Some agitation at times, mostly related to boredom. On Seroquel 50 mg po BID and Klonopin for the agitation. Nemenda and Exelon have paradoxical effect on behaviors.    Psoriatic arthritis ([place]) Stable. No longer on methotrexate. No complaints of increased/ worsening of symptoms/pain.   Psoriasis Stable. Not on systemic treatment. Symptoms controlled with OTC Gold Bond lotion for psoriasis/eczema. Also on Neutrogena T-Gel shampoo twice weekly and Kenalog 0.1% cream BID prn.     Past Medical History:  Diagnosis Date  . Anemia, unspecified    pernicious  . Anxiety    unspecified  . Arthritis   . Atrial fibrillation ([place])   . CHF  (congestive heart failure) ([place]) 01/2006  . Kidney stones   . Nephrolithiasis   . Parkinson's disease ([place])   . Peripheral neuropathy   . Prostate cancer ([place]) 11/2006  . Psoriasis   . Psoriasis, unspecified   . Skin cancer   . Sleep disturbance    REM  . Spinal stenosis    s/p lumbar laminectomy  . Unspecified atrial fibrillation ([place])   . Upper GI bleed    unspecified; history of AVMs  . Valvular heart disease   . Vertigo    Past Surgical History:  Procedure Laterality Date  . CATARACT EXTRACTION    . EYE SURGERY    . INTERAL BLEEDING  10/2011   stomach  . MOHS SURGERY     Facial skin   . PROSTATE SURGERY    . SPINE SURGERY      Allergies  Allergen Reactions  . Exelon [Rivastigmine Tartrate]     Increased agitation/irritability  . Other     Strawberries  . Penicillins     Allergies as of 04/01/2018      Reactions   Exelon [rivastigmine Tartrate]    Increased agitation/irritability   Other    Strawberries   Penicillins       Medication List        Accurate as of 04/01/18 11:59 PM. Always use your most recent med list.          acetaminophen 325 MG tablet Commonly known as:  TYLENOL Take 650 mg by mouth every 4 (four) hours as needed. for pain/ increased temp. May  be administered orally, per G-tube if needed or rectally if unable to swallow (separate order). Maximum of 3000 mg Acetaminophen in 24 hours.   acetaminophen 500 MG tablet Commonly known as:  TYLENOL Take 500 mg by mouth 2 (two) times daily. Maximum of 3000 mg of acetaminophen in 24 hours. Check prn acetaminophen order prior to administration due to limit.   alfuzosin 10 MG 24 hr tablet Commonly known as:  UROXATRAL Take 10 mg by mouth daily.   aspirin 325 MG EC tablet Take 325 mg by mouth daily.   bisacodyl 10 MG suppository Commonly known as:  DULCOLAX Place 10 mg rectally daily as needed for moderate constipation.   buPROPion 150 MG 24 hr tablet Commonly known as:  WELLBUTRIN  XL Take 150 mg by mouth daily.   carbidopa-levodopa 50-200 MG tablet Commonly known as:  SINEMET CR Take 1 tablet by mouth 2 (two) times daily.   carbidopa-levodopa 25-100 MG tablet Commonly known as:  SINEMET IR Take 2 tablets by mouth 3 (three) times daily.   clonazePAM 1 MG tablet Commonly known as:  KLONOPIN Take 1 mg by mouth at bedtime.   clonazePAM 0.5 MG tablet Commonly known as:  KLONOPIN Take 0.5 mg by mouth daily.   coal tar 0.5 % shampoo Commonly known as:  NEUTROGENA T-GEL Apply 1 application topically 2 (two) times a week. Enough to lather   cyanocobalamin 1000 MCG tablet Take 1,000 mcg by mouth daily.   ENDIT EX Apply liberal amount topically to areas of skin irritation 2 times daily and prn. Ok to leave at bedside.   hydrocortisone cream 1 % Apply thin film topically  to clean, dry hemorrhoids 4 times a day as needed   magnesium hydroxide 400 MG/5ML suspension Commonly known as:  MILK OF MAGNESIA Take 30 mLs by mouth every 4 (four) hours as needed for mild constipation.   Melatonin 3 MG Tabs Take 3 mg by mouth at bedtime.   midodrine 2.5 MG tablet Commonly known as:  PROAMATINE Take 2.5 mg by mouth 3 (three) times daily with meals. Take along with sinemet   morphine 20 MG/ML concentrated solution Commonly known as:  ROXANOL Take 5-10 mg by mouth every hour as needed for severe pain.   NUTRITIONAL SUPPLEMENTS PO Give Magic Cup by mouth 2 times daily with lunch and supper tray.   ondansetron 4 MG tablet Commonly known as:  ZOFRAN Take 4 mg by mouth every 4 (four) hours as needed for nausea or vomiting.   Phenol 0.5 % Liqd Use as directed 2 sprays in the mouth or throat every 2 (two) hours as needed.   predniSONE 5 MG tablet Commonly known as:  DELTASONE Take 5 mg by mouth daily.   QUEtiapine 50 MG tablet Commonly known as:  SEROQUEL Take 50 mg by mouth 2 (two) times daily.   RISA-BID PROBIOTIC Tabs Take 1 tablet by mouth 2 (two) times  daily.   sennosides-docusate sodium 8.6-50 MG tablet Commonly known as:  SENOKOT-S Take 2 tablets by mouth 3 (three) times daily.   sertraline 50 MG tablet Commonly known as:  ZOLOFT Take 75 mg by mouth daily. 1 and 1/2 tablet   sorbitol 70 % solution Take 45 mLs by mouth 3 (three) times daily.   sorbitol 70 % solution Take 30 mLs by mouth daily as needed. Give every 2 hours until pt has good BM. Then may change order to prn.   triamcinolone cream 0.1 % Commonly known as:  KENALOG  Apply 1 application topically 2 (two) times daily as needed.   trolamine salicylate 10 % cream Commonly known as:  ASPERCREME Apply thin film to the neck 3 times day prn  for pain       Review of Systems  Constitutional: Negative for activity change, appetite change, chills, diaphoresis and fever.  HENT: Negative for congestion, mouth sores, nosebleeds, postnasal drip, sneezing, sore throat, trouble swallowing and voice change.   Respiratory: Negative for apnea, cough, choking, chest tightness, shortness of breath and wheezing.   Cardiovascular: Negative for chest pain, palpitations and leg swelling.  Gastrointestinal: Negative for abdominal distention, abdominal pain, constipation, diarrhea and nausea.  Genitourinary: Negative for difficulty urinating, dysuria, frequency and urgency.  Musculoskeletal: Positive for arthralgias (typical arthritis) and gait problem. Negative for back pain and myalgias.  Skin: Negative for color change, pallor, rash and wound.  Neurological: Positive for tremors, speech difficulty and weakness. Negative for dizziness, syncope, numbness and headaches.  Psychiatric/Behavioral: Positive for agitation (at times). Negative for behavioral problems.  All other systems reviewed and are negative.   Immunization History  Administered Date(s) Administered  . Influenza, High Dose Seasonal PF 09/22/2017  . Influenza-Unspecified 10/09/2012, 09/28/2016  . PPD Test 10/12/2016  .  Pneumococcal-Unspecified 05/13/2014   Pertinent  Health Maintenance Due  Topic Date Due  . PNA vac Low Risk Adult (2 of 2 - PCV13) 05/14/2015  . INFLUENZA VACCINE  07/30/2018   No flowsheet data found. Functional Status Survey:    Vitals:   04/01/18 1614  BP: (!) 163/76  Pulse: 74  Resp: 18  Temp: 97.8 F (36.6 C)  TempSrc: Oral  SpO2: 96%  Weight: 145 lb 14.4 oz (66.2 kg)  Height: 5\' 8"  (1.727 m)   Body mass index is 22.18 kg/m. Physical Exam  Constitutional: He is oriented to person, place, and time. Vital signs are normal. He appears well-developed and well-nourished. He is active and cooperative. He does not appear ill. No distress.  HENT:  Head: Normocephalic and atraumatic.  Mouth/Throat: Uvula is midline, oropharynx is clear and moist and mucous membranes are normal. Mucous membranes are not pale, not dry and not cyanotic.  Eyes: Pupils are equal, round, and reactive to light. Conjunctivae, EOM and lids are normal.  Neck: Trachea normal, normal range of motion and full passive range of motion without pain. Neck supple. No JVD present. No tracheal deviation, no edema and no erythema present. No thyromegaly present.  Cardiovascular: Normal rate, regular rhythm, normal heart sounds, intact distal pulses and normal pulses. Exam reveals no gallop, no distant heart sounds and no friction rub.  No murmur heard. Pulses:      Dorsalis pedis pulses are 2+ on the right side, and 2+ on the left side.  No edema  Pulmonary/Chest: Effort normal and breath sounds normal. No accessory muscle usage. No respiratory distress. He has no decreased breath sounds. He has no wheezes. He has no rhonchi. He has no rales. He exhibits no tenderness.  Abdominal: Soft. Normal appearance and bowel sounds are normal. He exhibits no distension and no ascites. There is no tenderness.  Musculoskeletal: Normal range of motion. He exhibits no edema or tenderness.  Expected osteoarthritis, stiffness;  Bilateral Calves soft, supple. Negative Homan's Sign. B- pedal pulses equal; generalized weakness  Neurological: He is alert and oriented to person, place, and time. He has normal strength. He displays atrophy and tremor. A sensory deficit is present. He exhibits abnormal muscle tone. Coordination and gait abnormal.  Skin: Skin  is warm, dry and intact. He is not diaphoretic. No cyanosis. No pallor. Nails show no clubbing.  Psychiatric: He has a normal mood and affect. His speech is normal. Judgment and thought content normal. He is slowed. Cognition and memory are impaired. He exhibits abnormal recent memory.  Nursing note and vitals reviewed.   Labs reviewed: Recent Labs    06/05/17 0500 12/09/17 0500  NA 137 137  K 4.0 3.9  CL 104 101  CO2 27 25  GLUCOSE 88 67  BUN 20 11  CREATININE 0.88 1.00  CALCIUM 8.7* 8.9  MG  --  2.0   Recent Labs    06/05/17 0500 12/09/17 0500  AST 17 18  ALT <5* <5*  ALKPHOS 78 74  BILITOT 0.6 0.8  PROT 6.0* 6.1*  ALBUMIN 3.3* 3.7   Recent Labs    06/05/17 0500 12/09/17 0500  WBC 6.3 5.1  NEUTROABS 3.9 3.0  HGB 11.2* 13.0  HCT 32.8* 37.9*  MCV 97.4 104.6*  PLT 179 203   Lab Results  Component Value Date   TSH 3.787 12/09/2017   No results found for: HGBA1C Lab Results  Component Value Date   CHOL 136 11/29/2015   HDL 44 11/29/2015   LDLCALC 80 11/29/2015   TRIG 59 11/29/2015   CHOLHDL 3.1 11/29/2015    Significant Diagnostic Results in last 30 days:  No results found.  Assessment/Plan Sang was seen today for medical management of chronic issues.  Diagnoses and all orders for this visit:  Late onset Alzheimer's disease with behavioral disturbance  Psoriatic arthritis ([place])  Psoriasis   Above listed conditions stable  Continue current medication regimen  Monitor for worsening symptoms of skin rashes/ irritation  Monitor for worsening symptoms of agitation, worsening behaviors  Allow adequate time for pt to  express thoughts   Assist with ADLs, meals as appropriate  Safety precautions  Fall precautions  Continue Hospice services for EOL Care  Family/ staff Communication:   Total Time:  Documentation:  Face to Face:  Family/Phone:   Labs/tests ordered:  Not due- on Hospice services  Medication list reviewed and assessed for continued appropriateness. Monthly medication orders reviewed and signed.  Vikki Ports, NP-C Geriatrics [facility] [phone], [place] [address] Cell Phone (Mon-Fri 8am-5pm):  [phone] On Call:  [phone] & follow prompts after 5pm & weekends Office Phone:  [phone] Office Fax:  [phone]

## 2018-04-07 NOTE — Assessment & Plan Note (Signed)
Stable. No longer on methotrexate. No complaints of increased/ worsening of symptoms/pain.

## 2018-04-07 NOTE — Assessment & Plan Note (Signed)
Stable/ Slowly progressive. Pt having worsening ability to complete thoughts and sentences. This is bothersome to pt. Some agitation at times, mostly related to boredom. On Seroquel 50 mg po BID and Klonopin for the agitation. Nemenda and Exelon have paradoxical effect on behaviors.

## 2018-04-07 NOTE — Assessment & Plan Note (Signed)
Stable. Not on systemic treatment. Symptoms controlled with OTC Gold Bond lotion for psoriasis/eczema. Also on Neutrogena T-Gel shampoo twice weekly and Kenalog 0.1% cream BID prn.

## 2018-04-09 ENCOUNTER — Other Ambulatory Visit
Admission: RE | Admit: 2018-04-09 | Discharge: 2018-04-09 | Disposition: A | Discharge status: Home / Self Care | Source: Ambulatory Visit | Attending: Internal Medicine | Admitting: Internal Medicine

## 2018-04-09 DIAGNOSIS — R410 Disorientation, unspecified: Secondary | ICD-10-CM | POA: Insufficient documentation

## 2018-04-09 DIAGNOSIS — R350 Frequency of micturition: Secondary | ICD-10-CM | POA: Insufficient documentation

## 2018-04-10 LAB — URINE CULTURE

## 2018-04-15 ENCOUNTER — Encounter: Payer: Self-pay | Admitting: Gerontology

## 2018-04-16 ENCOUNTER — Other Ambulatory Visit
Admission: RE | Admit: 2018-04-16 | Discharge: 2018-04-16 | Disposition: A | Discharge status: Home / Self Care | Source: Ambulatory Visit | Attending: Internal Medicine | Admitting: Internal Medicine

## 2018-04-16 DIAGNOSIS — R410 Disorientation, unspecified: Secondary | ICD-10-CM | POA: Diagnosis present

## 2018-04-18 LAB — URINE CULTURE: Culture: NO GROWTH

## 2018-04-29 ENCOUNTER — Encounter
Admission: RE | Admit: 2018-04-29 | Discharge: 2018-04-29 | Disposition: A | Discharge status: Home / Self Care | Payer: Medicare Other | Source: Ambulatory Visit | Attending: Internal Medicine | Admitting: Internal Medicine

## 2018-05-03 ENCOUNTER — Other Ambulatory Visit
Admission: RE | Admit: 2018-05-03 | Discharge: 2018-05-03 | Disposition: A | Discharge status: Home / Self Care | Payer: Medicare Other | Source: Skilled Nursing Facility | Attending: Internal Medicine | Admitting: Internal Medicine

## 2018-05-03 DIAGNOSIS — R410 Disorientation, unspecified: Secondary | ICD-10-CM | POA: Diagnosis present

## 2018-05-04 LAB — URINALYSIS, COMPLETE (UACMP) WITH MICROSCOPIC
BILIRUBIN URINE: NEGATIVE
Bacteria, UA: NONE SEEN
GLUCOSE, UA: NEGATIVE mg/dL
Ketones, ur: 5 mg/dL — AB
Leukocytes, UA: NEGATIVE
NITRITE: NEGATIVE
PH: 6 (ref 5.0–8.0)
Protein, ur: NEGATIVE mg/dL
RBC / HPF: >50 RBC/hpf — ABNORMAL HIGH (ref 0–5)
SPECIFIC GRAVITY, URINE: 1.009 (ref 1.005–1.030)
WBC, UA: >50 WBC/hpf — ABNORMAL HIGH (ref 0–5)

## 2018-05-21 ENCOUNTER — Other Ambulatory Visit
Admission: RE | Admit: 2018-05-21 | Discharge: 2018-05-21 | Disposition: A | Discharge status: Home / Self Care | Payer: Medicare Other | Source: Ambulatory Visit | Attending: Gerontology | Admitting: Gerontology

## 2018-05-21 DIAGNOSIS — R319 Hematuria, unspecified: Secondary | ICD-10-CM | POA: Insufficient documentation

## 2018-05-21 LAB — COMPREHENSIVE METABOLIC PANEL
ALK PHOS: 79 U/L (ref 38–126)
AST: 20 U/L (ref 15–41)
Albumin: 4.3 g/dL (ref 3.5–5.0)
Anion gap: 7 (ref 5–15)
BILIRUBIN TOTAL: 0.6 mg/dL (ref 0.3–1.2)
BUN: 13 mg/dL (ref 6–20)
CALCIUM: 8.9 mg/dL (ref 8.9–10.3)
CO2: 27 mmol/L (ref 22–32)
CREATININE: 0.97 mg/dL (ref 0.61–1.24)
Chloride: 101 mmol/L (ref 101–111)
GFR calc Af Amer: >60 mL/min (ref >60)
Glucose, Bld: 107 mg/dL — ABNORMAL HIGH (ref 65–99)
Potassium: 4.1 mmol/L (ref 3.5–5.1)
Sodium: 135 mmol/L (ref 135–145)
TOTAL PROTEIN: 6.8 g/dL (ref 6.5–8.1)

## 2018-05-21 LAB — CBC WITH DIFFERENTIAL/PLATELET
Basophils Absolute: 0 10*3/uL (ref 0–0.1)
Basophils Relative: 0 %
EOS ABS: 0.1 10*3/uL (ref 0–0.7)
EOS PCT: 2 %
HCT: 36.2 % — ABNORMAL LOW (ref 40.0–52.0)
HEMOGLOBIN: 12.3 g/dL — AB (ref 13.0–18.0)
Lymphocytes Relative: 10 %
Lymphs Abs: 0.7 10*3/uL — ABNORMAL LOW (ref 1.0–3.6)
MCH: 35.9 pg — AB (ref 26.0–34.0)
MCHC: 34 g/dL (ref 32.0–36.0)
MCV: 105.6 fL — AB (ref 80.0–100.0)
MONO ABS: 0.6 10*3/uL (ref 0.2–1.0)
Monocytes Relative: 9 %
NEUTROS ABS: 5.1 10*3/uL (ref 1.4–6.5)
Neutrophils Relative %: 79 %
Platelets: 217 10*3/uL (ref 150–440)
RBC: 3.43 MIL/uL — AB (ref 4.40–5.90)
RDW: 13.6 % (ref 11.5–14.5)
WBC: 6.5 10*3/uL (ref 3.8–10.6)

## 2018-05-21 LAB — PSA: PROSTATIC SPECIFIC ANTIGEN: 0.03 ng/mL (ref 0.00–4.00)

## 2018-05-30 ENCOUNTER — Encounter
Admission: RE | Admit: 2018-05-30 | Discharge: 2018-05-30 | Disposition: A | Discharge status: Home / Self Care | Payer: Medicare Other | Source: Ambulatory Visit | Attending: Internal Medicine | Admitting: Internal Medicine

## 2018-06-09 ENCOUNTER — Encounter: Payer: Self-pay | Admitting: Gerontology

## 2018-06-09 ENCOUNTER — Non-Acute Institutional Stay (SKILLED_NURSING_FACILITY): Payer: Medicare Other | Admitting: Gerontology

## 2018-06-09 DIAGNOSIS — D649 Anemia, unspecified: Secondary | ICD-10-CM

## 2018-06-09 DIAGNOSIS — F419 Anxiety disorder, unspecified: Secondary | ICD-10-CM | POA: Diagnosis not present

## 2018-06-09 DIAGNOSIS — C61 Malignant neoplasm of prostate: Secondary | ICD-10-CM | POA: Diagnosis not present

## 2018-06-21 NOTE — Assessment & Plan Note (Signed)
Stable. On Clonazepam 1 mg at HS and 0.5 mg in the afternoon for anxiety. On seroquel 25 mg po BID. Currently undergoing GDR of Seroquel. No negative effects.

## 2018-06-21 NOTE — Assessment & Plan Note (Signed)
Stable. Hgb 12.3 on 5/23. Not on iron supplementation

## 2018-06-21 NOTE — Progress Notes (Signed)
Location:    [facility] Home Room Number: 109N Place of Service:  SNF (31) Provider:  Toni Arthurs, NP-C  Kirk Ruths, MD  Patient Care Team: Kirk Ruths, MD as PCP - General (Internal Medicine) Toni Arthurs, NP as Nurse Practitioner (Family Medicine)  Extended Emergency Contact Information Primary Emergency Contact: Derl Barrow (Daughter)  [place] Phone: [phone] Relation: None  Code Status:  DNR Goals of care: Advanced Directive information Advanced Directives 06/09/2018  Does Patient Have a Medical Advance Directive? Yes  Type of Advance Directive Out of facility DNR (pink MOST or yellow form)  Does patient want to make changes to medical advance directive? No - Patient declined  Copy of [place] in Chart? -  Pre-existing out of facility DNR order (yellow form or pink MOST form) Yellow form placed in chart (order not valid for inpatient use)     Chief Complaint  Patient presents with  . Medical Management of Chronic Issues    Routine Visit    HPI:  Pt is a 84 y.o. male seen today for medical management of chronic diseases.    Malignant neoplasm of prostate ([place]) Stable. No current treatment at this time.  Anemia Stable. Hgb 12.3 on 5/23. Not on iron supplementation  Anxiety Stable. On Clonazepam 1 mg at HS and 0.5 mg in the afternoon for anxiety. On seroquel 25 mg po BID. Currently undergoing GDR of Seroquel. No negative effects.    Past Medical History:  Diagnosis Date  . Anemia, unspecified    pernicious  . Anxiety    unspecified  . Arthritis   . Atrial fibrillation ([place])   . CHF (congestive heart failure) ([place]) 01/2006  . Kidney stones   . Nephrolithiasis   . Parkinson's disease ([place])   . Peripheral neuropathy   . Prostate cancer ([place]) 11/2006  . Psoriasis   . Psoriasis, unspecified   . Skin cancer   . Sleep disturbance    REM  . Spinal stenosis    s/p lumbar laminectomy  .  Unspecified atrial fibrillation ([place])   . Upper GI bleed    unspecified; history of AVMs  . Valvular heart disease   . Vertigo    Past Surgical History:  Procedure Laterality Date  . CATARACT EXTRACTION    . EYE SURGERY    . INTERAL BLEEDING  10/2011   stomach  . MOHS SURGERY     Facial skin   . PROSTATE SURGERY    . SPINE SURGERY      Allergies  Allergen Reactions  . Exelon [Rivastigmine Tartrate]     Increased agitation/irritability  . Other     Strawberries  . Penicillins     Allergies as of 06/09/2018      Reactions   Exelon [rivastigmine Tartrate]    Increased agitation/irritability   Other    Strawberries   Penicillins       Medication List        Accurate as of 06/09/18 11:59 PM. Always use your most recent med list.          acetaminophen 325 MG tablet Commonly known as:  TYLENOL Take 650 mg by mouth every 4 (four) hours as needed. for pain/ increased temp. May be administered orally, per G-tube if needed or rectally if unable to swallow (separate order). Maximum of 3000 mg Acetaminophen in 24 hours.   acetaminophen 500 MG tablet Commonly known as:  TYLENOL Take 500 mg by mouth 2 (two)  times daily. Maximum of 3000 mg of acetaminophen in 24 hours. Check prn acetaminophen order prior to administration due to limit.   alfuzosin 10 MG 24 hr tablet Commonly known as:  UROXATRAL Take 10 mg by mouth daily.   aspirin 325 MG EC tablet Take 325 mg by mouth daily.   bisacodyl 10 MG suppository Commonly known as:  DULCOLAX Place 10 mg rectally daily as needed for moderate constipation.   buPROPion 150 MG 24 hr tablet Commonly known as:  WELLBUTRIN XL Take 150 mg by mouth daily.   carbidopa-levodopa 50-200 MG tablet Commonly known as:  SINEMET CR Take 1 tablet by mouth 2 (two) times daily.   carbidopa-levodopa 25-100 MG tablet Commonly known as:  SINEMET IR Take 2 tablets by mouth 3 (three) times daily.   clonazePAM 1 MG tablet Commonly known as:   KLONOPIN Take 1 mg by mouth at bedtime.   clonazePAM 0.5 MG tablet Commonly known as:  KLONOPIN Take 0.5 mg by mouth daily.   coal tar 0.5 % shampoo Commonly known as:  NEUTROGENA T-GEL Apply 1 application topically 2 (two) times daily. Enough to lather   cyanocobalamin 1000 MCG tablet Take 1,000 mcg by mouth daily.   ENDIT EX Apply liberal amount topically to areas of skin irritation 2 times daily and prn. Ok to leave at bedside.   hydrocortisone cream 1 % Apply thin film topically  to clean, dry hemorrhoids 4 times a day as needed   magnesium hydroxide 400 MG/5ML suspension Commonly known as:  MILK OF MAGNESIA Take 30 mLs by mouth every 4 (four) hours as needed for mild constipation.   Melatonin 3 MG Tabs Take 3 mg by mouth at bedtime.   midodrine 2.5 MG tablet Commonly known as:  PROAMATINE Take 2.5 mg by mouth 3 (three) times daily with meals. Take along with sinemet   morphine 20 MG/ML concentrated solution Commonly known as:  ROXANOL Take 5-10 mg by mouth every hour as needed for severe pain.   NUTRITIONAL SUPPLEMENTS PO Give Magic Cup by mouth 2 times daily with lunch and supper tray.   ondansetron 4 MG tablet Commonly known as:  ZOFRAN Take 4 mg by mouth every 4 (four) hours as needed for nausea or vomiting.   Phenol 0.5 % Liqd Use as directed 2 sprays in the mouth or throat every 2 (two) hours as needed.   polyethylene glycol packet Commonly known as:  MIRALAX / GLYCOLAX Take 17 g by mouth 2 (two) times daily. Mix with 4-6 ounces of fluid   predniSONE 5 MG tablet Commonly known as:  DELTASONE Take 5 mg by mouth daily.   QUEtiapine 12.5 mg Tabs tablet Commonly known as:  SEROQUEL Take 37.5 mg by mouth at bedtime. 3 tabs  --   agitation, paranoia, hypersexuality, psychosis, mania (GDR 05/04/18)   QUEtiapine 25 MG tablet Commonly known as:  SEROQUEL Take 25 mg by mouth daily. antipsychotic   RISA-BID PROBIOTIC Tabs Take 1 tablet by mouth 2 (two) times  daily.   sennosides-docusate sodium 8.6-50 MG tablet Commonly known as:  SENOKOT-S Take 2 tablets by mouth 3 (three) times daily.   sertraline 50 MG tablet Commonly known as:  ZOLOFT Take 75 mg by mouth daily. 1 and 1/2 tablet   sorbitol 70 % solution Take 30 mLs by mouth daily as needed. Give every 2 hours until pt has good BM. Then may change order to prn.   triamcinolone cream 0.1 % Commonly known as:  KENALOG  Apply 1 application topically 2 (two) times daily as needed.   trolamine salicylate 10 % cream Commonly known as:  ASPERCREME Apply thin film to the neck 3 times day prn  for pain       Review of Systems  Constitutional: Negative for activity change, appetite change, chills, diaphoresis and fever.  HENT: Negative for congestion, mouth sores, nosebleeds, postnasal drip, sneezing, sore throat, trouble swallowing and voice change.   Respiratory: Negative for apnea, cough, choking, chest tightness, shortness of breath and wheezing.   Cardiovascular: Negative for chest pain, palpitations and leg swelling.  Gastrointestinal: Negative for abdominal distention, abdominal pain, constipation, diarrhea and nausea.  Genitourinary: Negative for difficulty urinating, dysuria, frequency and urgency.  Musculoskeletal: Positive for gait problem. Negative for back pain and myalgias. Arthralgias: typical arthritis.  Skin: Negative for color change, pallor, rash and wound.  Neurological: Positive for tremors, speech difficulty and weakness. Negative for dizziness, syncope, numbness and headaches.  Psychiatric/Behavioral: Positive for agitation, behavioral problems (at times) and confusion (at times).  All other systems reviewed and are negative.   Immunization History  Administered Date(s) Administered  . Influenza, High Dose Seasonal PF 09/22/2017  . Influenza-Unspecified 10/09/2012, 09/28/2016  . PPD Test 10/12/2016  . Pneumococcal-Unspecified 05/13/2014   Pertinent  Health  Maintenance Due  Topic Date Due  . PNA vac Low Risk Adult (2 of 2 - PCV13) 05/14/2015  . INFLUENZA VACCINE  07/30/2018   No flowsheet data found. Functional Status Survey:    Vitals:   06/09/18 1010  BP: (!) 88/52  Pulse: 69  Resp: 16  Temp: 98.1 F (36.7 C)  TempSrc: Oral  SpO2: 98%  Weight: 150 lb 6.4 oz (68.2 kg)  Height: 5\' 8"  (1.727 m)   Body mass index is 22.87 kg/m. Physical Exam  Constitutional: He is oriented to person, place, and time. Vital signs are normal. He appears well-developed and well-nourished. He is active and cooperative. He does not appear ill. No distress.  HENT:  Head: Normocephalic and atraumatic.  Mouth/Throat: Uvula is midline, oropharynx is clear and moist and mucous membranes are normal. Mucous membranes are not pale, not dry and not cyanotic.  Eyes: Pupils are equal, round, and reactive to light. Conjunctivae, EOM and lids are normal.  Neck: Trachea normal, normal range of motion and full passive range of motion without pain. Neck supple. No JVD present. No tracheal deviation, no edema and no erythema present. No thyromegaly present.  Cardiovascular: Normal rate, regular rhythm, normal heart sounds, intact distal pulses and normal pulses. Exam reveals no gallop, no distant heart sounds and no friction rub.  No murmur heard. Pulses:      Dorsalis pedis pulses are 2+ on the right side, and 2+ on the left side.  No edema  Pulmonary/Chest: Effort normal and breath sounds normal. No accessory muscle usage. No respiratory distress. He has no decreased breath sounds. He has no wheezes. He has no rhonchi. He has no rales. He exhibits no tenderness.  Abdominal: Soft. Normal appearance and bowel sounds are normal. He exhibits no distension and no ascites. There is no tenderness.  Musculoskeletal: Normal range of motion. He exhibits no edema or tenderness.  Expected osteoarthritis, stiffness; Bilateral Calves soft, supple. Negative Homan's Sign. B- pedal  pulses equal; generalized weakness, parkinsons  Neurological: He is alert and oriented to person, place, and time. He displays atrophy. He exhibits abnormal muscle tone. Coordination and gait abnormal.  Parkinson's Disease  Skin: Skin is warm, dry and intact. He  is not diaphoretic. No cyanosis. No pallor. Nails show no clubbing.  Psychiatric: His speech is normal and behavior is normal. His affect is labile. Thought content is paranoid (at times). Cognition and memory are impaired. He expresses impulsivity and inappropriate judgment. He exhibits abnormal recent memory.  Nursing note and vitals reviewed.   Labs reviewed: Recent Labs    12/09/17 0500 05/21/18 1310  NA 137 135  K 3.9 4.1  CL 101 101  CO2 25 27  GLUCOSE 67 107*  BUN 11 13  CREATININE 1.00 0.97  CALCIUM 8.9 8.9  MG 2.0  --    Recent Labs    12/09/17 0500 05/21/18 1310  AST 18 20  ALT <5* <5*  ALKPHOS 74 79  BILITOT 0.8 0.6  PROT 6.1* 6.8  ALBUMIN 3.7 4.3   Recent Labs    12/09/17 0500 05/21/18 1310  WBC 5.1 6.5  NEUTROABS 3.0 5.1  HGB 13.0 12.3*  HCT 37.9* 36.2*  MCV 104.6* 105.6*  PLT 203 217   Lab Results  Component Value Date   TSH 3.787 12/09/2017   No results found for: HGBA1C Lab Results  Component Value Date   CHOL 136 11/29/2015   HDL 44 11/29/2015   LDLCALC 80 11/29/2015   TRIG 59 11/29/2015   CHOLHDL 3.1 11/29/2015    Significant Diagnostic Results in last 30 days:  No results found.  Assessment/Plan Shervin was seen today for medical management of chronic issues.  Diagnoses and all orders for this visit:  Malignant neoplasm of prostate ([place])  Anemia, unspecified type  Anxiety   Above listed conditions stable  Continue current medication regimen  Continue GDR of Seroquel as tolerated  Monitor for worsening anxiety symptoms  Monitor for worsening urinary symptoms/ rentention  Continue to encourage participation in activities and interaction with other  residents  Continue private pay sitter for safety  Safety precautions  Fall precautions  Continue to be followed by Palliative Care  Family/ staff Communication:   Total Time:  Documentation:  Face to Face:  Family/Phone:   Labs/tests ordered:  Not due. Recent results reviewed- stable  Medication list reviewed and assessed for continued appropriateness. Monthly medication orders reviewed and signed.  Vikki Ports, NP-C Geriatrics [facility] [phone], [place] [address] Cell Phone (Mon-Fri 8am-5pm):  [phone] On Call:  [phone] & follow prompts after 5pm & weekends Office Phone:  [phone] Office Fax:  [phone]

## 2018-06-21 NOTE — Assessment & Plan Note (Signed)
Stable. No current treatment at this time.

## 2018-06-29 ENCOUNTER — Encounter
Admission: RE | Admit: 2018-06-29 | Discharge: 2018-06-29 | Disposition: A | Discharge status: Home / Self Care | Payer: Medicare Other | Source: Ambulatory Visit | Attending: Internal Medicine | Admitting: Internal Medicine

## 2018-06-29 DIAGNOSIS — R319 Hematuria, unspecified: Secondary | ICD-10-CM | POA: Insufficient documentation

## 2018-07-02 ENCOUNTER — Other Ambulatory Visit
Admission: RE | Admit: 2018-07-02 | Discharge: 2018-07-02 | Disposition: A | Discharge status: Home / Self Care | Payer: Medicare Other | Source: Skilled Nursing Facility | Attending: Internal Medicine | Admitting: Internal Medicine

## 2018-07-02 DIAGNOSIS — R319 Hematuria, unspecified: Secondary | ICD-10-CM | POA: Diagnosis present

## 2018-07-02 LAB — URINALYSIS, COMPLETE (UACMP) WITH MICROSCOPIC
Bacteria, UA: NONE SEEN
Specific Gravity, Urine: 1.016 (ref 1.005–1.030)

## 2018-07-03 LAB — URINE CULTURE

## 2018-07-06 ENCOUNTER — Other Ambulatory Visit: Payer: Self-pay

## 2018-07-06 DIAGNOSIS — R319 Hematuria, unspecified: Secondary | ICD-10-CM | POA: Diagnosis not present

## 2018-07-06 MED ORDER — CLONAZEPAM 1 MG PO TABS: 1.0000 mg | ORAL_TABLET | Freq: Every day | ORAL | 0 refills | Status: DC

## 2018-07-06 NOTE — Telephone Encounter (Signed)
Rx sent to [facility] phone : [phone] , fax : [phone]  

## 2018-07-08 LAB — URINE CULTURE: Culture: NO GROWTH

## 2018-07-13 ENCOUNTER — Non-Acute Institutional Stay (SKILLED_NURSING_FACILITY): Payer: Medicare Other | Admitting: Adult Health

## 2018-07-13 ENCOUNTER — Encounter: Payer: Self-pay | Admitting: Adult Health

## 2018-07-13 DIAGNOSIS — D693 Immune thrombocytopenic purpura: Secondary | ICD-10-CM

## 2018-07-13 DIAGNOSIS — K5904 Chronic idiopathic constipation: Secondary | ICD-10-CM

## 2018-07-13 DIAGNOSIS — F0391 Unspecified dementia with behavioral disturbance: Secondary | ICD-10-CM

## 2018-07-13 DIAGNOSIS — F03918 Unspecified dementia, unspecified severity, with other behavioral disturbance: Secondary | ICD-10-CM

## 2018-07-13 DIAGNOSIS — F339 Major depressive disorder, recurrent, unspecified: Secondary | ICD-10-CM

## 2018-07-13 DIAGNOSIS — G20 Parkinson's disease: Secondary | ICD-10-CM

## 2018-07-13 DIAGNOSIS — I4891 Unspecified atrial fibrillation: Secondary | ICD-10-CM | POA: Diagnosis not present

## 2018-07-13 DIAGNOSIS — I5022 Chronic systolic (congestive) heart failure: Secondary | ICD-10-CM | POA: Diagnosis not present

## 2018-07-13 DIAGNOSIS — F0281 Dementia in other diseases classified elsewhere with behavioral disturbance: Secondary | ICD-10-CM | POA: Diagnosis not present

## 2018-07-13 DIAGNOSIS — M48061 Spinal stenosis, lumbar region without neurogenic claudication: Secondary | ICD-10-CM

## 2018-07-13 DIAGNOSIS — G301 Alzheimer's disease with late onset: Secondary | ICD-10-CM

## 2018-07-13 DIAGNOSIS — L4050 Arthropathic psoriasis, unspecified: Secondary | ICD-10-CM | POA: Diagnosis not present

## 2018-07-13 DIAGNOSIS — I639 Cerebral infarction, unspecified: Secondary | ICD-10-CM | POA: Diagnosis not present

## 2018-07-13 DIAGNOSIS — F02818 Dementia in other diseases classified elsewhere, unspecified severity, with other behavioral disturbance: Secondary | ICD-10-CM

## 2018-07-13 DIAGNOSIS — G20A1 Parkinson's disease without dyskinesia, without mention of fluctuations: Secondary | ICD-10-CM

## 2018-07-13 NOTE — Progress Notes (Signed)
Location:   [facility] Room Number: 350K Place of Service:  SNF (31)   CODE STATUS: DNR  Allergies  Allergen Reactions  . Exelon [Rivastigmine Tartrate]     Increased agitation/irritability  . Other     Strawberries  . Penicillins     Chief Complaint  Patient presents with  . Medical Management of Chronic Issues    Afib; chf; constipation.     HPI:  He is a 84 year old long term resident of this facility beng seen for the management of his chronic illnesses: afib; chf; constipation. He denies any chest pain; shortness of breath or constipation. There are no nursing concerns at this time.   Past Medical History:  Diagnosis Date  . Anemia, unspecified    pernicious  . Anxiety    unspecified  . Arthritis   . Atrial fibrillation ([place])   . CHF (congestive heart failure) ([place]) 01/2006  . Kidney stones   . Nephrolithiasis   . Parkinson's disease ([place])   . Peripheral neuropathy   . Prostate cancer ([place]) 11/2006  . Psoriasis   . Psoriasis, unspecified   . Skin cancer   . Sleep disturbance    REM  . Spinal stenosis    s/p lumbar laminectomy  . Unspecified atrial fibrillation ([place])   . Upper GI bleed    unspecified; history of AVMs  . Valvular heart disease   . Vertigo     Past Surgical History:  Procedure Laterality Date  . CATARACT EXTRACTION    . EYE SURGERY    . INTERAL BLEEDING  10/2011   stomach  . MOHS SURGERY     Facial skin   . PROSTATE SURGERY    . SPINE SURGERY      Social History   Socioeconomic History  . Marital status: Widowed    Spouse name: Not on file  . Number of children: 2  . Years of education: College  . 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 Financial resource strain: Not on file  . Food insecurity:    Worry: Not on file    Inability: Not on file  . Transportation needs:    Medical: Not on file    Non-medical: Not on file    Tobacco Use  . Smoking status: Former Smoker    Types: Cigarettes    Last attempt to quit: 03/30/1986    Years since quitting: 32.3  . Smokeless tobacco: Never Used  Substance and Sexual Activity  . Alcohol use: No  . Drug use: No  . Sexual activity: Not on file  Lifestyle  . Physical activity:    Days per week: Not on file    Minutes per session: Not on file  . Stress: Not on file  Relationships  . Social connections:    Talks on phone: Not on file    Gets together: Not on file    Attends religious service: Not on file    Active member of club or organization: Not on file    Attends meetings of clubs or organizations: Not on file    Relationship status: Not on file  . Intimate partner violence:    Fear of current or ex partner: Not on file    Emotionally abused: Not on file    Physically abused: Not on file    Forced sexual activity: Not on file  Other Topics  Concern  . Not on file  Social History Narrative   Admit Date: 01/26/2016 to [facility]   Widowed   2 children   Denies alcohol use   Former smoker   DNR   Family History  Problem Relation Age of Onset  . Hypertension Mother   . Heart disease Father   . Heart disease Brother       VITAL SIGNS BP 126/67   Pulse 65   Temp 98.2 F (36.8 C) (Oral)   Resp 14   Ht 5\' 8"  (1.727 m)   Wt 132 lb 14.4 oz (60.3 kg)   SpO2 94%   BMI 20.21 kg/m   Outpatient Encounter Medications as of 07/13/2018  Medication Sig  . acetaminophen (TYLENOL) 325 MG tablet Take 650 mg by mouth every 4 (four) hours as needed. for pain/ increased temp. May be administered orally, per G-tube if needed or rectally if unable to swallow (separate order). Maximum of 3000 mg Acetaminophen in 24 hours.  Marland Kitchen acetaminophen (TYLENOL) 500 MG tablet Take 500 mg by mouth 2 (two) times daily. Maximum of 3000 mg of acetaminophen in 24 hours. Check prn acetaminophen order prior to administration due to limit.  Marland Kitchen alfuzosin (UROXATRAL) 10 MG 24 hr  tablet Take 10 mg by mouth daily.  Marland Kitchen aspirin 325 MG EC tablet Take 325 mg by mouth daily.  . bisacodyl (DULCOLAX) 10 MG suppository Place 10 mg rectally daily as needed for moderate constipation.  Marland Kitchen buPROPion (WELLBUTRIN) 100 MG tablet Take 100 mg by mouth 2 (two) times daily. Do not crush. (Major Depressive D/o, recurrent)  . carbidopa-levodopa (SINEMET CR) 50-200 MG tablet Take 1 tablet by mouth 2 (two) times daily.   . carbidopa-levodopa (SINEMET IR) 25-100 MG tablet Take 2 tablets by mouth 3 (three) times daily.   . clonazePAM (KLONOPIN) 0.5 MG tablet Take 0.5 mg by mouth daily.  . clonazePAM (KLONOPIN) 1 MG tablet Take 1 tablet (1 mg total) by mouth at bedtime.  . coal tar (NEUTROGENA T-GEL) 0.5 % shampoo Apply 1 application topically 2 (two) times daily. Enough to lather   . cyanocobalamin 1000 MCG tablet Take 1,000 mcg by mouth daily.  . hydrocortisone cream 1 % Apply thin film topically  to clean, dry hemorrhoids 4 times a day as needed  . magnesium hydroxide (MILK OF MAGNESIA) 400 MG/5ML suspension Take 30 mLs by mouth every 4 (four) hours as needed for mild constipation.  . Melatonin 3 MG TABS Take 3 mg by mouth at bedtime.   . midodrine (PROAMATINE) 2.5 MG tablet Take 2.5 mg by mouth 3 (three) times daily with meals. Take along with sinemet  . morphine (ROXANOL) 20 MG/ML concentrated solution Take 5-10 mg by mouth every hour as needed for severe pain.  Marland Kitchen NUTRITIONAL SUPPLEMENTS PO Give Magic Cup by mouth 2 times daily with lunch and supper tray.  . ondansetron (ZOFRAN) 4 MG tablet Take 4 mg by mouth every 4 (four) hours as needed for nausea or vomiting.  . Phenol 0.5 % LIQD Use as directed 2 sprays in the mouth or throat every 2 (two) hours as needed.  . polyethylene glycol (MIRALAX / GLYCOLAX) packet Take 17 g by mouth 2 (two) times daily. Mix with 4-6 ounces of fluid  . predniSONE (DELTASONE) 5 MG tablet Take 5 mg by mouth daily.   . Probiotic Product (RISA-BID PROBIOTIC) TABS Take 1  tablet by mouth 2 (two) times daily.  . QUEtiapine (SEROQUEL) 12.5 mg TABS tablet  Take 37.5 mg by mouth at bedtime. 3 tabs  --   agitation, paranoia, hypersexuality, psychosis, mania (GDR 05/04/18)  . QUEtiapine (SEROQUEL) 25 MG tablet Take 12.5 mg by mouth 2 (two) times daily. antipsychotic- give 12.5 mg ( 1/2 tab)at 8am and 8pm GDR  . sennosides-docusate sodium (SENOKOT-S) 8.6-50 MG tablet Take 2 tablets by mouth 3 (three) times daily.   . sertraline (ZOLOFT) 50 MG tablet Take 75 mg by mouth daily. 1 and 1/2 tablet For anxiety and depression (MDD, recurrent)  . Skin Protectants, Misc. (ENDIT EX) Apply liberal amount topically to areas of skin irritation 2 times daily and prn. Ok to leave at bedside.  . sorbitol 70 % solution Take 30 mLs by mouth daily as needed. Give every 2 hours until pt has good BM. Then may change order to prn.  . triamcinolone cream (KENALOG) 0.1 % Apply 1 application topically 2 (two) times daily as needed.  . trolamine salicylate (ASPERCREME) 10 % cream Apply thin film to the neck 3 times day prn  for pain  . UNABLE TO FIND Regular diet  . [DISCONTINUED] buPROPion (WELLBUTRIN XL) 150 MG 24 hr tablet Take 150 mg by mouth daily.   No facility-administered encounter medications on file as of 07/13/2018.      SIGNIFICANT DIAGNOSTIC EXAMS   LABS REVIEWED TODAY:   05-21-18: wbc 6.5; hgb 12.3; hct 36.2; mcv 105.6; plt 217; glucose 107; bun 13; creat 0.97; k+ 4.1 na++ 135 ca 8.9; liver normal albumin 4.3 PSA 0.03 07-06-18: urine culture: no growth   Review of Systems  Constitutional: Positive for malaise/fatigue.  Respiratory: Negative for cough and shortness of breath.   Cardiovascular: Negative for chest pain, palpitations and leg swelling.  Gastrointestinal: Negative for abdominal pain, constipation and heartburn.  Musculoskeletal: Negative for back pain, joint pain and myalgias.  Skin: Negative.   Neurological: Negative for dizziness.  Psychiatric/Behavioral: The  patient is not nervous/anxious.    Physical Exam  Constitutional: He appears well-developed and well-nourished. No distress.  Thin   Neck: No thyromegaly present.  Cardiovascular: Normal rate, regular rhythm, normal heart sounds and intact distal pulses.  Pulmonary/Chest: Effort normal and breath sounds normal. No respiratory distress.  Abdominal: Soft. Bowel sounds are normal. He exhibits no distension. There is no tenderness.  Musculoskeletal: Normal range of motion. He exhibits no edema.  Has bilateral hand tremor Abnormal gait   Lymphadenopathy:    He has no cervical adenopathy.  Neurological: He is alert.  Skin: Skin is warm and dry. He is not diaphoretic.  Psychiatric: He has a normal mood and affect.      ASSESSMENT/ PLAN:  TODAY:   1. Atrial fibrillation: heart rate stable; will continue asa 325 mg daily   2.  Chronic systolic congestive heart failure: is stable will continue to monitor his status.   3. Chronic idiopathic constipation: is stable will continue miralax twice daily and senna s 2 tabs three times daily   4.  Late onset alzheimer's disease with behavioral disturbance: is without change; weight is 132; pounds; will continue to monitor his status.   5. Cerebral infarction: is neurologically stable; will continue asa 325 mg daily   6. Parkinson's disease: is without change; will continue sinemet 50/200 mg 5 times daily   7.  Idiopathic thrombocytopentia purpura: is without change will continue prednisone 5 mg daily   8. Spinal stenosis lumbar region, without neurogenic claudication/ psoriatic arthritis: is stable will continue tylenol 500 mg twice daily has roxanol 5  or 10 mg every hours as needed for pain  9. Malignant neoplasm of prostate/BPH: PSA normal; will continue uroxatral 10 mg daily   10. Major depression disorder, recurrent: is stable will continue zoloft 75 mg daily wellbutrin 100 mg twice daily and is taking melatonin 3 mg nightly is taking  klonopin 0.5 mg in the AM and 1 mg in the PM for anxiety.   11. Psychosis in elderly with behavioral disturbance: has hallucinations; hypersexuality; agitation: is without change will continue seroquel 12.5 mg twice daily and 37.5 mg nightly     MD is aware of resident's narcotic use and is in agreement with current plan of care. We will attempt to wean resident as apropriate   Ok Edwards NP [facility]  Contact [phone] Monday through Friday 8am- 5pm  After hours call [phone]

## 2018-07-20 DIAGNOSIS — F0391 Unspecified dementia with behavioral disturbance: Secondary | ICD-10-CM | POA: Insufficient documentation

## 2018-07-20 DIAGNOSIS — F03918 Unspecified dementia, unspecified severity, with other behavioral disturbance: Secondary | ICD-10-CM | POA: Insufficient documentation

## 2018-07-24 ENCOUNTER — Non-Acute Institutional Stay (SKILLED_NURSING_FACILITY): Payer: Medicare Other | Admitting: Adult Health

## 2018-07-24 ENCOUNTER — Encounter: Payer: Self-pay | Admitting: Adult Health

## 2018-07-24 DIAGNOSIS — R310 Gross hematuria: Secondary | ICD-10-CM

## 2018-07-24 NOTE — Progress Notes (Signed)
Location:   [facility] Room Number: 517 Place of Service:  SNF (31)   CODE STATUS: DNR  Allergies  Allergen Reactions  . Exelon [Rivastigmine Tartrate]     Increased agitation/irritability  . Other     Strawberries  . Penicillins     Chief Complaint  Patient presents with  . Acute Visit    Hematuria    HPI:  Nursing staff reports that he is having hematuria. He has had urin cultures in the past without growth present. He tells me that he does have hematuria periodically; but does deny any pain. There are no reports of fevers present. He does have a history of afib and is on asa; will hold this medication at this time until seen by urology.    Past Medical History:  Diagnosis Date  . Anemia, unspecified    pernicious  . Anxiety    unspecified  . Arthritis   . Atrial fibrillation ([place])   . CHF (congestive heart failure) ([place]) 01/2006  . Kidney stones   . Nephrolithiasis   . Parkinson's disease ([place])   . Peripheral neuropathy   . Prostate cancer ([place]) 11/2006  . Psoriasis   . Psoriasis, unspecified   . Skin cancer   . Sleep disturbance    REM  . Spinal stenosis    s/p lumbar laminectomy  . Unspecified atrial fibrillation ([place])   . Upper GI bleed    unspecified; history of AVMs  . Valvular heart disease   . Vertigo     Past Surgical History:  Procedure Laterality Date  . CATARACT EXTRACTION    . EYE SURGERY    . INTERAL BLEEDING  10/2011   stomach  . MOHS SURGERY     Facial skin   . PROSTATE SURGERY    . SPINE SURGERY      Social History   Socioeconomic History  . Marital status: Widowed    Spouse name: Not on file  . Number of children: 2  . Years of education: College  . 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 Financial resource strain: Not on file  . Food insecurity:    Worry: Not on file    Inability: Not on file  . Transportation needs:   Medical: Not on file    Non-medical: Not on file  Tobacco Use  . Smoking status: Former Smoker    Types: Cigarettes    Last attempt to quit: 03/30/1986    Years since quitting: 32.3  . Smokeless tobacco: Never Used  Substance and Sexual Activity  . Alcohol use: No  . Drug use: No  . Sexual activity: Not on file  Lifestyle  . Physical activity:    Days per week: Not on file    Minutes per session: Not on file  . Stress: Not on file  Relationships  . Social connections:    Talks on phone: Not on file    Gets together: Not on file    Attends religious service: Not on file    Active member of club or organization: Not on file    Attends meetings of clubs or organizations: Not on file    Relationship status: Not on file  . Intimate partner violence:    Fear of current or ex partner: Not on file    Emotionally abused: Not on file    Physically abused: Not on file  Forced sexual activity: Not on file  Other Topics Concern  . Not on file  Social History Narrative   Admit Date: 01/26/2016 to [facility]   Widowed   2 children   Denies alcohol use   Former smoker   DNR   Family History  Problem Relation Age of Onset  . Hypertension Mother   . Heart disease Father   . Heart disease Brother       VITAL SIGNS BP 126/67   Pulse 65   Temp 98.2 F (36.8 C) (Oral)   Resp 14   Ht 5\' 8"  (1.727 m)   Wt 132 lb 14.4 oz (60.3 kg)   SpO2 94%   BMI 20.21 kg/m   Outpatient Encounter Medications as of 07/24/2018  Medication Sig  . acetaminophen (TYLENOL) 325 MG tablet Take 650 mg by mouth every 4 (four) hours as needed. for pain/ increased temp. May be administered orally, per G-tube if needed or rectally if unable to swallow (separate order). Maximum of 3000 mg Acetaminophen in 24 hours.  Marland Kitchen acetaminophen (TYLENOL) 500 MG tablet Take 500 mg by mouth 2 (two) times daily. Maximum of 3000 mg of acetaminophen in 24 hours. Check prn acetaminophen order prior to administration  due to limit.  Marland Kitchen alfuzosin (UROXATRAL) 10 MG 24 hr tablet Take 10 mg by mouth daily.  Marland Kitchen aspirin 325 MG EC tablet Take 325 mg by mouth daily.  . bisacodyl (DULCOLAX) 10 MG suppository Place 10 mg rectally daily as needed for moderate constipation.  Marland Kitchen buPROPion (WELLBUTRIN) 100 MG tablet Take 100 mg by mouth 2 (two) times daily. Do not crush. (Major Depressive D/o, recurrent)  . carbidopa-levodopa (SINEMET CR) 50-200 MG tablet Take 1 tablet by mouth 2 (two) times daily.   . carbidopa-levodopa (SINEMET IR) 25-100 MG tablet Take 2 tablets by mouth 3 (three) times daily.   . clonazePAM (KLONOPIN) 0.5 MG tablet Take 0.5 mg by mouth daily.  . clonazePAM (KLONOPIN) 1 MG tablet Take 1 tablet (1 mg total) by mouth at bedtime.  . coal tar (NEUTROGENA T-GEL) 0.5 % shampoo Apply 1 application topically 2 (two) times daily. Enough to lather   . cyanocobalamin 1000 MCG tablet Take 1,000 mcg by mouth daily.  . hydrocortisone cream 1 % Apply thin film topically  to clean, dry hemorrhoids 4 times a day as needed  . magnesium hydroxide (MILK OF MAGNESIA) 400 MG/5ML suspension Take 30 mLs by mouth every 4 (four) hours as needed for mild constipation.  . Melatonin 3 MG TABS Take 3 mg by mouth at bedtime.   . midodrine (PROAMATINE) 2.5 MG tablet Take 2.5 mg by mouth 3 (three) times daily with meals. Take along with sinemet  . morphine (ROXANOL) 20 MG/ML concentrated solution Take 5-10 mg by mouth every hour as needed for severe pain.  Marland Kitchen NUTRITIONAL SUPPLEMENTS PO Give Magic Cup by mouth 2 times daily with lunch and supper tray.  . ondansetron (ZOFRAN) 4 MG tablet Take 4 mg by mouth every 4 (four) hours as needed for nausea or vomiting.  . Phenol 0.5 % LIQD Use as directed 2 sprays in the mouth or throat every 2 (two) hours as needed.  . polyethylene glycol (MIRALAX / GLYCOLAX) packet Take 17 g by mouth 2 (two) times daily. Mix with 4-6 ounces of fluid  . predniSONE (DELTASONE) 5 MG tablet Take 5 mg by mouth daily.     . Probiotic Product (RISA-BID PROBIOTIC) TABS Take 1 tablet by mouth 2 (two)  times daily.  . QUEtiapine (SEROQUEL) 25 MG tablet Take 12.5 mg by mouth 2 (two) times daily. antipsychotic- give 12.5 mg ( 1/2 tab)at 8am and 8pm GDR  . sennosides-docusate sodium (SENOKOT-S) 8.6-50 MG tablet Take 2 tablets by mouth 3 (three) times daily.   . sertraline (ZOLOFT) 50 MG tablet Take 75 mg by mouth daily. 1 and 1/2 tablet For anxiety and depression (MDD, recurrent)  . Skin Protectants, Misc. (ENDIT EX) Apply liberal amount topically to areas of skin irritation 2 times daily and prn. Ok to leave at bedside.  . sorbitol 70 % solution Take 30 mLs by mouth daily as needed. Give every 2 hours until pt has good BM. Then may change order to prn.  . triamcinolone cream (KENALOG) 0.1 % Apply 1 application topically 2 (two) times daily as needed.  . trolamine salicylate (ASPERCREME) 10 % cream Apply thin film to the neck 3 times day prn  for pain  . UNABLE TO FIND Regular diet  . [DISCONTINUED] QUEtiapine (SEROQUEL) 12.5 mg TABS tablet Take 37.5 mg by mouth at bedtime. 3 tabs  --   agitation, paranoia, hypersexuality, psychosis, mania (GDR 05/04/18)   No facility-administered encounter medications on file as of 07/24/2018.      SIGNIFICANT DIAGNOSTIC EXAMS   LABS REVIEWED PREVIOUS:   05-21-18: wbc 6.5; hgb 12.3; hct 36.2; mcv 105.6; plt 217; glucose 107; bun 13; creat 0.97; k+ 4.1 na++ 135 ca 8.9; liver normal albumin 4.3 PSA 0.03 07-06-18: urine culture: no growth  NO NEW LABS.     Review of Systems  Constitutional: Negative for malaise/fatigue.  Respiratory: Negative for cough and shortness of breath.   Cardiovascular: Negative for chest pain, palpitations and leg swelling.  Gastrointestinal: Negative for abdominal pain, constipation and heartburn.  Genitourinary: Positive for hematuria.       Is painless   Musculoskeletal: Negative for back pain, joint pain and myalgias.  Skin: Negative.   Neurological:  Negative for dizziness.  Psychiatric/Behavioral: The patient is not nervous/anxious.     Physical Exam  Constitutional: He appears well-developed and well-nourished. No distress.  Thin   Neck: No thyromegaly present.  Cardiovascular: Normal rate, regular rhythm, normal heart sounds and intact distal pulses.  Pulmonary/Chest: Effort normal and breath sounds normal. No respiratory distress.  Abdominal: Soft. Bowel sounds are normal. He exhibits no distension. There is no tenderness.  Musculoskeletal: Normal range of motion. He exhibits no edema.  Bilateral hand tremor Uses wheelchair   Lymphadenopathy:    He has no cervical adenopathy.  Neurological: He is alert.  Skin: Skin is warm and dry. He is not diaphoretic.  Psychiatric: He has a normal mood and affect.     ASSESSMENT/ PLAN:  TODAY:   1. Hematuria, gross: is worse: due to his history of painless hematuria with negative urine culture: will set up urology consult and will stop asa at this time. Will monitor    MD is aware of resident's narcotic use and is in agreement with current plan of care. We will attempt to wean resident as apropriate   Ok Edwards NP [facility]  Contact [phone] Monday through Friday 8am- 5pm  After hours call [phone]

## 2018-07-27 ENCOUNTER — Other Ambulatory Visit
Admission: RE | Admit: 2018-07-27 | Discharge: 2018-07-27 | Disposition: A | Discharge status: Home / Self Care | Payer: Medicare Other | Source: Ambulatory Visit | Attending: Internal Medicine | Admitting: Internal Medicine

## 2018-07-27 DIAGNOSIS — Z8546 Personal history of malignant neoplasm of prostate: Secondary | ICD-10-CM | POA: Insufficient documentation

## 2018-07-27 LAB — CBC
HCT: 31.1 % — ABNORMAL LOW (ref 40.0–52.0)
Hemoglobin: 10.7 g/dL — ABNORMAL LOW (ref 13.0–18.0)
MCH: 35.3 pg — ABNORMAL HIGH (ref 26.0–34.0)
MCHC: 34.5 g/dL (ref 32.0–36.0)
MCV: 102.4 fL — AB (ref 80.0–100.0)
PLATELETS: 215 10*3/uL (ref 150–440)
RBC: 3.03 MIL/uL — ABNORMAL LOW (ref 4.40–5.90)
RDW: 12.5 % (ref 11.5–14.5)
WBC: 4.7 10*3/uL (ref 3.8–10.6)

## 2018-07-30 ENCOUNTER — Encounter
Admission: RE | Admit: 2018-07-30 | Discharge: 2018-07-30 | Disposition: A | Discharge status: Home / Self Care | Payer: Medicare Other | Source: Ambulatory Visit | Attending: Internal Medicine | Admitting: Internal Medicine

## 2018-07-30 DIAGNOSIS — R319 Hematuria, unspecified: Secondary | ICD-10-CM | POA: Insufficient documentation

## 2018-08-01 DIAGNOSIS — R310 Gross hematuria: Secondary | ICD-10-CM | POA: Insufficient documentation

## 2018-08-03 ENCOUNTER — Other Ambulatory Visit: Payer: Self-pay

## 2018-08-03 MED ORDER — LORAZEPAM 0.5 MG PO TABS: 0.5000 mg | ORAL_TABLET | Freq: Four times a day (QID) | ORAL | 0 refills | Status: AC | PRN

## 2018-08-03 NOTE — Telephone Encounter (Signed)
Rx sent to [facility] phone : [phone] , fax : [phone]  

## 2018-08-04 ENCOUNTER — Other Ambulatory Visit
Admission: RE | Admit: 2018-08-04 | Discharge: 2018-08-04 | Disposition: A | Discharge status: Home / Self Care | Payer: Medicare Other | Source: Ambulatory Visit | Attending: Internal Medicine | Admitting: Internal Medicine

## 2018-08-04 DIAGNOSIS — R58 Hemorrhage, not elsewhere classified: Secondary | ICD-10-CM | POA: Diagnosis not present

## 2018-08-04 DIAGNOSIS — D649 Anemia, unspecified: Secondary | ICD-10-CM | POA: Diagnosis present

## 2018-08-04 LAB — CBC WITH DIFFERENTIAL/PLATELET
Basophils Absolute: 0 10*3/uL (ref 0–0.1)
Basophils Relative: 1 %
EOS ABS: 0.3 10*3/uL (ref 0–0.7)
Eosinophils Relative: 5 %
HEMATOCRIT: 33.1 % — AB (ref 40.0–52.0)
HEMOGLOBIN: 11.3 g/dL — AB (ref 13.0–18.0)
LYMPHS ABS: 1.3 10*3/uL (ref 1.0–3.6)
Lymphocytes Relative: 25 %
MCH: 34.2 pg — AB (ref 26.0–34.0)
MCHC: 34 g/dL (ref 32.0–36.0)
MCV: 100.6 fL — AB (ref 80.0–100.0)
MONOS PCT: 13 %
Monocytes Absolute: 0.7 10*3/uL (ref 0.2–1.0)
NEUTROS PCT: 56 %
Neutro Abs: 2.9 10*3/uL (ref 1.4–6.5)
Platelets: 233 10*3/uL (ref 150–440)
RBC: 3.29 MIL/uL — ABNORMAL LOW (ref 4.40–5.90)
RDW: 12.8 % (ref 11.5–14.5)
WBC: 5.2 10*3/uL (ref 3.8–10.6)

## 2018-08-06 ENCOUNTER — Encounter: Payer: Self-pay | Admitting: Urology

## 2018-08-06 ENCOUNTER — Ambulatory Visit (INDEPENDENT_AMBULATORY_CARE_PROVIDER_SITE_OTHER): Payer: Medicare Other | Admitting: Urology

## 2018-08-06 VITALS — BP 127/68 | HR 87 | Wt 147.0 lb

## 2018-08-06 DIAGNOSIS — R3129 Other microscopic hematuria: Secondary | ICD-10-CM

## 2018-08-06 DIAGNOSIS — N200 Calculus of kidney: Secondary | ICD-10-CM

## 2018-08-06 DIAGNOSIS — Z8546 Personal history of malignant neoplasm of prostate: Secondary | ICD-10-CM

## 2018-08-06 DIAGNOSIS — R310 Gross hematuria: Secondary | ICD-10-CM | POA: Diagnosis not present

## 2018-08-06 DIAGNOSIS — I639 Cerebral infarction, unspecified: Secondary | ICD-10-CM | POA: Diagnosis not present

## 2018-08-06 LAB — URINALYSIS, COMPLETE
Bilirubin, UA: NEGATIVE
Glucose, UA: NEGATIVE
Nitrite, UA: NEGATIVE
Specific Gravity, UA: 1.02 (ref 1.005–1.030)
UUROB: 0.2 mg/dL (ref 0.2–1.0)
pH, UA: 6 (ref 5.0–7.5)

## 2018-08-06 LAB — MICROSCOPIC EXAMINATION: EPITHELIAL CELLS (NON RENAL): NONE SEEN /HPF (ref 0–10)

## 2018-08-06 NOTE — Progress Notes (Signed)
08/06/2018 3:14 PM   [name] [name] 02/13/1930 [id number]  Referring provider: Kirk Ruths, MD [facility] [place], [place] [address]  Chief Complaint  Patient presents with  . Hematuria    HPI:  Patient returns for gross hematuria. This was noted recently at patient's SNF. Pt reports red urine, no clots. His sitter hasn't seen the urine and couldn't contribute to the history. He is on ASA for a fib. His UA today shows a few bacteria, >30 rbc's / hpf. 0-5 wbc. Trace LE and Nit negative. His 08/04/2018 hgb was 11.3 and hct 33.1 -- stable. Urine cx mixed growth and then no growth. Last Cr 0.97 and PSA 0.01 May 2018.   He had an episode of gross hematuria in 2016 but declined imaging and cystoscopy. He underwent CT in 2015 which by report showed showed bilateral nephrolithiasis measuring up to 9 mm as well as multiple Bosniak 1 cyst. There was also evidence of prostatic enlargement. He denies flank pain or stone passage.   He is on Uroxatral. He reports he is voiding without difficulty.  Patient has a history of prostate cancer treated with radiation in 2004. His August 2015 PSA was less than 0.1. May 2019 PSA was 0.03.   PMH: Past Medical History:  Diagnosis Date  . Anemia, unspecified    pernicious  . Anxiety    unspecified  . Arthritis   . Atrial fibrillation ([place])   . CHF (congestive heart failure) ([place]) 01/2006  . Kidney stones   . Nephrolithiasis   . Parkinson's disease ([place])   . Peripheral neuropathy   . Prostate cancer ([place]) 11/2006  . Psoriasis   . Psoriasis, unspecified   . Skin cancer   . Sleep disturbance    REM  . Spinal stenosis    s/p lumbar laminectomy  . Unspecified atrial fibrillation ([place])   . Upper GI bleed    unspecified; history of AVMs  . Valvular heart disease   . Vertigo     Surgical History: Past Surgical History:  Procedure Laterality Date  . CATARACT EXTRACTION    . EYE SURGERY    . INTERAL  BLEEDING  10/2011   stomach  . MOHS SURGERY     Facial skin   . PROSTATE SURGERY    . SPINE SURGERY      Home Medications:  Allergies as of 08/06/2018      Reactions   Exelon [rivastigmine Tartrate]    Increased agitation/irritability   Other    Strawberries   Penicillins       Medication List        Accurate as of 08/06/18  3:14 PM. Always use your most recent med list.          acetaminophen 325 MG tablet Commonly known as:  TYLENOL Take 650 mg by mouth every 4 (four) hours as needed. for pain/ increased temp. May be administered orally, per G-tube if needed or rectally if unable to swallow (separate order). Maximum of 3000 mg Acetaminophen in 24 hours.   acetaminophen 500 MG tablet Commonly known as:  TYLENOL Take 500 mg by mouth 2 (two) times daily. Maximum of 3000 mg of acetaminophen in 24 hours. Check prn acetaminophen order prior to administration due to limit.   alfuzosin 10 MG 24 hr tablet Commonly known as:  UROXATRAL Take 10 mg by mouth daily.   aspirin 325 MG EC tablet Take 325 mg by mouth daily.   bisacodyl  10 MG suppository Commonly known as:  DULCOLAX Place 10 mg rectally daily as needed for moderate constipation.   buPROPion 100 MG tablet Commonly known as:  WELLBUTRIN Take 100 mg by mouth 2 (two) times daily. Do not crush. (Major Depressive D/o, recurrent)   carbidopa-levodopa 50-200 MG tablet Commonly known as:  SINEMET CR Take 1 tablet by mouth 2 (two) times daily.   carbidopa-levodopa 25-100 MG tablet Commonly known as:  SINEMET IR Take 2 tablets by mouth 3 (three) times daily.   clonazePAM 0.5 MG tablet Commonly known as:  KLONOPIN Take 0.5 mg by mouth daily.   clonazePAM 1 MG tablet Commonly known as:  KLONOPIN Take 1 tablet (1 mg total) by mouth at bedtime.   coal tar 0.5 % shampoo Commonly known as:  NEUTROGENA T-GEL Apply 1 application topically 2 (two) times daily. Enough to lather   cyanocobalamin 1000 MCG tablet Take 1,000  mcg by mouth daily.   ENDIT EX Apply liberal amount topically to areas of skin irritation 2 times daily and prn. Ok to leave at bedside.   hydrocortisone cream 1 % Apply thin film topically  to clean, dry hemorrhoids 4 times a day as needed   LORazepam 0.5 MG tablet Commonly known as:  ATIVAN Take 1 tablet (0.5 mg total) by mouth every 6 (six) hours as needed for up to 14 days for anxiety.   magnesium hydroxide 400 MG/5ML suspension Commonly known as:  MILK OF MAGNESIA Take 30 mLs by mouth every 4 (four) hours as needed for mild constipation.   Melatonin 3 MG Tabs Take 3 mg by mouth at bedtime.   midodrine 2.5 MG tablet Commonly known as:  PROAMATINE Take 2.5 mg by mouth 3 (three) times daily with meals. Take along with sinemet   morphine 20 MG/ML concentrated solution Commonly known as:  ROXANOL Take 5-10 mg by mouth every hour as needed for severe pain.   NUTRITIONAL SUPPLEMENTS PO Give Magic Cup by mouth 2 times daily with lunch and supper tray.   ondansetron 4 MG tablet Commonly known as:  ZOFRAN Take 4 mg by mouth every 4 (four) hours as needed for nausea or vomiting.   Phenol 0.5 % Liqd Use as directed 2 sprays in the mouth or throat every 2 (two) hours as needed.   polyethylene glycol packet Commonly known as:  MIRALAX / GLYCOLAX Take 17 g by mouth 2 (two) times daily. Mix with 4-6 ounces of fluid   predniSONE 5 MG tablet Commonly known as:  DELTASONE Take 5 mg by mouth daily.   QUEtiapine 25 MG tablet Commonly known as:  SEROQUEL Take 12.5 mg by mouth 2 (two) times daily. antipsychotic- give 12.5 mg ( 1/2 tab)at 8am and 8pm GDR   RISA-BID PROBIOTIC Tabs Take 1 tablet by mouth 2 (two) times daily.   sennosides-docusate sodium 8.6-50 MG tablet Commonly known as:  SENOKOT-S Take 2 tablets by mouth 3 (three) times daily.   sertraline 50 MG tablet Commonly known as:  ZOLOFT Take 75 mg by mouth daily. 1 and 1/2 tablet For anxiety and depression (MDD,  recurrent)   sorbitol 70 % solution Take 30 mLs by mouth daily as needed. Give every 2 hours until pt has good BM. Then may change order to prn.   triamcinolone cream 0.1 % Commonly known as:  KENALOG Apply 1 application topically 2 (two) times daily as needed.   trolamine salicylate 10 % cream Commonly known as:  ASPERCREME Apply thin film to the  neck 3 times day prn  for pain   UNABLE TO FIND Regular diet       Allergies:  Allergies  Allergen Reactions  . Exelon [Rivastigmine Tartrate]     Increased agitation/irritability  . Other     Strawberries  . Penicillins     Family History: Family History  Problem Relation Age of Onset  . Hypertension Mother   . Heart disease Father   . Heart disease Brother     Social History:  reports that he quit smoking about 32 years ago. His smoking use included cigarettes. He has never used smokeless tobacco. He reports that he does not drink alcohol or use drugs.  ROS:                                        Physical Exam: BP 127/68   Pulse 87   Wt 66.7 kg   BMI 22.35 kg/m   Constitutional:  Alert and oriented, No acute distress. HEENT: [place] AT, moist mucus membranes.  Trachea midline, no masses. Cardiovascular: No clubbing, cyanosis, or edema. Respiratory: Normal respiratory effort, no increased work of breathing. GI: Abdomen is soft, nontender, nondistended, no abdominal masses GU: No CVA tenderness Skin: No rashes, bruises or suspicious lesions. Neurologic: Grossly intact, no focal deficits, moving all 4 extremities. Psychiatric: Normal mood and affect.  Laboratory Data: Lab Results  Component Value Date   WBC 5.2 08/04/2018   HGB 11.3 (L) 08/04/2018   HCT 33.1 (L) 08/04/2018   MCV 100.6 (H) 08/04/2018   PLT 233 08/04/2018    Lab Results  Component Value Date   CREATININE 0.97 05/21/2018    No results found for: PSA  No results found for: TESTOSTERONE  No results found for:  HGBA1C  Urinalysis    Component Value Date/Time   COLORURINE RED (A) 07/02/2018 0700   APPEARANCEUR CLOUDY (A) 07/02/2018 0700   APPEARANCEUR Clear 12/18/2015 1508   LABSPEC 1.016 07/02/2018 0700   PHURINE  07/02/2018 0700    TEST NOT REPORTED DUE TO COLOR INTERFERENCE OF URINE PIGMENT   GLUCOSEU (A) 07/02/2018 0700    TEST NOT REPORTED DUE TO COLOR INTERFERENCE OF URINE PIGMENT   HGBUR (A) 07/02/2018 0700    TEST NOT REPORTED DUE TO COLOR INTERFERENCE OF URINE PIGMENT   BILIRUBINUR (A) 07/02/2018 0700    TEST NOT REPORTED DUE TO COLOR INTERFERENCE OF URINE PIGMENT   BILIRUBINUR Negative 12/18/2015 1508   KETONESUR (A) 07/02/2018 0700    TEST NOT REPORTED DUE TO COLOR INTERFERENCE OF URINE PIGMENT   PROTEINUR (A) 07/02/2018 0700    TEST NOT REPORTED DUE TO COLOR INTERFERENCE OF URINE PIGMENT   NITRITE (A) 07/02/2018 0700    TEST NOT REPORTED DUE TO COLOR INTERFERENCE OF URINE PIGMENT   LEUKOCYTESUR (A) 07/02/2018 0700    TEST NOT REPORTED DUE TO COLOR INTERFERENCE OF URINE PIGMENT   LEUKOCYTESUR Trace (A) 12/18/2015 1508    Lab Results  Component Value Date   LABMICR See below: 12/18/2015   WBCUA 0-5 12/18/2015   RBCUA None seen 12/18/2015   LABEPIT None seen 12/18/2015   MUCUS Present (A) 12/18/2015   BACTERIA NONE SEEN 07/02/2018     No results found for this or any previous visit. No results found for this or any previous visit. No results found for this or any previous visit. No results found for this or any  previous visit. No results found for this or any previous visit. No results found for this or any previous visit. No results found for this or any previous visit. No results found for this or any previous visit.  Assessment & Plan:    1. Microscopic hematuria and gross hematuria - discussed with patient the nature of CT scan and cystoscopy and he elects to proceed.  - Urinalysis, Complete  2. Kidney stones - as above  3. H/o PCa - PSA low   No  follow-ups on file.  Festus Aloe, MD  [facility] [address] [place], [place] [address] [phone]

## 2018-08-17 ENCOUNTER — Other Ambulatory Visit: Payer: Self-pay

## 2018-08-17 MED ORDER — CLONAZEPAM 0.5 MG PO TABS: 0.5000 mg | ORAL_TABLET | Freq: Every day | ORAL | 0 refills | Status: DC

## 2018-08-17 NOTE — Telephone Encounter (Signed)
Rx sent to [facility] phone : [phone] , fax : [phone]  

## 2018-08-25 ENCOUNTER — Ambulatory Visit
Admission: RE | Admit: 2018-08-25 | Discharge: 2018-08-25 | Disposition: A | Discharge status: Home / Self Care | Payer: Medicare Other | Source: Ambulatory Visit | Attending: Urology | Admitting: Urology

## 2018-08-25 DIAGNOSIS — I700 Atherosclerosis of aorta: Secondary | ICD-10-CM | POA: Insufficient documentation

## 2018-08-25 DIAGNOSIS — R3129 Other microscopic hematuria: Secondary | ICD-10-CM | POA: Diagnosis present

## 2018-08-25 DIAGNOSIS — N200 Calculus of kidney: Secondary | ICD-10-CM

## 2018-08-26 ENCOUNTER — Encounter: Payer: Self-pay | Admitting: Adult Health

## 2018-08-26 ENCOUNTER — Non-Acute Institutional Stay (SKILLED_NURSING_FACILITY): Payer: Medicare Other | Admitting: Adult Health

## 2018-08-26 DIAGNOSIS — G301 Alzheimer's disease with late onset: Secondary | ICD-10-CM

## 2018-08-26 DIAGNOSIS — I4891 Unspecified atrial fibrillation: Secondary | ICD-10-CM

## 2018-08-26 DIAGNOSIS — K5904 Chronic idiopathic constipation: Secondary | ICD-10-CM

## 2018-08-26 DIAGNOSIS — I5022 Chronic systolic (congestive) heart failure: Secondary | ICD-10-CM

## 2018-08-26 DIAGNOSIS — F02818 Dementia in other diseases classified elsewhere, unspecified severity, with other behavioral disturbance: Secondary | ICD-10-CM

## 2018-08-26 DIAGNOSIS — F0281 Dementia in other diseases classified elsewhere with behavioral disturbance: Secondary | ICD-10-CM

## 2018-08-26 NOTE — Progress Notes (Signed)
Location:   [facility] Room Number: 956O Place of Service:  SNF ([id number])   CODE STATUS: DNR  Allergies  Allergen Reactions  . Exelon [Rivastigmine Tartrate]     Increased agitation/irritability  . Other     Strawberries  . Penicillins     Chief Complaint  Patient presents with  . Medical Management of Chronic Issues    Afib; chf; constipation; alzheimer's     HPI:  He is a 84 year old long term resident of this facility being seen for the management of his chronic illnesses: afib; chf; constipation; alzheimer's disease.  He denies hematuria at this time. He is followed by urology. He denies any anxiety; no changes in appetite. There are no nursing concerns at this time.   Past Medical History:  Diagnosis Date  . Anemia, unspecified    pernicious  . Anxiety    unspecified  . Arthritis   . Atrial fibrillation ([place])   . CHF (congestive heart failure) ([place]) 01/2006  . Kidney stones   . Nephrolithiasis   . Parkinson's disease ([place])   . Peripheral neuropathy   . Prostate cancer ([place]) 11/2006  . Psoriasis   . Psoriasis, unspecified   . Skin cancer   . Sleep disturbance    REM  . Spinal stenosis    s/p lumbar laminectomy  . Unspecified atrial fibrillation ([place])   . Upper GI bleed    unspecified; history of AVMs  . Valvular heart disease   . Vertigo     Past Surgical History:  Procedure Laterality Date  . CATARACT EXTRACTION    . EYE SURGERY    . INTERAL BLEEDING  10/2011   stomach  . MOHS SURGERY     Facial skin   . PROSTATE SURGERY    . SPINE SURGERY      Social History   Socioeconomic History  . Marital status: Widowed    Spouse name: Not on file  . Number of children: 2  . Years of education: College  . 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 Financial resource strain: Not on file  . Food insecurity:    Worry: Not on file    Inability: Not on file  .  Transportation needs:    Medical: Not on file    Non-medical: Not on file  Tobacco Use  . Smoking status: Former Smoker    Types: Cigarettes    Last attempt to quit: 03/30/1986    Years since quitting: 32.4  . Smokeless tobacco: Never Used  Substance and Sexual Activity  . Alcohol use: No  . Drug use: No  . Sexual activity: Not on file  Lifestyle  . Physical activity:    Days per week: Not on file    Minutes per session: Not on file  . Stress: Not on file  Relationships  . Social connections:    Talks on phone: Not on file    Gets together: Not on file    Attends religious service: Not on file    Active member of club or organization: Not on file    Attends meetings of clubs or organizations: Not on file    Relationship status: Not on file  . Intimate partner violence:    Fear of current or ex partner: Not on file    Emotionally abused: Not on file    Physically abused: Not on file  Forced sexual activity: Not on file  Other Topics Concern  . Not on file  Social History Narrative   Admit Date: 01/26/2016 to [facility]   Widowed   2 children   Denies alcohol use   Former smoker   DNR   Family History  Problem Relation Age of Onset  . Hypertension Mother   . Heart disease Father   . Heart disease Brother       VITAL SIGNS BP (!) 148/83   Pulse 78   Temp 98.2 F (36.8 C) (Oral)   Resp 20   Ht 5\' 8"  (1.727 m)   Wt 145 lb 8 oz (66 kg)   SpO2 94%   BMI 22.12 kg/m   Outpatient Encounter Medications as of 08/26/2018  Medication Sig  . acetaminophen (TYLENOL) 325 MG tablet Take 650 mg by mouth every 4 (four) hours as needed. for pain/ increased temp. May be administered orally, per G-tube if needed or rectally if unable to swallow (separate order). Maximum of 3000 mg Acetaminophen in 24 hours.  Marland Kitchen acetaminophen (TYLENOL) 500 MG tablet Take 500 mg by mouth 2 (two) times daily. Maximum of 3000 mg of acetaminophen in 24 hours. Check prn acetaminophen order  prior to administration due to limit.  Marland Kitchen alfuzosin (UROXATRAL) 10 MG 24 hr tablet Take 10 mg by mouth daily.  . bisacodyl (DULCOLAX) 10 MG suppository Place 10 mg rectally daily as needed for moderate constipation.  Marland Kitchen buPROPion (WELLBUTRIN) 100 MG tablet Take 100 mg by mouth 2 (two) times daily. Do not crush. (Major Depressive D/o, recurrent)  . carbidopa-levodopa (SINEMET CR) 50-200 MG tablet Take 1 tablet by mouth 2 (two) times daily.   . carbidopa-levodopa (SINEMET IR) 25-100 MG tablet Take 2 tablets by mouth 3 (three) times daily.   . clonazePAM (KLONOPIN) 0.5 MG tablet Take 0.5 mg by mouth daily.  . clonazePAM (KLONOPIN) 0.5 MG tablet Take 1 tablet (0.5 mg total) by mouth at bedtime.  . coal tar (NEUTROGENA T-GEL) 0.5 % shampoo Apply 1 application topically 2 (two) times a week. Enough to lather   . cyanocobalamin 1000 MCG tablet Take 1,000 mcg by mouth daily.  . hydrocortisone cream 1 % Apply thin film topically  to clean, dry hemorrhoids 4 times a day as needed  . magnesium hydroxide (MILK OF MAGNESIA) 400 MG/5ML suspension Take 30 mLs by mouth every 4 (four) hours as needed for mild constipation.  . Melatonin 3 MG TABS Take 3 mg by mouth at bedtime.   . midodrine (PROAMATINE) 2.5 MG tablet Take 2.5 mg by mouth 3 (three) times daily with meals. Take along with sinemet  . morphine (ROXANOL) 20 MG/ML concentrated solution Take 5-10 mg by mouth every hour as needed for severe pain. 0.25 mL for mild to moderate pain and/or dyspnea. 0.50 mL for moderate to severe pain and/or dyspnea.  . NUTRITIONAL SUPPLEMENTS PO Give Magic Cup by mouth 2 times daily with lunch and supper tray.  . ondansetron (ZOFRAN) 4 MG tablet Take 4 mg by mouth every 4 (four) hours as needed for nausea or vomiting.  . Phenol 0.5 % LIQD Use as directed 2 sprays in the mouth or throat every 2 (two) hours as needed.  . polyethylene glycol (MIRALAX / GLYCOLAX) packet Take 17 g by mouth 2 (two) times daily. Mix with 4-6 ounces of  fluid  . predniSONE (DELTASONE) 5 MG tablet Take 5 mg by mouth daily.   . Probiotic Product (RISA-BID PROBIOTIC) TABS Take  1 tablet by mouth 2 (two) times daily.  . sennosides-docusate sodium (SENOKOT-S) 8.6-50 MG tablet Take 2 tablets by mouth 3 (three) times daily.   . sertraline (ZOLOFT) 50 MG tablet Take 75 mg by mouth daily. 1 and 1/2 tablet For anxiety and depression (MDD, recurrent)  . Skin Protectants, Misc. (ENDIT EX) Apply liberal amount topically to areas of skin irritation 2 times daily and prn. Ok to leave at bedside.  . sorbitol 70 % solution Take 30 mLs by mouth daily as needed. Give every 2 hours until pt has good BM. Then may change order to prn.  . triamcinolone cream (KENALOG) 0.1 % Apply 1 application topically 2 (two) times daily as needed.  . trolamine salicylate (ASPERCREME) 10 % cream Apply thin film to the neck 3 times day prn  for pain  . UNABLE TO FIND Regular diet  . [DISCONTINUED] aspirin 325 MG EC tablet Take 325 mg by mouth daily.  . [DISCONTINUED] QUEtiapine (SEROQUEL) 25 MG tablet Take 12.5 mg by mouth 2 (two) times daily. antipsychotic- give 12.5 mg ( 1/2 tab)at 8am and 8pm GDR   No facility-administered encounter medications on file as of 08/26/2018.      SIGNIFICANT DIAGNOSTIC EXAMS  LABS REVIEWED PREVIOUS:   05-21-18: wbc 6.5; hgb 12.3; hct 36.2; mcv 105.6; plt 217; glucose 107; bun 13; creat 0.97; k+ 4.1 na++ 135 ca 8.9; liver normal albumin 4.3 PSA 0.03 07-06-18: urine culture: no growth  NO NEW LABS.     Review of Systems  Constitutional: Negative for malaise/fatigue.  Respiratory: Negative for cough and shortness of breath.   Cardiovascular: Negative for chest pain, palpitations and leg swelling.  Gastrointestinal: Negative for abdominal pain, constipation and heartburn.  Genitourinary:       No hematuria   Musculoskeletal: Negative for back pain, joint pain and myalgias.  Skin: Negative.   Neurological: Negative for dizziness.    Psychiatric/Behavioral: The patient is not nervous/anxious.     Physical Exam  Constitutional: He appears well-developed and well-nourished. No distress.  Thin   Neck: No thyromegaly present.  Cardiovascular: Normal rate, regular rhythm, normal heart sounds and intact distal pulses.  Pulmonary/Chest: Effort normal and breath sounds normal. No respiratory distress.  Abdominal: Soft. Bowel sounds are normal. He exhibits no distension. There is no tenderness.  Musculoskeletal: He exhibits no edema.  Bilateral hand tremor Uses wheelchair    Lymphadenopathy:    He has no cervical adenopathy.  Neurological: He is alert.  Skin: Skin is warm and dry. He is not diaphoretic.  Psychiatric: He has a normal mood and affect.    ASSESSMENT/ PLAN:   TODAY:   1. Atrial fibrillation: heart rate stable; will continue asa 325 mg daily   2.  Chronic systolic congestive heart failure: is stable will continue to monitor his status.   3. Chronic idiopathic constipation: is stable will continue miralax twice daily and senna s 2 tabs three times daily   4.  Late onset alzheimer's disease with behavioral disturbance: is without change; weight is 145; pounds; will continue to monitor his status.   PREVIOUS  5. Cerebral infarction: is neurologically stable; will restart asa at 81 mg daily    6. Parkinson's disease: is without change; will continue sinemet 50/200 mg 2 tabs  3 times daily   7.  Idiopathic thrombocytopentia purpura: is without change will continue prednisone 5 mg daily   8. Spinal stenosis lumbar region, without neurogenic claudication/ psoriatic arthritis: is stable will continue tylenol 500  mg twice daily has roxanol 5 or 10 mg every hours as needed for pain  9. Malignant neoplasm of prostate/BPH: PSA normal; will continue uroxatral 10 mg daily   10. Major depression disorder, recurrent: is stable will continue zoloft 75 mg daily wellbutrin 100 mg twice daily and is taking melatonin  3 mg nightly is taking klonopin 0.5 mg twice daily for anxiety.   11. Psychosis in elderly with behavioral disturbance: has had hallucinations; hypersexuality; agitation: is stable is presently off seroquel at this time.      MD is aware of resident's narcotic use and is in agreement with current plan of care. We will attempt to wean resident as apropriate   Ok Edwards NP [facility]  Contact [phone] Monday through Friday 8am- 5pm  After hours call [phone]

## 2018-08-30 ENCOUNTER — Encounter
Admission: RE | Admit: 2018-08-30 | Discharge: 2018-08-30 | Disposition: A | Discharge status: Home / Self Care | Payer: Medicare Other | Source: Ambulatory Visit | Attending: Internal Medicine | Admitting: Internal Medicine

## 2018-08-30 DIAGNOSIS — R319 Hematuria, unspecified: Secondary | ICD-10-CM | POA: Insufficient documentation

## 2018-09-01 ENCOUNTER — Other Ambulatory Visit
Admission: RE | Admit: 2018-09-01 | Discharge: 2018-09-01 | Disposition: A | Discharge status: Home / Self Care | Payer: Medicare Other | Source: Ambulatory Visit | Attending: Internal Medicine | Admitting: Internal Medicine

## 2018-09-01 DIAGNOSIS — F0281 Dementia in other diseases classified elsewhere with behavioral disturbance: Secondary | ICD-10-CM | POA: Diagnosis present

## 2018-09-01 LAB — URINALYSIS, COMPLETE (UACMP) WITH MICROSCOPIC
BILIRUBIN URINE: NEGATIVE
Bacteria, UA: NONE SEEN
Glucose, UA: NEGATIVE mg/dL
Ketones, ur: 5 mg/dL — AB
LEUKOCYTES UA: NEGATIVE
NITRITE: NEGATIVE
PH: 6 (ref 5.0–8.0)
Protein, ur: NEGATIVE mg/dL
RBC / HPF: >50 RBC/hpf — ABNORMAL HIGH (ref 0–5)
SPECIFIC GRAVITY, URINE: 1.013 (ref 1.005–1.030)
Squamous Epithelial / LPF: NONE SEEN (ref 0–5)

## 2018-09-03 ENCOUNTER — Encounter: Payer: Self-pay | Admitting: Urology

## 2018-09-03 ENCOUNTER — Ambulatory Visit (INDEPENDENT_AMBULATORY_CARE_PROVIDER_SITE_OTHER): Payer: Medicare Other | Admitting: Urology

## 2018-09-03 VITALS — BP 151/62 | HR 84 | Ht 68.0 in | Wt 147.0 lb

## 2018-09-03 DIAGNOSIS — R3129 Other microscopic hematuria: Secondary | ICD-10-CM | POA: Diagnosis not present

## 2018-09-03 LAB — MICROSCOPIC EXAMINATION
BACTERIA UA: NONE SEEN
Epithelial Cells (non renal): NONE SEEN /hpf (ref 0–10)

## 2018-09-03 LAB — URINE CULTURE: Culture: NO GROWTH

## 2018-09-03 LAB — URINALYSIS, COMPLETE
BILIRUBIN UA: NEGATIVE
Glucose, UA: NEGATIVE
Nitrite, UA: NEGATIVE
PH UA: 6 (ref 5.0–7.5)
PROTEIN UA: NEGATIVE
Specific Gravity, UA: 1.02 (ref 1.005–1.030)
UUROB: 0.2 mg/dL (ref 0.2–1.0)

## 2018-09-03 MED ORDER — CIPROFLOXACIN HCL 500 MG PO TABS
500.0000 mg | ORAL_TABLET | Freq: Once | ORAL | Status: AC
  Administered 2018-09-03: 500 mg via ORAL

## 2018-09-03 MED ORDER — LIDOCAINE HCL URETHRAL/MUCOSAL 2 % EX GEL
1.0000 "application " | Freq: Once | CUTANEOUS | Status: AC
  Administered 2018-09-03: 1 via URETHRAL

## 2018-09-03 NOTE — Progress Notes (Signed)
Cystoscopy Procedure Note:  Indication: Gross hematuria  After informed consent and discussion of the procedure and its risks, [name] [name] was positioned and prepped in the standard fashion. Cystoscopy was performed with a flexible cystoscope. The urethra, bladder neck and entire bladder was visualized in a standard fashion. The prostate was moderate in size. There was papillary appearing bladder tumor at the right lateral wall and trigone.   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

## 2018-09-09 ENCOUNTER — Other Ambulatory Visit: Payer: Self-pay | Admitting: Radiology

## 2018-09-09 DIAGNOSIS — D494 Neoplasm of unspecified behavior of bladder: Secondary | ICD-10-CM

## 2018-09-11 ENCOUNTER — Telehealth: Payer: Self-pay | Admitting: Radiology

## 2018-09-11 ENCOUNTER — Other Ambulatory Visit: Payer: Self-pay | Admitting: Radiology

## 2018-09-11 NOTE — Telephone Encounter (Signed)
Spoke with daughter, Peter Congo, at [phone] regarding appointments related to surgery scheduled 09/25/2018 with Dr Diamantina Providence. Questions answered. Peter Congo voices understanding. Also spoke with son, Jenny Reichmann at [phone]. Questions answered. John voices understanding.

## 2018-09-14 ENCOUNTER — Other Ambulatory Visit: Payer: Self-pay

## 2018-09-14 MED ORDER — CLONAZEPAM 0.5 MG PO TABS: 0.5000 mg | ORAL_TABLET | Freq: Every day | ORAL | 0 refills | Status: DC

## 2018-09-14 NOTE — Telephone Encounter (Signed)
Rx sent to [facility] phone : [phone] , fax : [phone]  

## 2018-09-21 ENCOUNTER — Encounter
Admission: RE | Admit: 2018-09-21 | Discharge: 2018-09-21 | Disposition: A | Discharge status: Home / Self Care | Payer: Medicare Other | Source: Ambulatory Visit | Attending: Urology | Admitting: Urology

## 2018-09-21 ENCOUNTER — Other Ambulatory Visit: Payer: Self-pay

## 2018-09-21 DIAGNOSIS — I4891 Unspecified atrial fibrillation: Secondary | ICD-10-CM | POA: Diagnosis not present

## 2018-09-21 DIAGNOSIS — Z01810 Encounter for preprocedural cardiovascular examination: Secondary | ICD-10-CM | POA: Diagnosis not present

## 2018-09-21 DIAGNOSIS — Z01812 Encounter for preprocedural laboratory examination: Secondary | ICD-10-CM | POA: Insufficient documentation

## 2018-09-21 HISTORY — DX: Personal history of urinary calculi: Z87.442

## 2018-09-21 HISTORY — DX: Hyperlipidemia, unspecified: E78.5

## 2018-09-21 LAB — URINALYSIS, ROUTINE W REFLEX MICROSCOPIC
BILIRUBIN URINE: NEGATIVE
GLUCOSE, UA: NEGATIVE mg/dL
KETONES UR: 5 mg/dL — AB
Nitrite: NEGATIVE
Protein, ur: 30 mg/dL — AB
SPECIFIC GRAVITY, URINE: 1.017 (ref 1.005–1.030)
Squamous Epithelial / LPF: NONE SEEN (ref 0–5)
WBC, UA: >50 WBC/hpf — ABNORMAL HIGH (ref 0–5)
pH: 5 (ref 5.0–8.0)

## 2018-09-21 LAB — BASIC METABOLIC PANEL
Anion gap: 10 (ref 5–15)
BUN: 22 mg/dL (ref 8–23)
CALCIUM: 8.5 mg/dL — AB (ref 8.9–10.3)
CHLORIDE: 105 mmol/L (ref 98–111)
CO2: 23 mmol/L (ref 22–32)
CREATININE: 1.21 mg/dL (ref 0.61–1.24)
GFR calc non Af Amer: 52 mL/min — ABNORMAL LOW (ref >60)
GLUCOSE: 129 mg/dL — AB (ref 70–99)
Potassium: 4 mmol/L (ref 3.5–5.1)
Sodium: 138 mmol/L (ref 135–145)

## 2018-09-21 NOTE — Patient Instructions (Signed)
Your procedure is scheduled on: September 25, 2018 FRIDAY Report to Day Surgery on the 2nd floor of the [facility]. To find out your arrival time, please call [phone] between 1PM - 3PM EP:PIRJJOAC September 24, 2018  REMEMBER: Instructions that are not followed completely may result in serious medical risk, up to and including death; or upon the discretion of your surgeon and anesthesiologist your surgery may need to be rescheduled.  Do not eat food after midnight the night before surgery.  No gum chewing, lozengers or hard candies.  You may however, drink CLEAR liquids up to 2 hours before you are scheduled to arrive for your surgery. Do not drink anything within 2 hours of the start of your surgery.  Clear liquids include: - water   Do NOT drink anything that is not on this list.  Type 1 and Type 2 diabetics should only drink water.  No Alcohol for 24 hours before or after surgery.  No Smoking including e-cigarettes for 24 hours prior to surgery.  No chewable tobacco products for at least 6 hours prior to surgery.  No nicotine patches on the day of surgery.  On the morning of surgery brush your teeth with toothpaste and water, you may rinse your mouth with mouthwash if you wish. Do not swallow any toothpaste or mouthwash.  Notify your doctor if there is any change in your medical condition (cold, fever, infection).  Do not wear jewelry, make-up, hairpins, clips or nail polish.  Do not wear lotions, powders, or perfumes. You may wear deodorant.  Do not shave 48 hours prior to surgery. Men may shave face and neck.  Contacts and dentures may not be worn into surgery.  Do not bring valuables to the hospital, including drivers license, insurance or credit cards.  [facility] is not responsible for any belongings or valuables.   TAKE THESE MEDICATIONS THE MORNING OF SURGERY: CARBIDOPA-LEVIDOPA PREDNISONE SERTRALINE WELLBUTRIN      Follow recommendations from  Cardiologist, Pulmonologist or PCP regarding stopping Aspirin PT SHOULD NOT TAKE ASPIRIN FOR 1 WEEK BEFORE SURGERY  Stop Anti-inflammatories (NSAIDS) such as Advil, Aleve, Ibuprofen, Motrin, Naproxen, Naprosyn and Aspirin based products such as Excedrin, Goodys Powder, BC Powder. (May take Tylenol or Acetaminophen if needed.)  Stop ANY OVER THE COUNTER supplements until after surgery MELATONIN (May continue Vitamin D, Vitamin B, and multivitamin.)  Wear comfortable clothing (specific to your surgery type) to the hospital.  Plan for stool softeners for home use.    If you are being discharged the day of surgery, you will not be allowed to drive home. You will need a responsible adult to drive you home and stay with you that night.   If you are taking public transportation, you will need to have a responsible adult with you. Please confirm with your physician that it is acceptable to use public transportation.   Please call [phone] if you have any questions about these instructions.

## 2018-09-22 ENCOUNTER — Other Ambulatory Visit: Payer: Self-pay | Admitting: Radiology

## 2018-09-22 DIAGNOSIS — D494 Neoplasm of unspecified behavior of bladder: Secondary | ICD-10-CM

## 2018-09-22 MED ORDER — SULFAMETHOXAZOLE-TRIMETHOPRIM 800-160 MG PO TABS: 1.0000 | ORAL_TABLET | Freq: Two times a day (BID) | ORAL | 0 refills | Status: DC

## 2018-09-22 NOTE — Pre-Procedure Instructions (Signed)
UA results sent to Dr. Sninsky for review. 

## 2018-09-24 ENCOUNTER — Non-Acute Institutional Stay (SKILLED_NURSING_FACILITY): Payer: Medicare Other | Admitting: Adult Health

## 2018-09-24 ENCOUNTER — Encounter: Payer: Self-pay | Admitting: Adult Health

## 2018-09-24 DIAGNOSIS — F419 Anxiety disorder, unspecified: Secondary | ICD-10-CM

## 2018-09-24 DIAGNOSIS — D693 Immune thrombocytopenic purpura: Secondary | ICD-10-CM | POA: Diagnosis not present

## 2018-09-24 DIAGNOSIS — I951 Orthostatic hypotension: Secondary | ICD-10-CM

## 2018-09-24 DIAGNOSIS — E538 Deficiency of other specified B group vitamins: Secondary | ICD-10-CM

## 2018-09-24 DIAGNOSIS — Z8546 Personal history of malignant neoplasm of prostate: Secondary | ICD-10-CM

## 2018-09-24 DIAGNOSIS — F339 Major depressive disorder, recurrent, unspecified: Secondary | ICD-10-CM | POA: Diagnosis not present

## 2018-09-24 DIAGNOSIS — G20 Parkinson's disease: Secondary | ICD-10-CM

## 2018-09-24 LAB — URINE CULTURE: Culture: >=100000 — AB

## 2018-09-24 MED ORDER — CIPROFLOXACIN IN D5W 400 MG/200ML IV SOLN: 400.0000 mg | INTRAVENOUS | Status: DC

## 2018-09-24 NOTE — Progress Notes (Signed)
Location:  [facility] Room Number: 397Q Place of Service:  SNF ([phone]) Provider:  Durenda Age, NP  Patient Care Team: Kirk Ruths, MD as PCP - General (Internal Medicine)  Extended Emergency Contact Information Primary Emergency Contact: [place] Phone: [phone] Relation: Daughter Secondary Emergency Contact: [place], [place] Mobile Phone: [phone] Relation: Son Preferred language: English Interpreter needed? No  Code Status: DNR  Goals of care: Advanced Directive information Advanced Directives 09/24/2018  Does Patient Have a Medical Advance Directive? Yes  Type of Advance Directive Out of facility DNR (pink MOST or yellow form)  Does patient want to make changes to medical advance directive? No - Patient declined  Copy of [place] in Chart? -  Pre-existing out of facility DNR order (yellow form or pink MOST form) Yellow form placed in chart (order not valid for inpatient use)     Chief Complaint  Patient presents with  . Medical Management of Chronic Issues    Routine Visit    HPI:  Pt is a 84 y.o. male seen today for medical management of chronic diseases. He has PMH of CVA, spinal stenosis, upper gastrointestinal bleeding, Parkinson's disease and peripheral nerve disease.  He was seen in the room today.  Aspirin 81 mg is on hold for a total of 7 days prior to TURBT which is going to be done tomorrow, 09/25/2018. On 09/03/18 a flexible cystoscopy was done and was noted to have a bladder tumor at the right lateral wall and trigone.He had gross hematuria last month.   Past Medical History:  Diagnosis Date  . Anemia, unspecified    pernicious  . Anxiety    unspecified  . Arthritis   . Atrial fibrillation ([place])   . CHF (congestive heart failure) ([place]) 01/2006  . History of kidney stones   . Hyperlipidemia   . Kidney stones   . Nephrolithiasis   . Parkinson's  disease ([place])   . Peripheral neuropathy   . Prostate cancer ([place]) 11/2006  . Psoriasis   . Psoriasis, unspecified   . Skin cancer   . Sleep disturbance    REM  . Spinal stenosis    s/p lumbar laminectomy  . Unspecified atrial fibrillation ([place])   . Upper GI bleed    unspecified; history of AVMs  . Valvular heart disease   . Vertigo   . Vertigo    Past Surgical History:  Procedure Laterality Date  . CATARACT EXTRACTION    . EYE SURGERY    . INTERAL BLEEDING  10/2011   stomach  . MOHS SURGERY     Facial skin   . PROSTATE SURGERY    . SPINE SURGERY     lumbar laminectomy    Allergies  Allergen Reactions  . Exelon [Rivastigmine Tartrate]     Increased agitation/irritability  . Other     Strawberries  . Penicillins      Review of Systems  GENERAL: No change in appetite, no fatigue, no weight changes, no fever, chills or weakness MOUTH and THROAT: Denies oral discomfort, gingival pain or bleeding, pain from teeth or hoarseness   RESPIRATORY: no cough, SOB, DOE, wheezing, hemoptysis CARDIAC: No chest pain, edema or palpitations GI: No abdominal pain, diarrhea, constipation, heart burn, nausea or vomiting GU: Denies dysuria, frequency, hematuria, incontinence, or discharge PSYCHIATRIC: Denies feelings of depression or anxiety. No report of hallucinations, insomnia, paranoia, or agitation   Immunization History  Administered Date(s) Administered  .  Influenza, High Dose Seasonal PF 09/22/2017  . Influenza-Unspecified 10/09/2012, 09/28/2016  . PPD Test 10/12/2016, 09/29/2017  . Pneumococcal-Unspecified 05/13/2014   Pertinent  Health Maintenance Due  Topic Date Due  . PNA vac Low Risk Adult (2 of 2 - PCV13) 05/14/2015  . INFLUENZA VACCINE  07/30/2018     Vitals:   09/24/18 1117  BP: 101/65  Pulse: 78  Resp: 16  Temp: (!) 97.3 F (36.3 C)  TempSrc: Oral  SpO2: 96%  Weight: 144 lb 11.2 oz (65.6 kg)  Height: 5\' 8"  (1.727 m)   Body mass index is 22  kg/m.  Physical Exam  GENERAL APPEARANCE: Well nourished. In no acute distress. Normal body habitus SKIN:  2 skin tears on right inner arm with dressing MOUTH and THROAT: Lips are without lesions. Oral mucosa is moist and without lesions.  RESPIRATORY: Breathing is even & unlabored, BS CTAB CARDIAC: RRR, no murmur,no extra heart sounds, no edema GI: Abdomen soft, normal BS, no masses, no tenderness EXTREMITIES:  Able to move X 4 extremities NEUROLOGICAL: Mouth and tongue noted to be constantly moving. Speech is clear PSYCHIATRIC: Alert to self, disoriented to time and place. Affect and behavior are appropriate   Labs reviewed: Recent Labs    12/09/17 0500 05/21/18 1310 09/21/18 1400  NA 137 135 138  K 3.9 4.1 4.0  CL 101 101 105  CO2 25 27 23   GLUCOSE 67 107* 129*  BUN 11 13 22   CREATININE 1.00 0.97 1.21  CALCIUM 8.9 8.9 8.5*  MG 2.0  --   --    Recent Labs    12/09/17 0500 05/21/18 1310  AST 18 20  ALT <5* <5*  ALKPHOS 74 79  BILITOT 0.8 0.6  PROT 6.1* 6.8  ALBUMIN 3.7 4.3   Recent Labs    12/09/17 0500 05/21/18 1310 07/27/18 0600 08/04/18 0600  WBC 5.1 6.5 4.7 5.2  NEUTROABS 3.0 5.1  --  2.9  HGB 13.0 12.3* 10.7* 11.3*  HCT 37.9* 36.2* 31.1* 33.1*  MCV 104.6* 105.6* 102.4* 100.6*  PLT 203 217 215 233   Lab Results  Component Value Date   TSH 3.787 12/09/2017    Lab Results  Component Value Date   CHOL 136 11/29/2015   HDL 44 11/29/2015   LDLCALC 80 11/29/2015   TRIG 59 11/29/2015   CHOLHDL 3.1 11/29/2015    Significant Diagnostic Results in last 30 days:  Ct Abdomen Pelvis Wo Contrast  Result Date: 08/26/2018 CLINICAL DATA:  Lower abdominal and pelvic pain with gross hematuria. History of kidney stones. Prostate cancer. EXAM: CT ABDOMEN AND PELVIS WITHOUT CONTRAST TECHNIQUE: Multidetector CT imaging of the abdomen and pelvis was performed following the standard protocol without IV contrast. COMPARISON:  08/18/2014. FINDINGS: Lower chest: Lung  bases show no acute findings. Heart is enlarged. No pericardial or pleural effusion. Distal esophagus is grossly unremarkable. Hepatobiliary: Liver and gallbladder are unremarkable. No biliary ductal dilatation. Pancreas: Negative. Spleen: Negative. Adrenals/Urinary Tract: Adrenal glands are unremarkable. Stones in the kidneys bilaterally. Numerous low-attenuation lesions in the kidneys bilaterally, measuring up to 6.5 x 12.6 cm in the interpolar right kidney, similar to the prior exam. Subcentimeter hyperdense lesion off the upper pole left kidney. Lesions are difficult to fully characterize without post-contrast imaging. No urinary stones. Slight ventral and lateral bladder wall thickening. Stomach/Bowel: Stomach, small bowel and colon are unremarkable. Appendix is not confidently visualized. Vascular/Lymphatic: Atherosclerotic calcification of the arterial vasculature without abdominal aortic aneurysm. No pathologically enlarged lymph nodes. Reproductive:  Prostate is visualized. Other: No free fluid.  Mesenteries and peritoneum are unremarkable. Musculoskeletal: Degenerative changes in the spine. No worrisome lytic or sclerotic lesions. IMPRESSION: 1. Bilateral renal stones. 2. Slight ventral bladder wall thickening. 3. Numerous low-attenuation lesions in the kidneys, difficult to further characterize without post-contrast imaging. 4.  Aortic atherosclerosis (ICD10-170.0). Electronically Signed   By: Lorin Picket M.D.   On: 08/26/2018 10:41    Assessment/Plan  1. Idiopathic thrombocytopenia purpura (HCC) - continue prednisone 5 mg 1 tab daily Lab Results  Component Value Date   PLT 233 08/04/2018   2. Parkinson's disease ([place]) -continue Sinemet 50-200 mg 1 tab twice a day, Sinemet 25-250 mg 2 tabs 3 times a day   3. History of malignant neoplasm of prostate - had gross hematuria a month ago, on 09/03/2018, a flexible cystoscopy was done and was noted to have 2 more at the right lateral wall and  trigone, aspirin 81 mg on hold for a total of 7 days and for TURBT scheduled tomorrow, 09/25/2018, continue alfuzosin ER 24-hour 10 mg 1 tab daily, n.p.o. post midnight  4. Recurrent major depressive disorder, remission status unspecified ([place]) - continue sertraline 50 mg 1 1/2 tab = 75 mg daily, Wellbutrin SR ER 100 mg 1 tab daily   5. Anxiety -mood this is stable, continue clonazepam 1 mg 1 tab nightly and at 4 PM   6. Vitamin B 12 deficiency -continue vitamin B12 1000 mcg 1 tab daily Lab Results  Component Value Date   VITAMINB12 340 12/09/2017    7.  Orthostatic hypotension - BPs are stable, continue midodrine 2.5 mg 1 tab 3 times a day   Family/ staff Communication: Discussed plan of care with the resident.  Labs/tests ordered:  None  Goals of care:  Long term/ [place], NP [facility] and Adult Medicine [phone] (Monday-Friday 8:00 a.m. - 5:00 p.m.) [phone] (after hours)

## 2018-09-25 ENCOUNTER — Ambulatory Visit: Admission: RE | Admit: 2018-09-25 | Payer: Medicare Other | Source: Ambulatory Visit

## 2018-09-25 ENCOUNTER — Encounter: Admission: RE | Payer: Self-pay | Source: Ambulatory Visit

## 2018-09-25 SURGERY — TRANSURETHRAL RESECTION OF BLADDER TUMOR WITH MITOMYCIN-C
Anesthesia: Choice

## 2018-09-25 NOTE — OR Nursing (Signed)
Dr. Diamantina Providence called stating that patients urine came back positive for infection. Request surgery to be cancelled, office to call abx in and reschedule for next Friday. SDS Debbie RN notified.

## 2018-09-25 NOTE — Progress Notes (Signed)
Spoke with tameka at [facility]. Patient surgery cancelled due to infection. MD office will call to r/s and send in antibiotics. Unable to reach family member or patient at this time.

## 2018-09-29 ENCOUNTER — Other Ambulatory Visit: Payer: Self-pay | Admitting: Radiology

## 2018-09-29 ENCOUNTER — Encounter
Admission: RE | Admit: 2018-09-29 | Discharge: 2018-09-29 | Disposition: A | Discharge status: Home / Self Care | Payer: Medicare Other | Source: Ambulatory Visit | Attending: Internal Medicine | Admitting: Internal Medicine

## 2018-09-29 DIAGNOSIS — D494 Neoplasm of unspecified behavior of bladder: Secondary | ICD-10-CM

## 2018-09-29 DIAGNOSIS — R319 Hematuria, unspecified: Secondary | ICD-10-CM | POA: Insufficient documentation

## 2018-09-29 MED ORDER — SULFAMETHOXAZOLE-TRIMETHOPRIM 800-160 MG PO TABS: 1.0000 | ORAL_TABLET | Freq: Two times a day (BID) | ORAL | 0 refills | Status: AC

## 2018-09-29 NOTE — Addendum Note (Signed)
Addended by: Billey Co on: 09/29/2018 08:31 AM   Modules accepted: Orders

## 2018-10-01 ENCOUNTER — Other Ambulatory Visit: Payer: Self-pay | Admitting: Radiology

## 2018-10-01 DIAGNOSIS — D494 Neoplasm of unspecified behavior of bladder: Secondary | ICD-10-CM

## 2018-10-01 MED ORDER — VANCOMYCIN HCL IN DEXTROSE 1-5 GM/200ML-% IV SOLN
1000.0000 mg | INTRAVENOUS | Status: AC
  Administered 2018-10-02: 1000 mg via INTRAVENOUS

## 2018-10-01 MED ORDER — GENTAMICIN SULFATE 40 MG/ML IJ SOLN
5.0000 mg/kg | INTRAVENOUS | Status: DC
  Filled 2018-10-01: qty 8.25

## 2018-10-01 MED ORDER — SODIUM CHLORIDE 0.9 % IR SOLN
2000.0000 mg | Freq: Once | Status: AC
  Administered 2018-10-02: 330 mg

## 2018-10-02 ENCOUNTER — Ambulatory Visit: Payer: Medicare Other | Admitting: Family

## 2018-10-02 ENCOUNTER — Encounter: Payer: Self-pay | Admitting: *Deleted

## 2018-10-02 ENCOUNTER — Other Ambulatory Visit: Payer: Self-pay

## 2018-10-02 ENCOUNTER — Ambulatory Visit
Admission: RE | Admit: 2018-10-02 | Discharge: 2018-10-02 | Disposition: A | Discharge status: Home / Self Care | Payer: Medicare Other | Source: Ambulatory Visit | Attending: Urology | Admitting: Urology

## 2018-10-02 ENCOUNTER — Encounter
Admission: RE | Disposition: A | Discharge status: Home / Self Care | Payer: Self-pay | Source: Ambulatory Visit | Attending: Urology

## 2018-10-02 DIAGNOSIS — Z87891 Personal history of nicotine dependence: Secondary | ICD-10-CM | POA: Diagnosis not present

## 2018-10-02 DIAGNOSIS — Z9849 Cataract extraction status, unspecified eye: Secondary | ICD-10-CM | POA: Insufficient documentation

## 2018-10-02 DIAGNOSIS — D510 Vitamin B12 deficiency anemia due to intrinsic factor deficiency: Secondary | ICD-10-CM | POA: Insufficient documentation

## 2018-10-02 DIAGNOSIS — F419 Anxiety disorder, unspecified: Secondary | ICD-10-CM | POA: Diagnosis not present

## 2018-10-02 DIAGNOSIS — Z880 Allergy status to penicillin: Secondary | ICD-10-CM | POA: Insufficient documentation

## 2018-10-02 DIAGNOSIS — D494 Neoplasm of unspecified behavior of bladder: Secondary | ICD-10-CM

## 2018-10-02 DIAGNOSIS — E785 Hyperlipidemia, unspecified: Secondary | ICD-10-CM | POA: Insufficient documentation

## 2018-10-02 DIAGNOSIS — R42 Dizziness and giddiness: Secondary | ICD-10-CM | POA: Insufficient documentation

## 2018-10-02 DIAGNOSIS — I110 Hypertensive heart disease with heart failure: Secondary | ICD-10-CM | POA: Insufficient documentation

## 2018-10-02 DIAGNOSIS — Z8249 Family history of ischemic heart disease and other diseases of the circulatory system: Secondary | ICD-10-CM | POA: Insufficient documentation

## 2018-10-02 DIAGNOSIS — C679 Malignant neoplasm of bladder, unspecified: Secondary | ICD-10-CM | POA: Insufficient documentation

## 2018-10-02 DIAGNOSIS — G479 Sleep disorder, unspecified: Secondary | ICD-10-CM | POA: Diagnosis not present

## 2018-10-02 DIAGNOSIS — M1990 Unspecified osteoarthritis, unspecified site: Secondary | ICD-10-CM | POA: Diagnosis not present

## 2018-10-02 DIAGNOSIS — I071 Rheumatic tricuspid insufficiency: Secondary | ICD-10-CM | POA: Diagnosis not present

## 2018-10-02 DIAGNOSIS — I509 Heart failure, unspecified: Secondary | ICD-10-CM | POA: Diagnosis not present

## 2018-10-02 DIAGNOSIS — Z888 Allergy status to other drugs, medicaments and biological substances status: Secondary | ICD-10-CM | POA: Diagnosis not present

## 2018-10-02 DIAGNOSIS — Z8546 Personal history of malignant neoplasm of prostate: Secondary | ICD-10-CM | POA: Insufficient documentation

## 2018-10-02 DIAGNOSIS — Z923 Personal history of irradiation: Secondary | ICD-10-CM | POA: Diagnosis not present

## 2018-10-02 DIAGNOSIS — R319 Hematuria, unspecified: Secondary | ICD-10-CM | POA: Insufficient documentation

## 2018-10-02 DIAGNOSIS — G629 Polyneuropathy, unspecified: Secondary | ICD-10-CM | POA: Insufficient documentation

## 2018-10-02 DIAGNOSIS — I4891 Unspecified atrial fibrillation: Secondary | ICD-10-CM | POA: Diagnosis not present

## 2018-10-02 DIAGNOSIS — G20 Parkinson's disease: Secondary | ICD-10-CM | POA: Diagnosis not present

## 2018-10-02 DIAGNOSIS — Z85828 Personal history of other malignant neoplasm of skin: Secondary | ICD-10-CM | POA: Insufficient documentation

## 2018-10-02 DIAGNOSIS — L409 Psoriasis, unspecified: Secondary | ICD-10-CM | POA: Diagnosis not present

## 2018-10-02 DIAGNOSIS — Z87442 Personal history of urinary calculi: Secondary | ICD-10-CM | POA: Diagnosis not present

## 2018-10-02 HISTORY — PX: TRANSURETHRAL RESECTION OF BLADDER TUMOR WITH MITOMYCIN-C: SHX6459

## 2018-10-02 HISTORY — PX: CYSTOSCOPY WITH FULGERATION: SHX6638

## 2018-10-02 SURGERY — TRANSURETHRAL RESECTION OF BLADDER TUMOR WITH MITOMYCIN-C
Anesthesia: General | Site: Bladder

## 2018-10-02 MED ORDER — OXYCODONE HCL 5 MG/5ML PO SOLN: 5.0000 mg | Freq: Once | ORAL | Status: AC | PRN

## 2018-10-02 MED ORDER — SUGAMMADEX SODIUM 200 MG/2ML IV SOLN
INTRAVENOUS | Status: DC | PRN
  Administered 2018-10-02: 100 mg via INTRAVENOUS

## 2018-10-02 MED ORDER — EPHEDRINE SULFATE 50 MG/ML IJ SOLN
INTRAMUSCULAR | Status: DC | PRN
  Administered 2018-10-02 (×3): 10 mg via INTRAVENOUS

## 2018-10-02 MED ORDER — ROCURONIUM BROMIDE 100 MG/10ML IV SOLN
INTRAVENOUS | Status: DC | PRN
  Administered 2018-10-02: 40 mg via INTRAVENOUS

## 2018-10-02 MED ORDER — PHENYLEPHRINE HCL 10 MG/ML IJ SOLN
INTRAMUSCULAR | Status: DC | PRN
  Administered 2018-10-02: 100 ug via INTRAVENOUS

## 2018-10-02 MED ORDER — FENTANYL CITRATE (PF) 100 MCG/2ML IJ SOLN
INTRAMUSCULAR | Status: AC
  Filled 2018-10-02: qty 2

## 2018-10-02 MED ORDER — MIDAZOLAM HCL 2 MG/2ML IJ SOLN
INTRAMUSCULAR | Status: DC | PRN
  Administered 2018-10-02: 1 mg via INTRAVENOUS

## 2018-10-02 MED ORDER — ONDANSETRON HCL 4 MG/2ML IJ SOLN
INTRAMUSCULAR | Status: DC | PRN
  Administered 2018-10-02: 4 mg via INTRAVENOUS

## 2018-10-02 MED ORDER — LACTATED RINGERS IV SOLN
INTRAVENOUS | Status: DC
  Administered 2018-10-02: 07:00:00 via INTRAVENOUS

## 2018-10-02 MED ORDER — MIDAZOLAM HCL 2 MG/2ML IJ SOLN
INTRAMUSCULAR | Status: AC
  Filled 2018-10-02: qty 2

## 2018-10-02 MED ORDER — OXYCODONE HCL 5 MG PO TABS
5.0000 mg | ORAL_TABLET | Freq: Once | ORAL | Status: AC | PRN
  Administered 2018-10-02: 5 mg via ORAL

## 2018-10-02 MED ORDER — FENTANYL CITRATE (PF) 100 MCG/2ML IJ SOLN
INTRAMUSCULAR | Status: DC | PRN
  Administered 2018-10-02: 50 ug via INTRAVENOUS

## 2018-10-02 MED ORDER — FAMOTIDINE 20 MG PO TABS
ORAL_TABLET | ORAL | Status: AC
  Administered 2018-10-02: 20 mg via ORAL
  Filled 2018-10-02: qty 1

## 2018-10-02 MED ORDER — FENTANYL CITRATE (PF) 100 MCG/2ML IJ SOLN
25.0000 ug | INTRAMUSCULAR | Status: DC | PRN
  Administered 2018-10-02 (×3): 25 ug via INTRAVENOUS

## 2018-10-02 MED ORDER — PROPOFOL 10 MG/ML IV BOLUS
INTRAVENOUS | Status: AC
  Filled 2018-10-02: qty 20

## 2018-10-02 MED ORDER — OXYCODONE HCL 5 MG PO TABS
ORAL_TABLET | ORAL | Status: AC
  Filled 2018-10-02: qty 1

## 2018-10-02 MED ORDER — GEMCITABINE CHEMO FOR BLADDER INSTILLATION 2000 MG
INTRAVENOUS | Status: DC | PRN
  Administered 2018-10-02: 2000 mg via INTRAVESICAL

## 2018-10-02 MED ORDER — FAMOTIDINE 20 MG PO TABS
20.0000 mg | ORAL_TABLET | Freq: Once | ORAL | Status: AC
  Administered 2018-10-02: 20 mg via ORAL

## 2018-10-02 MED ORDER — VANCOMYCIN HCL IN DEXTROSE 1-5 GM/200ML-% IV SOLN
INTRAVENOUS | Status: AC
  Administered 2018-10-02: 1000 mg via INTRAVENOUS
  Filled 2018-10-02: qty 200

## 2018-10-02 MED ORDER — PROMETHAZINE HCL 25 MG/ML IJ SOLN: 6.2500 mg | INTRAMUSCULAR | Status: DC | PRN

## 2018-10-02 MED ORDER — MEPERIDINE HCL 50 MG/ML IJ SOLN: 6.2500 mg | INTRAMUSCULAR | Status: DC | PRN

## 2018-10-02 MED ORDER — PROPOFOL 10 MG/ML IV BOLUS
INTRAVENOUS | Status: DC | PRN
  Administered 2018-10-02: 100 mg via INTRAVENOUS

## 2018-10-02 SURGICAL SUPPLY — 36 items
BAG DRAIN CYSTO-URO LG1000N (MISCELLANEOUS) ×4 IMPLANT
BAG URINE DRAINAGE (UROLOGICAL SUPPLIES) ×4 IMPLANT
BRUSH SCRUB EZ  4% CHG (MISCELLANEOUS) ×2
BRUSH SCRUB EZ 4% CHG (MISCELLANEOUS) ×2 IMPLANT
CATH FOLEY 2WAY  5CC 16FR (CATHETERS) ×2
CATH URETL 5X70 OPEN END (CATHETERS) ×4 IMPLANT
CATH URTH 16FR FL 2W BLN LF (CATHETERS) ×2 IMPLANT
CONRAY 43 FOR UROLOGY 50M (MISCELLANEOUS) ×4 IMPLANT
CRADLE LAMINECT ARM (MISCELLANEOUS) ×4 IMPLANT
DRAPE UTILITY 15X26 TOWEL STRL (DRAPES) ×4 IMPLANT
DRSG TELFA 4X3 1S NADH ST (GAUZE/BANDAGES/DRESSINGS) ×4 IMPLANT
ELECT LOOP 22F BIPOLAR SML (ELECTROSURGICAL) ×4
ELECT REM PT RETURN 9FT ADLT (ELECTROSURGICAL)
ELECTRODE LOOP 22F BIPOLAR SML (ELECTROSURGICAL) ×2 IMPLANT
ELECTRODE REM PT RTRN 9FT ADLT (ELECTROSURGICAL) IMPLANT
FORCEPS BIOP PIRANHA Y (CUTTING FORCEPS) ×4 IMPLANT
GLOVE BIO SURGEON STRL SZ7.5 (GLOVE) ×4 IMPLANT
GLOVE INDICATOR 8.0 STRL GRN (GLOVE) ×4 IMPLANT
GOWN STRL REUS W/ TWL LRG LVL3 (GOWN DISPOSABLE) ×2 IMPLANT
GOWN STRL REUS W/ TWL XL LVL3 (GOWN DISPOSABLE) ×2 IMPLANT
GOWN STRL REUS W/TWL LRG LVL3 (GOWN DISPOSABLE) ×2
GOWN STRL REUS W/TWL XL LVL3 (GOWN DISPOSABLE) ×2
KIT TURNOVER CYSTO (KITS) ×4 IMPLANT
LOOP CUT BIPOLAR 24F LRG (ELECTROSURGICAL) IMPLANT
NDL SAFETY ECLIPSE 18X1.5 (NEEDLE) ×2 IMPLANT
NEEDLE HYPO 18GX1.5 SHARP (NEEDLE) ×2
PACK CYSTO AR (MISCELLANEOUS) ×4 IMPLANT
SENSORWIRE 0.038 NOT ANGLED (WIRE) ×4
SET CYSTO W/LG BORE CLAMP LF (SET/KITS/TRAYS/PACK) ×4 IMPLANT
SET IRRIG Y TYPE TUR BLADDER L (SET/KITS/TRAYS/PACK) ×4 IMPLANT
SOL .9 NS 3000ML IRR  AL (IV SOLUTION) ×2
SOL .9 NS 3000ML IRR UROMATIC (IV SOLUTION) ×2 IMPLANT
SURGILUBE 2OZ TUBE FLIPTOP (MISCELLANEOUS) ×4 IMPLANT
SYRINGE IRR TOOMEY STRL 70CC (SYRINGE) ×4 IMPLANT
WATER STERILE IRR 1000ML POUR (IV SOLUTION) ×4 IMPLANT
WIRE SENSOR 0.038 NOT ANGLED (WIRE) ×2 IMPLANT

## 2018-10-02 NOTE — Transfer of Care (Signed)
Immediate Anesthesia Transfer of Care Note  Patient: [name] [name]  Procedure(s) Performed: TRANSURETHRAL RESECTION OF BLADDER TUMOR WITH GEMCITABINE (N/A Bladder) CYSTOSCOPY WITH FULGERATION (N/A Bladder)  Patient Location: PACU  Anesthesia Type:General  Level of Consciousness: drowsy  Airway & Oxygen Therapy: Patient Spontanous Breathing and Patient connected to face mask oxygen  Post-op Assessment: Report given to RN and Post -op Vital signs reviewed and stable  Post vital signs: Reviewed and stable  Last Vitals:  Vitals Value Taken Time  BP 142/69 10/02/2018  8:53 AM  Temp 36.8 C 10/02/2018  8:53 AM  Pulse 106 10/02/2018  8:55 AM  Resp 16 10/02/2018  8:55 AM  SpO2 100 % 10/02/2018  8:55 AM  Vitals shown include unvalidated device data.  Last Pain:  Vitals:   10/02/18 0629  TempSrc: Oral  PainSc: 0-No pain         Complications: No apparent anesthesia complications

## 2018-10-02 NOTE — Anesthesia Preprocedure Evaluation (Signed)
Anesthesia Evaluation  Patient identified by MRN, date of birth, ID band Patient awake    Reviewed: Allergy & Precautions, NPO status , Patient's Chart, lab work & pertinent test results  History of Anesthesia Complications Negative for: history of anesthetic complications  Airway Mallampati: II  TM Distance: >3 FB Neck ROM: Full    Dental  (+) Poor Dentition, Missing   Pulmonary neg sleep apnea, neg COPD, former smoker,    breath sounds clear to auscultation- rhonchi (-) wheezing      Cardiovascular (-) hypertension(-) CAD, (-) Past MI, (-) Cardiac Stents and (-) CABG + dysrhythmias Atrial Fibrillation  Rhythm:Regular Rate:Normal - Systolic murmurs and - Diastolic murmurs Echo 54/65/68: - Left ventricle: The cavity size was moderately dilated. Systolic   function was vigorous. The estimated ejection fraction was in the   range of 65% to 70%. Wall motion was normal; there were no   regional wall motion abnormalities. Doppler parameters are   consistent with abnormal left ventricular relaxation (grade 1   diastolic dysfunction). - Aortic valve: Valve area (Vmax): 2.59 cm^2. - Mitral valve: There was moderate regurgitation. - Left atrium: The atrium was mildly dilated. - Right ventricle: The cavity size was mildly dilated. - Right atrium: The atrium was mildly dilated. - Atrial septum: No defect or patent foramen ovale was identified. - Tricuspid valve: There was severe regurgitation. - Pulmonary arteries: PA peak pressure: 44 mm Hg (S).    Neuro/Psych PSYCHIATRIC DISORDERS Anxiety Depression Schizophrenia Dementia parkinsons disease    GI/Hepatic negative GI ROS, Neg liver ROS,   Endo/Other  negative endocrine ROSneg diabetes  Renal/GU Renal disease: nephrolithiasis.     Musculoskeletal  (+) Arthritis ,   Abdominal (+) - obese,   Peds  Hematology  (+) anemia ,   Anesthesia Other Findings Past Medical  History: No date: Anemia, unspecified     Comment:  pernicious No date: Anxiety     Comment:  unspecified No date: Arthritis No date: Atrial fibrillation ([place]) 01/2006: CHF (congestive heart failure) (HCC) No date: History of kidney stones No date: Hyperlipidemia No date: Kidney stones No date: Nephrolithiasis No date: Parkinson's disease ([place]) No date: Peripheral neuropathy 11/2006: Prostate cancer ([place]) No date: Psoriasis No date: Psoriasis, unspecified No date: Skin cancer No date: Sleep disturbance     Comment:  REM No date: Spinal stenosis     Comment:  s/p lumbar laminectomy No date: Unspecified atrial fibrillation ([place]) No date: Upper GI bleed     Comment:  unspecified; history of AVMs No date: Valvular heart disease No date: Vertigo No date: Vertigo   Reproductive/Obstetrics                             Anesthesia Physical Anesthesia Plan  ASA: III  Anesthesia Plan: General   Post-op Pain Management:    Induction: Intravenous  PONV Risk Score and Plan: 1 and Ondansetron and Midazolam  Airway Management Planned: Oral ETT  Additional Equipment:   Intra-op Plan:   Post-operative Plan: Extubation in OR  Informed Consent: I have reviewed the patients History and Physical, chart, labs and discussed the procedure including the risks, benefits and alternatives for the proposed anesthesia with the patient or authorized representative who has indicated his/her understanding and acceptance.   Dental advisory given  Plan Discussed with: CRNA and Anesthesiologist  Anesthesia Plan Comments:         Anesthesia Quick Evaluation

## 2018-10-02 NOTE — Anesthesia Procedure Notes (Signed)
Procedure Name: Intubation Date/Time: 10/02/2018 7:52 AM Performed by: Gentry Fitz, CRNA Pre-anesthesia Checklist: Patient identified, Emergency Drugs available, Suction available and Patient being monitored Patient Re-evaluated:Patient Re-evaluated prior to induction Oxygen Delivery Method: Circle system utilized Preoxygenation: Pre-oxygenation with 100% oxygen Induction Type: IV induction Ventilation: Mask ventilation without difficulty Laryngoscope Size: Mac and 4 Grade View: Grade II Tube type: Oral Tube size: 7.5 mm Number of attempts: 1 Placement Confirmation: ETT inserted through vocal cords under direct vision,  positive ETCO2 and breath sounds checked- equal and bilateral Secured at: 22 cm Tube secured with: Tape Dental Injury: Teeth and Oropharynx as per pre-operative assessment

## 2018-10-02 NOTE — Op Note (Signed)
Date of procedure: 10/02/18  Preoperative diagnosis:  1. Bladder tumor  Postoperative diagnosis:  1. Same  Procedure: 1. Cystoscopy, bladder biopsy and fulguration  Surgeon: [name], MD  Anesthesia: General  Complications: None  Intraoperative findings:  Fixed prostate consistent with prior radiation.  Ureteral orifices difficult to identify, just proximal to the bladder neck.  Small less than 1 cm papillary tumor at the anterior bladder wall.  EBL: Minimal  Specimens: Bladder tumor  Drains: 20 French two-way coud Foley  Indication: [name] [name] is a 84 y.o. patient with multiple comorbidities, including history of prostate cancer treated with radiation.  He presented with hematuria and was found to have a very small tumor at the anterior bladder wall and elected to go undergo biopsy and fulguration.  After reviewing the management options for treatment, they elected to proceed with the above surgical procedure(s). We have discussed the potential benefits and risks of the procedure, side effects of the proposed treatment, the likelihood of the patient achieving the goals of the procedure, and any potential problems that might occur during the procedure or recuperation. Informed consent has been obtained.  Description of procedure:  The patient was taken to the operating room and general anesthesia was induced.  The patient was placed in the dorsal lithotomy position, prepped and draped in the usual sterile fashion, and preoperative antibiotics(vancomycin/gentamicin) were administered. A preoperative time-out was performed.   5 French cystoscope with a 30 degree lens was used to intubate the urethra.  Normal-appearing urethra was followed proximally into the bladder.  The prostate was fixed consistent with prior radiation.  On cystoscopy, the ureteral orifice ease were difficult to identify and were just proximal to the bladder neck, difficult to reach secondary to the  radiated prostate.  Thorough inspection of the bladder was performed with a 30 degree and 70 degree lens.  There was an approximately 0.5 cm area of papillary change at the anterior bladder wall.  This was very challenging to identify, and we could only visualize this with a 70 degree lens.  At this point, I elected not to perform bilateral retrograde pyelograms secondary to the very difficult location of the ureters, and very small and low-grade appearing tumor burden.  I felt the risks of attempting to cannulate the ureters with a wire and 5 [place] significantly outweighed any benefit.  Using the visual obturator I passed a 24 [place] resectoscope, however despite anterior pressure on the bladder, and compressed and filled bladder, was still unable to visualize the previously seen lesion.  At this point, I elected to use the flexible cystoscope to better visualize this lesion.  On retroflexion I was able to confirm this small papillary lesion at the very anterior bladder.  The [place] biopsy forceps were passed through the flexible cystoscope and used to take a tissue sample.  This was sent for permanent pathology.  I then passed a flexible Bugbee through the scope and thoroughly fulgurated this area.  There was no residual tumor at the conclusion of the procedure.  There was excellent hemostasis.  As we were removing the scope, I did note some mild trauma at the previously radiated prostate.  A 20 [place] coud two-way catheter was passed into the bladder with slight catch at the prostate.   The patient was awakened and taken to PACU in stable condition   2000mg /17mL of gemcitabine was instilled into the bladder and the catheter was clamped.  Disposition: Stable to PACU  Plan: Unclamp Foley and drain gemcitabine  at 9:40 AM Remove foley at facility Monday morning, 10/7 Will call son Jenny Reichmann with pathology next week, at [place], MD 10/02/2018

## 2018-10-02 NOTE — Anesthesia Postprocedure Evaluation (Signed)
Anesthesia Post Note  Patient: Andrus Sharp  Procedure(s) Performed: TRANSURETHRAL RESECTION OF BLADDER TUMOR WITH GEMCITABINE (N/A Bladder) CYSTOSCOPY WITH FULGERATION (N/A Bladder)  Patient location during evaluation: PACU Anesthesia Type: General Level of consciousness: awake and alert and oriented Pain management: pain level controlled Vital Signs Assessment: post-procedure vital signs reviewed and stable Respiratory status: spontaneous breathing, nonlabored ventilation and respiratory function stable Cardiovascular status: blood pressure returned to baseline and stable Postop Assessment: no signs of nausea or vomiting Anesthetic complications: no     Last Vitals:  Vitals:   10/02/18 0938 10/02/18 0958  BP:  127/63  Pulse:  82  Resp:  16  Temp: 36.8 C 36.6 C  SpO2:  97%    Last Pain:  Vitals:   10/02/18 0938  TempSrc:   PainSc: 4                  Taquana Bartley

## 2018-10-02 NOTE — Anesthesia Post-op Follow-up Note (Signed)
Anesthesia QCDR form completed.        

## 2018-10-02 NOTE — Discharge Instructions (Signed)

## 2018-10-02 NOTE — H&P (Signed)
   7:27 AM   [name] [name] [birth year] [id number]  HPI: Here for bilateral RPG, TURBT, and post-op gemcitabine. 84 yo M with hematuria and multiple papillary bladder tumors seen on cystoscopy.  Surgery canceled last week for Staph UTI, has been on culture appropriate bactrim.  Denies fever, chest pain, SOB.   PMH: Past Medical History:  Diagnosis Date  . Anemia, unspecified    pernicious  . Anxiety    unspecified  . Arthritis   . Atrial fibrillation ([place])   . CHF (congestive heart failure) ([place]) 01/2006  . History of kidney stones   . Hyperlipidemia   . Kidney stones   . Nephrolithiasis   . Parkinson's disease ([place])   . Peripheral neuropathy   . Prostate cancer ([place]) 11/2006  . Psoriasis   . Psoriasis, unspecified   . Skin cancer   . Sleep disturbance    REM  . Spinal stenosis    s/p lumbar laminectomy  . Unspecified atrial fibrillation ([place])   . Upper GI bleed    unspecified; history of AVMs  . Valvular heart disease   . Vertigo   . Vertigo     Surgical History: Past Surgical History:  Procedure Laterality Date  . CATARACT EXTRACTION    . EYE SURGERY    . INTERAL BLEEDING  10/2011   stomach  . MOHS SURGERY     Facial skin   . PROSTATE SURGERY    . SPINE SURGERY     lumbar laminectomy    Allergies:  Allergies  Allergen Reactions  . Exelon [Rivastigmine Tartrate]     Increased agitation/irritability  . Other     Strawberries  . Penicillins     Family History: Family History  Problem Relation Age of Onset  . Hypertension Mother   . Heart disease Father   . Heart disease Brother     Social History:  reports that he quit smoking about 32 years ago. His smoking use included cigarettes. He has never used smokeless tobacco. He reports that he does not drink alcohol or use drugs.  ROS: Negative  Physical Exam: BP (!) 143/72   Pulse 71   Temp (!) 97.1 F (36.2 C) (Oral)   Resp 16   SpO2 100%    Constitutional:  Alert and oriented, No  acute distress. Cardiovascular: RRR Respiratory: CTA b/l GI: Abdomen is soft, nontender, nondistended, no abdominal masses GU: No CVA tenderness Lymph: No cervical or inguinal lymphadenopathy. Skin: No rashes, bruises or suspicious lesions. Neurologic: Grossly intact, no focal deficits, moving all 4 extremities. Psychiatric: Normal mood and affect.  Laboratory Data: Urine culture 9/23 with Staph aureus, has been on culture appropriate bactrim  Assessment & Plan:   84 yo co-morbid male here for bilateral retrograde pyelograms, TURBT, gemcitabine instillation. Informed consent obtained, discussed risks of bleeding, infection, need for additional procedures, bladder perforation, possible foley placement, and treatment course pending pathology results including possible repeat TURBT, and need for long term surveillance   Billey Co, [place] [address] [place], [place] [address] [phone]

## 2018-10-02 NOTE — OR Nursing (Signed)
Discharge instructions discussed with son and called to [facility]. Copy of discharge instructions given to son and also gave him a copy to give to [facility]. Pt dressed and assisted to wheel chair. Nursing assistant escorted pt to transportation truck.

## 2018-10-03 ENCOUNTER — Encounter: Payer: Self-pay | Admitting: Urology

## 2018-10-05 LAB — SURGICAL PATHOLOGY

## 2018-10-05 MED FILL — Gemcitabine HCl For Inj 1 GM: INTRAVENOUS | Qty: 2000 | Status: AC

## 2018-10-09 ENCOUNTER — Encounter: Payer: Self-pay | Admitting: Adult Health

## 2018-10-09 ENCOUNTER — Other Ambulatory Visit
Admission: RE | Admit: 2018-10-09 | Discharge: 2018-10-09 | Disposition: A | Discharge status: Home / Self Care | Payer: Medicare Other | Source: Ambulatory Visit | Attending: Internal Medicine | Admitting: Internal Medicine

## 2018-10-09 ENCOUNTER — Telehealth: Payer: Self-pay | Admitting: Urology

## 2018-10-09 NOTE — Progress Notes (Signed)
Entered in error

## 2018-10-09 NOTE — Telephone Encounter (Signed)
-----   Message from Billey Co, MD sent at 10/08/2018 12:52 PM EDT ----- I discussed his pathology results with his son, please set up clinic cystoscopy in 3 months.

## 2018-10-09 NOTE — Telephone Encounter (Signed)
App made lm to cb to confirm and mailed to pt   MB

## 2018-10-12 ENCOUNTER — Encounter: Payer: Self-pay | Admitting: Adult Health

## 2018-10-12 ENCOUNTER — Non-Acute Institutional Stay (SKILLED_NURSING_FACILITY): Payer: Medicare Other | Admitting: Adult Health

## 2018-10-12 DIAGNOSIS — F0281 Dementia in other diseases classified elsewhere with behavioral disturbance: Secondary | ICD-10-CM | POA: Diagnosis not present

## 2018-10-12 DIAGNOSIS — R634 Abnormal weight loss: Secondary | ICD-10-CM | POA: Diagnosis not present

## 2018-10-12 DIAGNOSIS — F02818 Dementia in other diseases classified elsewhere, unspecified severity, with other behavioral disturbance: Secondary | ICD-10-CM

## 2018-10-12 DIAGNOSIS — G301 Alzheimer's disease with late onset: Secondary | ICD-10-CM | POA: Diagnosis not present

## 2018-10-12 DIAGNOSIS — F339 Major depressive disorder, recurrent, unspecified: Secondary | ICD-10-CM

## 2018-10-12 NOTE — Progress Notes (Signed)
[facility]:   [facility] Room Number: [place] of Service:  SNF ([phone])   CODE STATUS: DNR  Allergies  Allergen Reactions  . Exelon [Rivastigmine Tartrate]     Increased agitation/irritability  . Other     Strawberries  . Penicillins     Chief Complaint  Patient presents with  . Acute Visit    Weight Loss    HPI:  He has been losing weight. His weight in Feb 2019: 150 pounds; 08-26-18: 145 pounds; 10-12-18: 139 pounds. He states that his appetite is poor. He denies abdominal pain; no constipation or diarrhea. He is presently taking wellbutrin and zoloft for his depression.   Past Medical History:  Diagnosis Date  . Anemia, unspecified    pernicious  . Anxiety    unspecified  . Arthritis   . Atrial fibrillation ([place])   . CHF (congestive heart failure) ([place]) 01/2006  . History of kidney stones   . Hyperlipidemia   . Kidney stones   . Nephrolithiasis   . Parkinson's disease ([place])   . Peripheral neuropathy   . Prostate cancer ([place]) 11/2006  . Psoriasis   . Psoriasis, unspecified   . Skin cancer   . Sleep disturbance    REM  . Spinal stenosis    s/p lumbar laminectomy  . Unspecified atrial fibrillation ([place])   . Upper GI bleed    unspecified; history of AVMs  . Valvular heart disease   . Vertigo   . Vertigo     Past Surgical History:  Procedure Laterality Date  . CATARACT EXTRACTION    . CYSTOSCOPY WITH FULGERATION N/A 10/02/2018   Procedure: CYSTOSCOPY WITH FULGERATION;  Surgeon: Billey Co, MD;  Location: [facility] ORS;  Service: Urology;  Laterality: N/A;  . EYE SURGERY    . INTERAL BLEEDING  10/2011   stomach  . MOHS SURGERY     Facial skin   . PROSTATE SURGERY    . SPINE SURGERY     lumbar laminectomy  . TRANSURETHRAL RESECTION OF BLADDER TUMOR WITH MITOMYCIN-C N/A 10/02/2018   Procedure: TRANSURETHRAL RESECTION OF BLADDER TUMOR WITH GEMCITABINE;  Surgeon: Billey Co, MD;  Location: [facility] ORS;  Service: Urology;   Laterality: N/A;    Social History   Socioeconomic History  . Marital status: Widowed    Spouse name: Not on file  . Number of children: 2  . Years of education: College  . 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 Financial resource strain: Not on file  . Food insecurity:    Worry: Not on file    Inability: Not on file  . Transportation needs:    Medical: Not on file    Non-medical: Not on file  Tobacco Use  . Smoking status: Former Smoker    Types: Cigarettes    Last attempt to quit: 03/30/1986    Years since quitting: 32.5  . Smokeless tobacco: Never Used  Substance and Sexual Activity  . Alcohol use: No  . Drug use: No  . Sexual activity: Not on file  Lifestyle  . Physical activity:    Days per week: Not on file    Minutes per session: Not on file  . Stress: Not on file  Relationships  . Social connections:    Talks on phone: Not on file    Gets together: Not on file    Attends religious  service: Not on file    Active member of club or organization: Not on file    Attends meetings of clubs or organizations: Not on file    Relationship status: Not on file  . Intimate partner violence:    Fear of current or ex partner: Not on file    Emotionally abused: Not on file    Physically abused: Not on file    Forced sexual activity: Not on file  Other Topics Concern  . Not on file  Social History Narrative   Admit Date: 01/26/2016 to [facility]   Widowed   2 children   Denies alcohol use   Former smoker   DNR   Family History  Problem Relation Age of Onset  . Hypertension Mother   . Heart disease Father   . Heart disease Brother       VITAL SIGNS BP 103/73   Pulse 84   Temp 98.5 F (36.9 C)   Resp 20   Ht 5\' 8"  (1.727 m)   Wt 139 lb 12.8 oz (63.4 kg)   SpO2 97%   BMI 21.26 kg/m   Outpatient Encounter Medications as of 10/12/2018  Medication Sig  . acetaminophen (TYLENOL)  325 MG tablet Take 650 mg by mouth every 4 (four) hours as needed. for pain/ increased temp. May be administered orally, per G-tube if needed or rectally if unable to swallow (separate order). Maximum of 3000 mg Acetaminophen in 24 hours.  Marland Kitchen acetaminophen (TYLENOL) 500 MG tablet Take 500 mg by mouth 2 (two) times daily. Maximum of 3000 mg of acetaminophen in 24 hours. Check prn acetaminophen order prior to administration due to limit.  Marland Kitchen alfuzosin (UROXATRAL) 10 MG 24 hr tablet Take 10 mg by mouth every evening.   . bisacodyl (DULCOLAX) 10 MG suppository Place 10 mg rectally daily as needed for moderate constipation.  Marland Kitchen buPROPion (WELLBUTRIN SR) 100 MG 12 hr tablet Take 100 mg by mouth daily.  . carbidopa-levodopa (SINEMET CR) 50-200 MG tablet Take 1 tablet by mouth 2 (two) times daily.   . carbidopa-levodopa (SINEMET IR) 25-250 MG tablet Take 2 tablets by mouth 3 (three) times daily.  . carboxymethylcellulose (REFRESH TEARS) 0.5 % SOLN Place 2 drops into both eyes 2 (two) times daily as needed (dry eyes).  . clonazePAM (KLONOPIN) 0.5 MG tablet Take 0.5 mg by mouth daily. For afternoon agitation, restlessness  . clonazePAM (KLONOPIN) 1 MG tablet Take 1 mg by mouth at bedtime.   . coal tar (NEUTROGENA T-GEL) 0.5 % shampoo Apply 1 application topically 2 (two) times a week. Enough to lather   . cyanocobalamin 1000 MCG tablet Take 1,000 mcg by mouth daily.  . hydrocortisone cream 1 % Apply thin film topically  to clean, dry hemorrhoids 4 times a day as needed  . magnesium hydroxide (MILK OF MAGNESIA) 400 MG/5ML suspension Take 30 mLs by mouth every 4 (four) hours as needed for mild constipation.  . Melatonin 3 MG TABS Take 1 tablet by mouth at bedtime.  . midodrine (PROAMATINE) 2.5 MG tablet Take 2.5 mg by mouth 3 (three) times daily with meals. Take along with sinemet  . morphine (ROXANOL) 20 MG/ML concentrated solution Take 5-10 mg by mouth every hour as needed for severe pain. 0.25 mL for mild to  moderate pain and/or dyspnea. 0.50 mL for moderate to severe pain and/or dyspnea.  . NON FORMULARY Diet type:  Regular  . ondansetron (ZOFRAN) 4 MG tablet Take 4 mg by mouth  every 4 (four) hours as needed for nausea or vomiting.  . Phenol 0.5 % LIQD Use as directed 2 sprays in the mouth or throat every 2 (two) hours as needed (sore throat).   . polyethylene glycol (MIRALAX / GLYCOLAX) packet Take 17 g by mouth 2 (two) times daily. Mix with 4-6 ounces of fluid  . predniSONE (DELTASONE) 5 MG tablet Take 5 mg by mouth daily.   . Probiotic Product (RISA-BID PROBIOTIC) TABS Take 1 tablet by mouth 2 (two) times daily.  . sennosides-docusate sodium (SENOKOT-S) 8.6-50 MG tablet Take 2 tablets by mouth 3 (three) times daily.   . sertraline (ZOLOFT) 50 MG tablet Take 75 mg by mouth daily. 1 and 1/2 tablet For anxiety and depression (MDD, recurrent)  . Skin Protectants, Misc. (ENDIT EX) Apply liberal amount topically to areas of skin irritation 2 times daily and prn. Ok to leave at bedside.  . sorbitol 70 % solution Take 30 mLs by mouth every 2 (two) hours as needed (constipation).   . triamcinolone cream (KENALOG) 0.1 % Apply 1 application topically 2 (two) times daily as needed (psoriatic rash).   . trolamine salicylate (ASPERCREME) 10 % cream Apply 1 application topically 3 (three) times daily as needed (neck pain). Apply thin film to the neck   No facility-administered encounter medications on file as of 10/12/2018.      SIGNIFICANT DIAGNOSTIC EXAMS  LABS REVIEWED PREVIOUS:   05-21-18: wbc 6.5; hgb 12.3; hct 36.2; mcv 105.6; plt 217; glucose 107; bun 13; creat 0.97; k+ 4.1 na++ 135 ca 8.9; liver normal albumin 4.3 PSA 0.03 07-06-18: urine culture: no growth  NO NEW LABS.     Review of Systems  Constitutional: Positive for malaise/fatigue.       Poor appetite   Respiratory: Negative for cough and shortness of breath.   Cardiovascular: Negative for chest pain, palpitations and leg swelling.    Gastrointestinal: Negative for abdominal pain, constipation and heartburn.  Musculoskeletal: Negative for back pain, joint pain and myalgias.  Skin: Negative.   Neurological: Negative for dizziness.  Psychiatric/Behavioral: The patient is not nervous/anxious.    Physical Exam  Constitutional: No distress.  Thin   Neck: No thyromegaly present.  Cardiovascular: Normal rate, regular rhythm, normal heart sounds and intact distal pulses.  Pulmonary/Chest: Effort normal and breath sounds normal. No respiratory distress.  Abdominal: Soft. Bowel sounds are normal. He exhibits no distension. There is no tenderness.  Musculoskeletal: He exhibits no edema.  Bilateral hand tremor Uses wheelchair     Lymphadenopathy:    He has no cervical adenopathy.  Neurological: He is alert.  Skin: Skin is warm and dry. He is not diaphoretic.  Psychiatric: He has a normal mood and affect.    ASSESSMENT/ PLAN:  TODAY:   1.   Late onset alzheimer's disease with behavioral disturbance: is losing weight; weight is 139; pounds; will continue to monitor his status.   2. Major depression disorder, recurrent: is stable will continue  melatonin 3 mg nightly is taking klonopin 0.5 mg twice daily for anxiety.  Will lower wellbutrin to 75 mg daily and will increase zoloft to 100 mg daily  3. Unintentional weight loss of more than 10 pounds in 90 days: his current weight is 139 pounds: will begin remeron 7.5 mg nightly for 30 days and will monitor his response.      MD is aware of resident's narcotic use and is in agreement with current plan of care. We will attempt to wean resident  as apropriate   Ok Edwards NP [facility]  Contact [phone] Monday through Friday 8am- 5pm  After hours call [phone]

## 2018-10-13 ENCOUNTER — Other Ambulatory Visit
Admission: RE | Admit: 2018-10-13 | Discharge: 2018-10-13 | Disposition: A | Discharge status: Home / Self Care | Payer: Medicare Other | Source: Ambulatory Visit | Attending: Adult Health | Admitting: Adult Health

## 2018-10-13 DIAGNOSIS — I5022 Chronic systolic (congestive) heart failure: Secondary | ICD-10-CM | POA: Insufficient documentation

## 2018-10-14 ENCOUNTER — Other Ambulatory Visit
Admission: RE | Admit: 2018-10-14 | Discharge: 2018-10-14 | Disposition: A | Discharge status: Home / Self Care | Payer: Medicare Other | Source: Ambulatory Visit | Attending: Internal Medicine | Admitting: Internal Medicine

## 2018-10-14 DIAGNOSIS — I5022 Chronic systolic (congestive) heart failure: Secondary | ICD-10-CM | POA: Insufficient documentation

## 2018-10-14 LAB — COMPREHENSIVE METABOLIC PANEL
ALK PHOS: 88 U/L (ref 38–126)
ALT: 7 U/L (ref 0–44)
ANION GAP: 8 (ref 5–15)
AST: 19 U/L (ref 15–41)
Albumin: 3.9 g/dL (ref 3.5–5.0)
BILIRUBIN TOTAL: 0.4 mg/dL (ref 0.3–1.2)
BUN: 22 mg/dL (ref 8–23)
CALCIUM: 8.9 mg/dL (ref 8.9–10.3)
CO2: 22 mmol/L (ref 22–32)
Chloride: 110 mmol/L (ref 98–111)
Creatinine, Ser: 0.94 mg/dL (ref 0.61–1.24)
Glucose, Bld: 95 mg/dL (ref 70–99)
Potassium: 4.1 mmol/L (ref 3.5–5.1)
Sodium: 140 mmol/L (ref 135–145)
TOTAL PROTEIN: 6.4 g/dL — AB (ref 6.5–8.1)

## 2018-10-21 ENCOUNTER — Encounter: Payer: Self-pay | Admitting: Adult Health

## 2018-10-21 DIAGNOSIS — R634 Abnormal weight loss: Secondary | ICD-10-CM | POA: Insufficient documentation

## 2018-10-30 ENCOUNTER — Encounter
Admission: RE | Admit: 2018-10-30 | Discharge: 2018-10-30 | Disposition: A | Discharge status: Home / Self Care | Payer: Medicare Other | Source: Ambulatory Visit | Attending: Internal Medicine | Admitting: Internal Medicine

## 2018-10-30 DIAGNOSIS — R319 Hematuria, unspecified: Secondary | ICD-10-CM | POA: Insufficient documentation

## 2018-11-02 ENCOUNTER — Encounter: Payer: Self-pay | Admitting: Adult Health

## 2018-11-02 ENCOUNTER — Non-Acute Institutional Stay (SKILLED_NURSING_FACILITY): Payer: Medicare Other | Admitting: Adult Health

## 2018-11-02 DIAGNOSIS — M48061 Spinal stenosis, lumbar region without neurogenic claudication: Secondary | ICD-10-CM

## 2018-11-02 DIAGNOSIS — D693 Immune thrombocytopenic purpura: Secondary | ICD-10-CM

## 2018-11-02 DIAGNOSIS — I639 Cerebral infarction, unspecified: Secondary | ICD-10-CM | POA: Diagnosis not present

## 2018-11-02 DIAGNOSIS — G20 Parkinson's disease: Secondary | ICD-10-CM

## 2018-11-02 DIAGNOSIS — G20A1 Parkinson's disease without dyskinesia, without mention of fluctuations: Secondary | ICD-10-CM

## 2018-11-02 NOTE — Progress Notes (Signed)
[facility] Room Number: [place] of Service:  SNF (31)   CODE STATUS: DNR  Allergies  Allergen Reactions  . Exelon [Rivastigmine Tartrate]     Increased agitation/irritability  . Other     Strawberries  . Penicillins     Chief Complaint  Patient presents with  . Medical Management of Chronic Issues    Cerebral infarction, unspecified mechanism; parkinson's disease; idiopathic thrombocytopenia purpura; spinal stenosis lumbar region without neurogenic claudication.     HPI:  He is a 84 year old long term resident of this facility being for the management of her chronic illnesses: cva; parkinson disease; purpura; spinal stenosis. He denies any uncontrolled pain; no blood in urine at this time; no changes in appetite; no depressive thoughts. He had a bladder biopsy done on 10-02-18.    Past Medical History:  Diagnosis Date  . Alzheimer's disease ([place]) 11/29/2015  . Anemia, unspecified    pernicious  . Anxiety    unspecified  . Arthritis   . Atrial fibrillation ([place])   . CHF (congestive heart failure) ([place]) 01/2006  . History of kidney stones   . Hyperlipidemia   . Kidney stones   . Nephrolithiasis   . Parkinson's disease ([place])   . Peripheral neuropathy   . Prostate cancer ([place]) 11/2006  . Psoriasis   . Psoriasis, unspecified   . Skin cancer   . Sleep disturbance    REM  . Spinal stenosis    s/p lumbar laminectomy  . Unspecified atrial fibrillation ([place])   . Upper GI bleed    unspecified; history of AVMs  . Valvular heart disease   . Vertigo   . Vertigo     Past Surgical History:  Procedure Laterality Date  . CATARACT EXTRACTION    . CYSTOSCOPY WITH FULGERATION N/A 10/02/2018   Procedure: CYSTOSCOPY WITH FULGERATION;  Surgeon: Billey Co, MD;  Location: [facility] ORS;  Service: Urology;  Laterality: N/A;  . EYE SURGERY    . INTERAL BLEEDING  10/2011   stomach  . MOHS SURGERY     Facial skin   . PROSTATE SURGERY     . SPINE SURGERY     lumbar laminectomy  . TRANSURETHRAL RESECTION OF BLADDER TUMOR WITH MITOMYCIN-C N/A 10/02/2018   Procedure: TRANSURETHRAL RESECTION OF BLADDER TUMOR WITH GEMCITABINE;  Surgeon: Billey Co, MD;  Location: [facility] ORS;  Service: Urology;  Laterality: N/A;    Social History   Socioeconomic History  . Marital status: Widowed    Spouse name: Not on file  . Number of children: 2  . Years of education: College  . 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 Financial resource strain: Not on file  . Food insecurity:    Worry: Not on file    Inability: Not on file  . Transportation needs:    Medical: Not on file    Non-medical: Not on file  Tobacco Use  . Smoking status: Former Smoker    Types: Cigarettes    Last attempt to quit: 03/30/1986    Years since quitting: 32.6  . Smokeless tobacco: Never Used  Substance and Sexual Activity  . Alcohol use: No  . Drug use: No  . Sexual activity: Not on file  Lifestyle  . Physical activity:    Days per week: Not on file    Minutes per session: Not on  file  . Stress: Not on file  Relationships  . Social connections:    Talks on phone: Not on file    Gets together: Not on file    Attends religious service: Not on file    Active member of club or organization: Not on file    Attends meetings of clubs or organizations: Not on file    Relationship status: Not on file  . Intimate partner violence:    Fear of current or ex partner: Not on file    Emotionally abused: Not on file    Physically abused: Not on file    Forced sexual activity: Not on file  Other Topics Concern  . Not on file  Social History Narrative   Admit Date: 01/26/2016 to [place]   Widowed   2 children   Denies alcohol use   Former smoker   DNR   Family History  Problem Relation Age of Onset  . Hypertension Mother   . Heart disease Father   . Heart disease Brother        VITAL SIGNS BP 103/73   Pulse 84   Temp 98.5 F (36.9 C)   Resp 20   Ht 5\' 8"  (1.727 m)   Wt 142 lb 8 oz (64.6 kg)   SpO2 97%   BMI 21.67 kg/m   Outpatient Encounter Medications as of 11/02/2018  Medication Sig  . acetaminophen (TYLENOL) 325 MG tablet Take 650 mg by mouth every 4 (four) hours as needed. for pain/ increased temp. May be administered orally, per G-tube if needed or rectally if unable to swallow (separate order). Maximum of 3000 mg Acetaminophen in 24 hours.  Marland Kitchen acetaminophen (TYLENOL) 500 MG tablet Take 500 mg by mouth 2 (two) times daily. Maximum of 3000 mg of acetaminophen in 24 hours. Check prn acetaminophen order prior to administration due to limit.  Marland Kitchen alfuzosin (UROXATRAL) 10 MG 24 hr tablet Take 10 mg by mouth every evening.   . bisacodyl (DULCOLAX) 10 MG suppository Place 10 mg rectally daily as needed for moderate constipation.  . BUPROPION HCL ER, SR, PO Take 75 mg by mouth daily.   . carbidopa-levodopa (SINEMET CR) 50-200 MG tablet Take 1 tablet by mouth 2 (two) times daily.   . carbidopa-levodopa (SINEMET IR) 25-250 MG tablet Take 2 tablets by mouth 3 (three) times daily.  . carboxymethylcellulose (REFRESH TEARS) 0.5 % SOLN Place 2 drops into both eyes 2 (two) times daily as needed (dry eyes).  . clonazePAM (KLONOPIN) 0.5 MG tablet Take 0.5 mg by mouth daily. For afternoon agitation, restlessness  . clonazePAM (KLONOPIN) 1 MG tablet Take 1 mg by mouth at bedtime.   . coal tar (NEUTROGENA T-GEL) 0.5 % shampoo Apply 1 application topically 2 (two) times a week. Enough to lather   . cyanocobalamin 1000 MCG tablet Take 1,000 mcg by mouth daily.  . hydrocortisone cream 1 % Apply thin film topically  to clean, dry hemorrhoids 4 times a day as needed  . magnesium hydroxide (MILK OF MAGNESIA) 400 MG/5ML suspension Take 30 mLs by mouth every 4 (four) hours as needed for mild constipation.  . Melatonin 3 MG TABS Take 1 tablet by mouth at bedtime.  . midodrine  (PROAMATINE) 2.5 MG tablet Take 2.5 mg by mouth 3 (three) times daily with meals. Take along with sinemet  . mirtazapine (REMERON) 15 MG tablet Take 7.5 mg by mouth at bedtime.  Marland Kitchen morphine (ROXANOL) 20 MG/ML concentrated solution Take 5-10 mg  by mouth every hour as needed for severe pain. 0.25 mL for mild to moderate pain and/or dyspnea. 0.50 mL for moderate to severe pain and/or dyspnea.  . NON FORMULARY Diet type:  Regular  . ondansetron (ZOFRAN) 4 MG tablet Take 4 mg by mouth every 4 (four) hours as needed for nausea or vomiting.  . Phenol 0.5 % LIQD Use as directed 2 sprays in the mouth or throat every 2 (two) hours as needed (sore throat).   . polyethylene glycol (MIRALAX / GLYCOLAX) packet Take 17 g by mouth 2 (two) times daily. Mix with 4-6 ounces of fluid  . predniSONE (DELTASONE) 5 MG tablet Take 5 mg by mouth daily.   . Probiotic Product (RISA-BID PROBIOTIC) TABS Take 1 tablet by mouth 2 (two) times daily.  . sennosides-docusate sodium (SENOKOT-S) 8.6-50 MG tablet Take 2 tablets by mouth 3 (three) times daily.   . sertraline (ZOLOFT) 100 MG tablet Take 100 mg by mouth daily.   . Skin Protectants, Misc. (ENDIT EX) Apply liberal amount topically to areas of skin irritation 2 times daily and prn. Ok to leave at bedside.  . sorbitol 70 % solution Take 30 mLs by mouth every 2 (two) hours as needed (constipation).   . triamcinolone cream (KENALOG) 0.1 % Apply 1 application topically 2 (two) times daily as needed (psoriatic rash).   . trolamine salicylate (ASPERCREME) 10 % cream Apply 1 application topically 3 (three) times daily as needed (neck pain). Apply thin film to the neck   No facility-administered encounter medications on file as of 11/02/2018.      SIGNIFICANT DIAGNOSTIC EXAMS   10-02-18: bladder surgical pathology: UROTHELIAL CARCINOMA   LABS REVIEWED PREVIOUS:   05-21-18: wbc 6.5; hgb 12.3; hct 36.2; mcv 105.6; plt 217; glucose 107; bun 13; creat 0.97; k+ 4.1 na++ 135 ca 8.9;  liver normal albumin 4.3 PSA 0.03 07-06-18: urine culture: no growth  TODAY:   08-04-18:wbc 5.2; hgb 11.3; hct 33.1; mcv 100.6; plt 233;  09-01-18: urine culture: no growth 09-21-18: glucose 129; bun 22; creat 1.21; k+ 4.0; na++ 138; ca 8.5  urine culture: staphylococcus aureus 10-14-18: glucose 95; bun 22; creat 0.94; k+ 4.1; na++ 140; ca 8.9 liver normal albumin 3.9      Review of Systems  Reason unable to perform ROS: poor historian   Constitutional: Negative for malaise/fatigue.  Respiratory: Negative for cough and shortness of breath.   Cardiovascular: Negative for chest pain and leg swelling.  Gastrointestinal: Negative for constipation and heartburn.  Musculoskeletal: Negative for back pain, joint pain and myalgias.  Skin: Negative.   Neurological: Negative for dizziness.  Psychiatric/Behavioral: The patient is not nervous/anxious.     Physical Exam  Constitutional: No distress.  Thin   Neck: No thyromegaly present.  Cardiovascular: Normal rate, regular rhythm, normal heart sounds and intact distal pulses.  Pulmonary/Chest: Effort normal and breath sounds normal. No respiratory distress.  Abdominal: Soft. Bowel sounds are normal. He exhibits no distension. There is no tenderness.  Musculoskeletal: He exhibits no edema.  Bilateral hand tremor Uses wheelchair    Lymphadenopathy:    He has no cervical adenopathy.  Neurological: He is alert.  Skin: Skin is warm and dry. He is not diaphoretic.  Psychiatric: He has a normal mood and affect.       ASSESSMENT/ PLAN:  TODAY:   1. Cerebral infarction: is neurologically stable; will monitor   2. Parkinson's disease: is without change; will continue sinemet 50/200 mg twice daily and 25/250 mg  2 tabs three times daily    3.  Idiopathic thrombocytopentia purpura: is without change will continue prednisone 5 mg daily   4. Spinal stenosis lumbar region, without neurogenic claudication/ psoriatic arthritis: is stable will continue  tylenol 500 mg twice daily has roxanol 5 or 10 mg every hours as needed for pain   PREVIOUS  5. Malignant neoplasm of prostate/BPH: is status post transurethral resection of bladder tumor with gencitabine:  PSA normal; will continue uroxatral 10 mg daily   6. Psychosis in elderly with behavioral disturbance: has had hallucinations; hypersexuality; agitation: is stable is presently off seroquel at this time.   7.   Late onset alzheimer's disease with behavioral disturbance: is losing weight; weight is 142 (previous139); pounds; will continue to monitor his status.   8. Major depression disorder, recurrent: is stable will continue  melatonin 3 mg nightly is taking klonopin 0.5 in the afternoon and 1 mg nightly .  Will continue wellbutrin 75 mg daily and zoloft  100 mg daily  9. Unintentional weight loss of more than 10 pounds in 90 days: his current weight is 142 (prevous139) pounds: will continue remeron 7.5 mg nightly for 30 days and will monitor his response.    10. Atrial fibrillation: heart rate stable; will continue asa 325 mg daily   11.  Chronic systolic congestive heart failure: is stable will continue to monitor his status.   12. Chronic idiopathic constipation: is stable will continue miralax twice daily and senna s 2 tabs three times daily   13. Orthostatic hypotension: is stable will continue midodrine 2.5 mg three times daily    MD is aware of resident's narcotic use and is in agreement with current plan of care. We will attempt to wean resident as apropriate   Ok Edwards NP [facility]  Contact [phone] Monday through Friday 8am- 5pm  After hours call [phone]

## 2018-11-12 ENCOUNTER — Other Ambulatory Visit: Payer: Self-pay | Admitting: Adult Health

## 2018-11-12 MED ORDER — CLONAZEPAM 0.5 MG PO TABS: 0.5000 mg | ORAL_TABLET | Freq: Every day | ORAL | 0 refills | Status: DC

## 2018-11-12 MED ORDER — CLONAZEPAM 1 MG PO TABS: 1.0000 mg | ORAL_TABLET | Freq: Every day | ORAL | 0 refills | Status: DC

## 2018-11-16 ENCOUNTER — Encounter: Payer: Self-pay | Admitting: Adult Health

## 2018-11-16 NOTE — Progress Notes (Signed)
[facility]   [facility] Room Number: [place] of Service:  SNF (31)   CODE STATUS: DNR  Allergies  Allergen Reactions  . Exelon [Rivastigmine Tartrate]     Increased agitation/irritability  . Other     Strawberries  . Penicillins     Chief Complaint  Patient presents with  . Acute Visit    Increased confusion    HPI:    Past Medical History:  Diagnosis Date  . Alzheimer's disease ([place]) 11/29/2015  . Anemia, unspecified    pernicious  . Anxiety    unspecified  . Arthritis   . Atrial fibrillation ([place])   . CHF (congestive heart failure) ([place]) 01/2006  . History of kidney stones   . Hyperlipidemia   . Kidney stones   . Nephrolithiasis   . Parkinson's disease ([place])   . Peripheral neuropathy   . Prostate cancer ([place]) 11/2006  . Psoriasis   . Psoriasis, unspecified   . Skin cancer   . Sleep disturbance    REM  . Spinal stenosis    s/p lumbar laminectomy  . Unspecified atrial fibrillation ([place])   . Upper GI bleed    unspecified; history of AVMs  . Valvular heart disease   . Vertigo   . Vertigo     Past Surgical History:  Procedure Laterality Date  . CATARACT EXTRACTION    . CYSTOSCOPY WITH FULGERATION N/A 10/02/2018   Procedure: CYSTOSCOPY WITH FULGERATION;  Surgeon: Billey Co, MD;  Location: [facility] ORS;  Service: Urology;  Laterality: N/A;  . EYE SURGERY    . INTERAL BLEEDING  10/2011   stomach  . MOHS SURGERY     Facial skin   . PROSTATE SURGERY    . SPINE SURGERY     lumbar laminectomy  . TRANSURETHRAL RESECTION OF BLADDER TUMOR WITH MITOMYCIN-C N/A 10/02/2018   Procedure: TRANSURETHRAL RESECTION OF BLADDER TUMOR WITH GEMCITABINE;  Surgeon: Billey Co, MD;  Location: [facility] ORS;  Service: Urology;  Laterality: N/A;    Social History   Socioeconomic History  . Marital status: Widowed    Spouse name: Not on file  . Number of children: 2  . Years of education: College  . 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 Financial resource strain: Not on file  . Food insecurity:    Worry: Not on file    Inability: Not on file  . Transportation needs:    Medical: Not on file    Non-medical: Not on file  Tobacco Use  . Smoking status: Former Smoker    Types: Cigarettes    Last attempt to quit: 03/30/1986    Years since quitting: 32.6  . Smokeless tobacco: Never Used  Substance and Sexual Activity  . Alcohol use: No  . Drug use: No  . Sexual activity: Not on file  Lifestyle  . Physical activity:    Days per week: Not on file    Minutes per session: Not on file  . Stress: Not on file  Relationships  . Social connections:    Talks on phone: Not on file    Gets together: Not on file    Attends religious service: Not on file    Active member of club or organization: Not on file    Attends meetings of clubs or organizations: Not on file    Relationship status: Not on file  .  Intimate partner violence:    Fear of current or ex partner: Not on file    Emotionally abused: Not on file    Physically abused: Not on file    Forced sexual activity: Not on file  Other Topics Concern  . Not on file  Social History Narrative   Admit Date: 01/26/2016 to [place]   Widowed   2 children   Denies alcohol use   Former smoker   DNR   Family History  Problem Relation Age of Onset  . Hypertension Mother   . Heart disease Father   . Heart disease Brother       VITAL SIGNS BP 134/63   Pulse 70   Temp 97.8 F (36.6 C)   Resp 20   Ht 5\' 8"  (1.727 m)   Wt 142 lb 8 oz (64.6 kg)   SpO2 96%   BMI 21.67 kg/m   Outpatient Encounter Medications as of 11/16/2018  Medication Sig  . acetaminophen (TYLENOL) 325 MG tablet Take 650 mg by mouth every 4 (four) hours as needed. for pain/ increased temp. May be administered orally, per G-tube if needed or rectally if unable to swallow (separate order). Maximum of 3000 mg  Acetaminophen in 24 hours.  Marland Kitchen acetaminophen (TYLENOL) 500 MG tablet Take 500 mg by mouth 2 (two) times daily. Maximum of 3000 mg of acetaminophen in 24 hours. Check prn acetaminophen order prior to administration due to limit.  Marland Kitchen alfuzosin (UROXATRAL) 10 MG 24 hr tablet Take 10 mg by mouth every evening.   . bisacodyl (DULCOLAX) 10 MG suppository Place 10 mg rectally daily as needed for moderate constipation.  . BUPROPION HCL ER, SR, PO Take 75 mg by mouth daily.   . carbidopa-levodopa (SINEMET CR) 50-200 MG tablet Take 1 tablet by mouth 2 (two) times daily.   . carbidopa-levodopa (SINEMET IR) 25-250 MG tablet Take 2 tablets by mouth 3 (three) times daily.  . carboxymethylcellulose (REFRESH TEARS) 0.5 % SOLN Place 2 drops into both eyes 2 (two) times daily as needed (dry eyes).  . clonazePAM (KLONOPIN) 0.5 MG tablet Take 1 tablet (0.5 mg total) by mouth daily. For afternoon agitation, restlessness  . clonazePAM (KLONOPIN) 1 MG tablet Take 1 tablet (1 mg total) by mouth at bedtime.  . coal tar (NEUTROGENA T-GEL) 0.5 % shampoo Apply 1 application topically 2 (two) times a week. Enough to lather   . cyanocobalamin 1000 MCG tablet Take 1,000 mcg by mouth daily.  . hydrocortisone cream 1 % Apply thin film topically  to clean, dry hemorrhoids 4 times a day as needed  . magnesium hydroxide (MILK OF MAGNESIA) 400 MG/5ML suspension Take 30 mLs by mouth every 4 (four) hours as needed for mild constipation.  . Melatonin 3 MG TABS Take 1 tablet by mouth at bedtime.  . midodrine (PROAMATINE) 2.5 MG tablet Take 2.5 mg by mouth 3 (three) times daily with meals. Take along with sinemet  . morphine (ROXANOL) 20 MG/ML concentrated solution Take 5-10 mg by mouth every hour as needed for severe pain. 0.25 mL for mild to moderate pain and/or dyspnea. 0.50 mL for moderate to severe pain and/or dyspnea.  . NON FORMULARY Diet type:  Regular  . Nutritional Supplements (NUTRITIONAL SUPPLEMENT PO) Magic Cup 2 times daily  with lunch and supper trays  . ondansetron (ZOFRAN) 4 MG tablet Take 4 mg by mouth every 4 (four) hours as needed for nausea or vomiting.  . Phenol 0.5 % LIQD Use as  directed 2 sprays in the mouth or throat every 2 (two) hours as needed (sore throat).   . polyethylene glycol (MIRALAX / GLYCOLAX) packet Take 17 g by mouth 2 (two) times daily. Mix with 4-6 ounces of fluid  . predniSONE (DELTASONE) 5 MG tablet Take 5 mg by mouth daily.   . Probiotic Product (RISA-BID PROBIOTIC) TABS Take 1 tablet by mouth 2 (two) times daily.  . sennosides-docusate sodium (SENOKOT-S) 8.6-50 MG tablet Take 2 tablets by mouth 3 (three) times daily.   . sertraline (ZOLOFT) 100 MG tablet Take 100 mg by mouth daily.   . Skin Protectants, Misc. (ENDIT EX) Apply liberal amount topically to areas of skin irritation 2 times daily and prn. Ok to leave at bedside.  . sorbitol 70 % solution Take 30 mLs by mouth every 2 (two) hours as needed (constipation).   . triamcinolone cream (KENALOG) 0.1 % Apply 1 application topically 2 (two) times daily as needed (psoriatic rash).   . trolamine salicylate (ASPERCREME) 10 % cream Apply 1 application topically 3 (three) times daily as needed (neck pain). Apply thin film to the neck  . [DISCONTINUED] mirtazapine (REMERON) 15 MG tablet Take 7.5 mg by mouth at bedtime.   No facility-administered encounter medications on file as of 11/16/2018.      SIGNIFICANT DIAGNOSTIC EXAMS  10-02-18: bladder surgical pathology: UROTHELIAL CARCINOMA   LABS REVIEWED PREVIOUS:   05-21-18: wbc 6.5; hgb 12.3; hct 36.2; mcv 105.6; plt 217; glucose 107; bun 13; creat 0.97; k+ 4.1 na++ 135 ca 8.9; liver normal albumin 4.3 PSA 0.03 07-06-18: urine culture: no growth  TODAY:   08-04-18:wbc 5.2; hgb 11.3; hct 33.1; mcv 100.6; plt 233;  09-01-18: urine culture: no growth 09-21-18: glucose 129; bun 22; creat 1.21; k+ 4.0; na++ 138; ca 8.5  urine culture: staphylococcus aureus 10-14-18: glucose 95; bun 22; creat  0.94; k+ 4.1; na++ 140; ca 8.9 liver normal albumin 3.9      Review of Systems  Reason unable to perform ROS: poor historian   Constitutional: Negative for malaise/fatigue.  Respiratory: Negative for cough and shortness of breath.   Cardiovascular: Negative for chest pain and leg swelling.  Gastrointestinal: Negative for constipation and heartburn.  Musculoskeletal: Negative for back pain, joint pain and myalgias.  Skin: Negative.   Neurological: Negative for dizziness.  Psychiatric/Behavioral: The patient is not nervous/anxious.     Physical Exam  Constitutional: No distress.  Thin   Neck: No thyromegaly present.  Cardiovascular: Normal rate, regular rhythm, normal heart sounds and intact distal pulses.  Pulmonary/Chest: Effort normal and breath sounds normal. No respiratory distress.  Abdominal: Soft. Bowel sounds are normal. He exhibits no distension. There is no tenderness.  Musculoskeletal: He exhibits no edema.  Bilateral hand tremor Uses wheelchair    Lymphadenopathy:    He has no cervical adenopathy.  Neurological: He is alert.  Skin: Skin is warm and dry. He is not diaphoretic.  Psychiatric: He has a normal mood and affect.       ASSESSMENT/ PLAN:  TODAY:   1. Cerebral infarction: is neurologically stable; will monitor   2. Parkinson's disease: is without change; will continue sinemet 50/200 mg twice daily and 25/250 mg 2 tabs three times daily    3.  Idiopathic thrombocytopentia purpura: is without change will continue prednisone 5 mg daily   4. Spinal stenosis lumbar region, without neurogenic claudication/ psoriatic arthritis: is stable will continue tylenol 500 mg twice daily has roxanol 5 or 10 mg every hours  as needed for pain   PREVIOUS  5. Malignant neoplasm of prostate/BPH: is status post transurethral resection of bladder tumor with gencitabine:  PSA normal; will continue uroxatral 10 mg daily   6. Psychosis in elderly with behavioral disturbance:  has had hallucinations; hypersexuality; agitation: is stable is presently off seroquel at this time.   7.   Late onset alzheimer's disease with behavioral disturbance: is losing weight; weight is 142 (previous139); pounds; will continue to monitor his status.   8. Major depression disorder, recurrent: is stable will continue  melatonin 3 mg nightly is taking klonopin 0.5 in the afternoon and 1 mg nightly .  Will continue wellbutrin 75 mg daily and zoloft  100 mg daily  9. Unintentional weight loss of more than 10 pounds in 90 days: his current weight is 142 (prevous139) pounds: will continue remeron 7.5 mg nightly for 30 days and will monitor his response.    10. Atrial fibrillation: heart rate stable; will continue asa 325 mg daily   11.  Chronic systolic congestive heart failure: is stable will continue to monitor his status.   12. Chronic idiopathic constipation: is stable will continue miralax twice daily and senna s 2 tabs three times daily   13. Orthostatic hypotension: is stable will continue midodrine 2.5 mg three times daily   MD is aware of resident's narcotic use and is in agreement with current plan of care. We will attempt to wean resident as apropriate   Ok Edwards NP [facility]  Contact [phone] Monday through Friday 8am- 5pm  After hours call [phone]

## 2018-11-20 NOTE — Progress Notes (Signed)
This encounter was created in error - please disregard.

## 2018-12-03 ENCOUNTER — Non-Acute Institutional Stay (SKILLED_NURSING_FACILITY): Payer: Medicare Other | Admitting: Adult Health

## 2018-12-03 ENCOUNTER — Encounter: Payer: Self-pay | Admitting: Adult Health

## 2018-12-03 DIAGNOSIS — F03918 Unspecified dementia, unspecified severity, with other behavioral disturbance: Secondary | ICD-10-CM

## 2018-12-03 DIAGNOSIS — G301 Alzheimer's disease with late onset: Secondary | ICD-10-CM | POA: Diagnosis not present

## 2018-12-03 DIAGNOSIS — F0391 Unspecified dementia with behavioral disturbance: Secondary | ICD-10-CM | POA: Diagnosis not present

## 2018-12-03 DIAGNOSIS — F0281 Dementia in other diseases classified elsewhere with behavioral disturbance: Secondary | ICD-10-CM

## 2018-12-03 DIAGNOSIS — C61 Malignant neoplasm of prostate: Secondary | ICD-10-CM | POA: Diagnosis not present

## 2018-12-03 DIAGNOSIS — F339 Major depressive disorder, recurrent, unspecified: Secondary | ICD-10-CM

## 2018-12-03 DIAGNOSIS — F02818 Dementia in other diseases classified elsewhere, unspecified severity, with other behavioral disturbance: Secondary | ICD-10-CM

## 2018-12-03 NOTE — Progress Notes (Signed)
Location:   [facility] Room Number: 536U Place of Service:  SNF (31)   CODE STATUS: DNR  Allergies  Allergen Reactions  . Exelon [Rivastigmine Tartrate]     Increased agitation/irritability  . Other     Strawberries  . Penicillins     Chief Complaint  Patient presents with  . Medical Management of Chronic Issues    Malignant neoplasm of prostate; late onset alzheimer's disease with behavioral disturbance; recurrent major depressive disorder remission status unspecified; psychosis in elderly with behavioral disturbance.     HPI:  He is a 84 year old long term resident of this facility being seen for the management of his chronic illnesses: prostate cancer; alzheimer's disease; depression; psychosis. He denies any uncontrolled pain. He feels as though his appetite is ok. He eats what he wants and usually eats more finger foods. He feels as though he is losing weight. He is willing to talk to dietician for food preferences.    Past Medical History:  Diagnosis Date  . Alzheimer's disease ([place]) 11/29/2015  . Anemia, unspecified    pernicious  . Anxiety    unspecified  . Arthritis   . Atrial fibrillation ([place])   . CHF (congestive heart failure) ([place]) 01/2006  . History of kidney stones   . Hyperlipidemia   . Kidney stones   . Nephrolithiasis   . Parkinson's disease ([place])   . Peripheral neuropathy   . Prostate cancer ([place]) 11/2006  . Psoriasis   . Psoriasis, unspecified   . Skin cancer   . Sleep disturbance    REM  . Spinal stenosis    s/p lumbar laminectomy  . Unspecified atrial fibrillation ([place])   . Upper GI bleed    unspecified; history of AVMs  . Valvular heart disease   . Vertigo   . Vertigo     Past Surgical History:  Procedure Laterality Date  . CATARACT EXTRACTION    . CYSTOSCOPY WITH FULGERATION N/A 10/02/2018   Procedure: CYSTOSCOPY WITH FULGERATION;  Surgeon: Billey Co, MD;  Location: [facility] ORS;  Service: Urology;   Laterality: N/A;  . EYE SURGERY    . INTERAL BLEEDING  10/2011   stomach  . MOHS SURGERY     Facial skin   . PROSTATE SURGERY    . SPINE SURGERY     lumbar laminectomy  . TRANSURETHRAL RESECTION OF BLADDER TUMOR WITH MITOMYCIN-C N/A 10/02/2018   Procedure: TRANSURETHRAL RESECTION OF BLADDER TUMOR WITH GEMCITABINE;  Surgeon: Billey Co, MD;  Location: [facility] ORS;  Service: Urology;  Laterality: N/A;    Social History   Socioeconomic History  . Marital status: Widowed    Spouse name: Not on file  . Number of children: 2  . Years of education: College  . 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 Financial resource strain: Not on file  . Food insecurity:    Worry: Not on file    Inability: Not on file  . Transportation needs:    Medical: Not on file    Non-medical: Not on file  Tobacco Use  . Smoking status: Former Smoker    Types: Cigarettes    Last attempt to quit: 03/30/1986    Years since quitting: 32.7  . Smokeless tobacco: Never Used  Substance and Sexual Activity  . Alcohol use: No  . Drug use: No  . Sexual activity: Not on file  Lifestyle  . Physical activity:    Days per week: Not on file    Minutes per session: Not on file  . Stress: Not on file  Relationships  . Social connections:    Talks on phone: Not on file    Gets together: Not on file    Attends religious service: Not on file    Active member of club or organization: Not on file    Attends meetings of clubs or organizations: Not on file    Relationship status: Not on file  . Intimate partner violence:    Fear of current or ex partner: Not on file    Emotionally abused: Not on file    Physically abused: Not on file    Forced sexual activity: Not on file  Other Topics Concern  . Not on file  Social History Narrative   Admit Date: 01/26/2016 to [place]   Widowed   2 children   Denies alcohol use   Former smoker    DNR   Family History  Problem Relation Age of Onset  . Hypertension Mother   . Heart disease Father   . Heart disease Brother       VITAL SIGNS BP 134/63   Pulse 70   Temp 97.8 F (36.6 C) (Oral)   Resp 20   Ht 5\' 8"  (1.727 m)   Wt 134 lb 11.2 oz (61.1 kg)   SpO2 96%   BMI 20.48 kg/m   Outpatient Encounter Medications as of 12/03/2018  Medication Sig  . acetaminophen (TYLENOL) 325 MG tablet Take 650 mg by mouth every 4 (four) hours as needed. for pain/ increased temp. May be administered orally, per G-tube if needed or rectally if unable to swallow (separate order). Maximum of 3000 mg Acetaminophen in 24 hours.  Marland Kitchen acetaminophen (TYLENOL) 500 MG tablet Take 500 mg by mouth 2 (two) times daily. Maximum of 3000 mg of acetaminophen in 24 hours. Check prn acetaminophen order prior to administration due to limit.  Marland Kitchen alfuzosin (UROXATRAL) 10 MG 24 hr tablet Take 10 mg by mouth every evening.   Marland Kitchen BUPROPION HCL ER, SR, PO Take 75 mg by mouth daily.   . carbidopa-levodopa (SINEMET CR) 50-200 MG tablet Take 1 tablet by mouth 2 (two) times daily.   . carbidopa-levodopa (SINEMET IR) 25-250 MG tablet Take 2 tablets by mouth 3 (three) times daily.  . clonazePAM (KLONOPIN) 0.5 MG tablet Take 1 tablet (0.5 mg total) by mouth daily. For afternoon agitation, restlessness  . clonazePAM (KLONOPIN) 1 MG tablet Take 1 tablet (1 mg total) by mouth at bedtime.  . cyanocobalamin 1000 MCG tablet Take 1,000 mcg by mouth daily.  . magnesium hydroxide (MILK OF MAGNESIA) 400 MG/5ML suspension Take 30 mLs by mouth every 4 (four) hours as needed for mild constipation.  . Melatonin 3 MG TABS Take 1 tablet by mouth at bedtime.  . midodrine (PROAMATINE) 2.5 MG tablet Take 2.5 mg by mouth 3 (three) times daily with meals. Take along with sinemet  . morphine (ROXANOL) 20 MG/ML concentrated solution Take 5-10 mg by mouth every hour as needed for severe pain. 0.25 mL for mild to moderate pain and/or dyspnea. 0.50 mL  for moderate to severe pain and/or dyspnea.  . NON FORMULARY Diet type:  Regular  . Nutritional Supplements (NUTRITIONAL SUPPLEMENT PO) Magic Cup 2 times daily with lunch and supper trays  . polyethylene glycol (MIRALAX / GLYCOLAX) packet Take 17 g by mouth 2 (two) times  daily. Mix with 4-6 ounces of fluid  . predniSONE (DELTASONE) 5 MG tablet Take 5 mg by mouth daily.   . Probiotic Product (RISA-BID PROBIOTIC) TABS Take 1 tablet by mouth 2 (two) times daily.  . sennosides-docusate sodium (SENOKOT-S) 8.6-50 MG tablet Take 2 tablets by mouth 3 (three) times daily.   . sertraline (ZOLOFT) 100 MG tablet Take 100 mg by mouth daily.   . Skin Protectants, Misc. (ENDIT EX) Apply liberal amount topically to areas of skin irritation 2 times daily and prn. Ok to leave at bedside.  . sorbitol 70 % solution Take 30 mLs by mouth every 2 (two) hours as needed (constipation).    No facility-administered encounter medications on file as of 12/03/2018.      SIGNIFICANT DIAGNOSTIC EXAMS  PREVIOUS  10-02-18: bladder surgical pathology: UROTHELIAL CARCINOMA  NO NEW EXAMS.    LABS REVIEWED PREVIOUS:   05-21-18: wbc 6.5; hgb 12.3; hct 36.2; mcv 105.6; plt 217; glucose 107; bun 13; creat 0.97; k+ 4.1 na++ 135 ca 8.9; liver normal albumin 4.3 PSA 0.03 07-06-18: urine culture: no growth 08-04-18:wbc 5.2; hgb 11.3; hct 33.1; mcv 100.6; plt 233;  09-01-18: urine culture: no growth 09-21-18: glucose 129; bun 22; creat 1.21; k+ 4.0; na++ 138; ca 8.5  urine culture: staphylococcus aureus 10-14-18: glucose 95; bun 22; creat 0.94; k+ 4.1; na++ 140; ca 8.9 liver normal albumin 3.9   NO NEW LABS.     Review of Systems  Reason unable to perform ROS: poor historian   Constitutional: Negative for malaise/fatigue.  Respiratory: Negative for cough and shortness of breath.   Cardiovascular: Negative for chest pain.  Gastrointestinal: Negative for abdominal pain.  Musculoskeletal: Negative for back pain, joint pain and  myalgias.  Skin: Negative.   Psychiatric/Behavioral: The patient is not nervous/anxious.    Physical Exam  Constitutional: No distress.  Thin   Neck: No thyromegaly present.  Cardiovascular: Normal rate, regular rhythm, normal heart sounds and intact distal pulses.  Pulmonary/Chest: Effort normal and breath sounds normal. No respiratory distress.  Abdominal: Soft. Bowel sounds are normal. He exhibits no distension. There is no tenderness.  Musculoskeletal: He exhibits no edema.  Bilateral hand tremor Uses wheelchair     Lymphadenopathy:    He has no cervical adenopathy.  Neurological: He is alert.  Skin: Skin is warm and dry. He is not diaphoretic.  Psychiatric: He has a normal mood and affect.      ASSESSMENT/ PLAN:  TODAY:   1. Malignant neoplasm of prostate/BPH: is status post transurethral resection of bladder tumor with gencitabine:  PSA normal; will continue uroxatral 10 mg daily is followed by urology   2. Psychosis in elderly with behavioral disturbance: has had hallucinations; hypersexuality; agitation: is stable is presently off seroquel at this time.   3.   Late onset alzheimer's disease with behavioral disturbance: is losing weight; weight is 134 (previous142); pounds; will continue to monitor his status.   4. Major depression disorder, recurrent: is stable will continue  melatonin 3 mg nightly is taking klonopin 0.5 in the afternoon and 1 mg nightly .  Will continue wellbutrin 75 mg daily and zoloft  100 mg daily  PREVIOUS  5. Unintentional weight loss of more than 10 pounds in 90 days: his current weight is 134 (prevous142) pounds:   6. Atrial fibrillation: heart rate stable; will monitor   7.  Chronic systolic congestive heart failure: is stable will continue to monitor his status.   8. Chronic idiopathic constipation: is  stable will continue miralax twice daily and senna s 2 tabs three times daily   9. Orthostatic hypotension: is stable will continue  midodrine 2.5 mg three times daily   10. Cerebral infarction: is neurologically stable; will monitor   11. Parkinson's disease: is without change; will continue sinemet CR 50/200 mg twice daily and IR 25/250 mg 2 tabs three times daily    12.  Idiopathic thrombocytopentia purpura: is without change will continue prednisone 5 mg daily   13. Spinal stenosis lumbar region, without neurogenic claudication/ psoriatic arthritis: is stable will continue tylenol 500 mg twice daily has roxanol 5 or 10 mg every hours as needed for pain  14. 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 We will attempt to wean resident as apropriate   Ok Edwards NP [facility]  Contact [phone] Monday through Friday 8am- 5pm  After hours call [phone]

## 2018-12-08 ENCOUNTER — Other Ambulatory Visit: Payer: Self-pay | Admitting: Adult Health

## 2018-12-08 MED ORDER — CLONAZEPAM 1 MG PO TABS: 1.0000 mg | ORAL_TABLET | Freq: Every day | ORAL | 0 refills | Status: DC

## 2018-12-08 MED ORDER — CLONAZEPAM 0.5 MG PO TABS: 0.5000 mg | ORAL_TABLET | Freq: Every day | ORAL | 0 refills | Status: DC

## 2018-12-31 ENCOUNTER — Other Ambulatory Visit: Payer: Self-pay | Admitting: Adult Health

## 2018-12-31 MED ORDER — CLONAZEPAM 0.5 MG PO TABS: 0.5000 mg | ORAL_TABLET | Freq: Every day | ORAL | 0 refills | Status: DC

## 2018-12-31 MED ORDER — CLONAZEPAM 1 MG PO TABS: 1.0000 mg | ORAL_TABLET | Freq: Every day | ORAL | 0 refills | Status: DC

## 2019-01-05 ENCOUNTER — Non-Acute Institutional Stay (SKILLED_NURSING_FACILITY): Payer: Medicare Other | Admitting: Adult Health

## 2019-01-05 ENCOUNTER — Encounter: Payer: Self-pay | Admitting: Adult Health

## 2019-01-05 DIAGNOSIS — G903 Multi-system degeneration of the autonomic nervous system: Secondary | ICD-10-CM

## 2019-01-05 DIAGNOSIS — I4891 Unspecified atrial fibrillation: Secondary | ICD-10-CM

## 2019-01-05 DIAGNOSIS — K5904 Chronic idiopathic constipation: Secondary | ICD-10-CM

## 2019-01-05 DIAGNOSIS — I5022 Chronic systolic (congestive) heart failure: Secondary | ICD-10-CM | POA: Diagnosis not present

## 2019-01-05 NOTE — Progress Notes (Signed)
Location:   [facility] Room Number: [place] of Service:  SNF (31)   CODE STATUS: DNR  Allergies  Allergen Reactions  . Exelon [Rivastigmine Tartrate]     Increased agitation/irritability  . Other     Strawberries  . Penicillins     Chief Complaint  Patient presents with  . Medical Management of Chronic Issues    Atrial fibrillation unspecified type; chronic systolic congestive heart failure; chronic idiopathic constipation; orthostatic hypotension due to parkinson disease.     HPI:  He is a 85 year old long term resident of this facility being seen for the management of her chronic illnesses: afib; heart failure; constipation; orthostatic hypotension. He denies any changes in his appetite; no complaints of constipation; no insomnia; no anxiety.   Past Medical History:  Diagnosis Date  . Alzheimer's disease ([place]) 11/29/2015  . Anemia, unspecified    pernicious  . Anxiety    unspecified  . Arthritis   . Atrial fibrillation ([place])   . CHF (congestive heart failure) ([place]) 01/2006  . History of kidney stones   . Hyperlipidemia   . Kidney stones   . Nephrolithiasis   . Parkinson's disease ([place])   . Peripheral neuropathy   . Prostate cancer ([place]) 11/2006  . Psoriasis   . Psoriasis, unspecified   . Skin cancer   . Sleep disturbance    REM  . Spinal stenosis    s/p lumbar laminectomy  . Unspecified atrial fibrillation ([place])   . Upper GI bleed    unspecified; history of AVMs  . Valvular heart disease   . Vertigo   . Vertigo     Past Surgical History:  Procedure Laterality Date  . CATARACT EXTRACTION    . CYSTOSCOPY WITH FULGERATION N/A 10/02/2018   Procedure: CYSTOSCOPY WITH FULGERATION;  Surgeon: Billey Co, MD;  Location: [facility] ORS;  Service: Urology;  Laterality: N/A;  . EYE SURGERY    . INTERAL BLEEDING  10/2011   stomach  . MOHS SURGERY     Facial skin   . PROSTATE SURGERY    . SPINE SURGERY     lumbar laminectomy    . TRANSURETHRAL RESECTION OF BLADDER TUMOR WITH MITOMYCIN-C N/A 10/02/2018   Procedure: TRANSURETHRAL RESECTION OF BLADDER TUMOR WITH GEMCITABINE;  Surgeon: Billey Co, MD;  Location: [facility] ORS;  Service: Urology;  Laterality: N/A;    Social History   Socioeconomic History  . Marital status: Widowed    Spouse name: Not on file  . Number of children: 2  . Years of education: College  . 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 Financial resource strain: Not on file  . Food insecurity:    Worry: Not on file    Inability: Not on file  . Transportation needs:    Medical: Not on file    Non-medical: Not on file  Tobacco Use  . Smoking status: Former Smoker    Types: Cigarettes    Last attempt to quit: 03/30/1986    Years since quitting: 32.7  . Smokeless tobacco: Never Used  Substance and Sexual Activity  . Alcohol use: No  . Drug use: No  . Sexual activity: Not on file  Lifestyle  . Physical activity:    Days per week: Not on file    Minutes per session: Not on file  . Stress: Not on file  Relationships  .  Social connections:    Talks on phone: Not on file    Gets together: Not on file    Attends religious service: Not on file    Active member of club or organization: Not on file    Attends meetings of clubs or organizations: Not on file    Relationship status: Not on file  . Intimate partner violence:    Fear of current or ex partner: Not on file    Emotionally abused: Not on file    Physically abused: Not on file    Forced sexual activity: Not on file  Other Topics Concern  . Not on file  Social History Narrative   Admit Date: 01/26/2016 to [place]   Widowed   2 children   Denies alcohol use   Former smoker   DNR   Family History  Problem Relation Age of Onset  . Hypertension Mother   . Heart disease Father   . Heart disease Brother       VITAL SIGNS BP 136/75   Pulse  73   Temp 98.8 F (37.1 C)   Resp 18   Ht 5\' 8"  (1.727 m)   Wt 136 lb 9.6 oz (62 kg)   SpO2 98%   BMI 20.77 kg/m   Outpatient Encounter Medications as of 01/05/2019  Medication Sig  . acetaminophen (TYLENOL) 325 MG tablet Take 650 mg by mouth every 4 (four) hours as needed. for pain/ increased temp. May be administered orally, per G-tube if needed or rectally if unable to swallow (separate order). Maximum of 3000 mg Acetaminophen in 24 hours.  Marland Kitchen acetaminophen (TYLENOL) 500 MG tablet Take 500 mg by mouth 2 (two) times daily. Maximum of 3000 mg of acetaminophen in 24 hours. Check prn acetaminophen order prior to administration due to limit.  Marland Kitchen alfuzosin (UROXATRAL) 10 MG 24 hr tablet Take 10 mg by mouth every evening.   Marland Kitchen BUPROPION HCL ER, SR, PO Take 75 mg by mouth daily.   . carbidopa-levodopa (SINEMET CR) 50-200 MG tablet Take 1 tablet by mouth at bedtime.   . carbidopa-levodopa (SINEMET IR) 25-250 MG tablet Take 1 tablet by mouth 3 (three) times daily.   . clonazePAM (KLONOPIN) 0.5 MG tablet Take 1 tablet (0.5 mg total) by mouth daily. For afternoon agitation, restlessness  . clonazePAM (KLONOPIN) 1 MG tablet Take 1 tablet (1 mg total) by mouth at bedtime.  . cyanocobalamin 1000 MCG tablet Take 1,000 mcg by mouth daily.  . hydrocortisone cream 1 % Apply 1 application topically 4 (four) times daily as needed for itching. For Psoriasis  . magnesium hydroxide (MILK OF MAGNESIA) 400 MG/5ML suspension Take 30 mLs by mouth every 4 (four) hours as needed for mild constipation.  . Melatonin 3 MG TABS Take 1 tablet by mouth at bedtime.  . midodrine (PROAMATINE) 2.5 MG tablet Take 2.5 mg by mouth 3 (three) times daily with meals. Take along with sinemet  . morphine (ROXANOL) 20 MG/ML concentrated solution Take 5-10 mg by mouth every hour as needed for severe pain. 0.25 mL for mild to moderate pain and/or dyspnea. 0.50 mL for moderate to severe pain and/or dyspnea.  . NON FORMULARY Diet type:   Regular  . Nutritional Supplements (NUTRITIONAL SUPPLEMENT PO) Magic Cup 2 times daily with lunch and supper trays  . polyethylene glycol (MIRALAX / GLYCOLAX) packet Take 17 g by mouth 2 (two) times daily. Mix with 4-6 ounces of fluid  . predniSONE (DELTASONE) 5 MG tablet Take  5 mg by mouth daily.   . Probiotic Product (RISA-BID PROBIOTIC) TABS Take 1 tablet by mouth 2 (two) times daily.  . sennosides-docusate sodium (SENOKOT-S) 8.6-50 MG tablet Take 2 tablets by mouth 3 (three) times daily.   . sertraline (ZOLOFT) 100 MG tablet Take 100 mg by mouth daily.   . Skin Protectants, Misc. (ENDIT EX) Apply liberal amount topically to areas of skin irritation 2 times daily and prn. Ok to leave at bedside.  . sorbitol 70 % solution Take 30 mLs by mouth every 2 (two) hours as needed (constipation).    No facility-administered encounter medications on file as of 01/05/2019.      SIGNIFICANT DIAGNOSTIC EXAMS  PREVIOUS  10-02-18: bladder surgical pathology: UROTHELIAL CARCINOMA  NO NEW EXAMS.    LABS REVIEWED PREVIOUS:   05-21-18: wbc 6.5; hgb 12.3; hct 36.2; mcv 105.6; plt 217; glucose 107; bun 13; creat 0.97; k+ 4.1 na++ 135 ca 8.9; liver normal albumin 4.3 PSA 0.03 07-06-18: urine culture: no growth 08-04-18:wbc 5.2; hgb 11.3; hct 33.1; mcv 100.6; plt 233;  09-01-18: urine culture: no growth 09-21-18: glucose 129; bun 22; creat 1.21; k+ 4.0; na++ 138; ca 8.5  urine culture: staphylococcus aureus 10-14-18: glucose 95; bun 22; creat 0.94; k+ 4.1; na++ 140; ca 8.9 liver normal albumin 3.9   NO NEW LABS.     Review of Systems  Reason unable to perform ROS: poor historian   Constitutional: Negative for malaise/fatigue.  Respiratory: Negative for shortness of breath.   Cardiovascular: Negative for chest pain, palpitations and leg swelling.  Gastrointestinal: Negative for constipation and heartburn.  Musculoskeletal: Negative for back pain and joint pain.  Skin: Negative.   Neurological: Negative for  dizziness.  Psychiatric/Behavioral: The patient is not nervous/anxious.     Physical Exam Constitutional:      General: He is not in acute distress.    Appearance: He is well-developed. He is not diaphoretic.     Comments: thin  Neck:     Musculoskeletal: Neck supple.     Thyroid: No thyromegaly.  Cardiovascular:     Rate and Rhythm: Normal rate and regular rhythm.     Pulses: Normal pulses.     Heart sounds: Normal heart sounds.  Pulmonary:     Effort: Pulmonary effort is normal. No respiratory distress.     Breath sounds: Normal breath sounds.  Abdominal:     General: Bowel sounds are normal. There is no distension.     Palpations: Abdomen is soft.     Tenderness: There is no abdominal tenderness.  Musculoskeletal:     Right lower leg: No edema.     Left lower leg: No edema.     Comments: Is able to move all extremities Has bilateral tremors present   Lymphadenopathy:     Cervical: No cervical adenopathy.  Skin:    General: Skin is warm and dry.  Neurological:     Mental Status: He is alert. Mental status is at baseline.  Psychiatric:        Mood and Affect: Mood normal.       ASSESSMENT/ PLAN:  TODAY:   1. Atrial fibrillation: heart rate stable; will monitor   2.  Chronic systolic congestive heart failure: is stable will continue to monitor his status.   3. Chronic idiopathic constipation: is stable will continue miralax twice daily and senna s 2 tabs three times daily   4. Orthostatic hypotension: is stable will continue midodrine 2.5 mg three times daily  PREVIOUS  5. Unintentional weight loss of more than 10 pounds in 90 days: his current weight is 136 (prevous134) pounds: will continue supplements as directed   6. Cerebral infarction: is neurologically stable; will monitor   7. Parkinson's disease: is without change; will continue sinemet CR 50/200 mg twice daily and IR 25/250 mg 2 tabs three times daily    8.  Idiopathic thrombocytopentia purpura:  is without change will continue prednisone 5 mg daily   9. Spinal stenosis lumbar region, without neurogenic claudication/ psoriatic arthritis: is stable will continue tylenol 500 mg twice daily prednisone 5 mg daily  has roxanol 5 or 10 mg every hours as needed for pain  10. 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 Malignant neoplasm of prostate/BPH: is status post transurethral resection of bladder tumor with gencitabine:  PSA normal; will continue uroxatral 10 mg daily is followed by urology   12. Psychosis in elderly with behavioral disturbance: has had hallucinations; hypersexuality; agitation: is stable is presently off seroquel at this time.   13.   Late onset alzheimer's disease with behavioral disturbance: is losing weight; weight is 136 (previous134); pounds; will continue to monitor his status.   14. Major depression disorder, recurrent: is stable will continue  melatonin 3 mg nightly is taking klonopin 0.5 in the afternoon and 1 mg nightly .  Will continue wellbutrin 75 mg daily and zoloft  100 mg daily    MD is aware of resident's narcotic use and is in agreement with current plan of care. We will attempt to wean resident as apropriate   Ok Edwards NP [facility]  Contact [phone] Monday through Friday 8am- 5pm  After hours call [phone]

## 2019-01-06 ENCOUNTER — Other Ambulatory Visit
Admission: RE | Admit: 2019-01-06 | Discharge: 2019-01-06 | Disposition: A | Discharge status: Home / Self Care | Payer: Medicare Other | Source: Ambulatory Visit | Attending: Adult Health | Admitting: Adult Health

## 2019-01-06 DIAGNOSIS — I5022 Chronic systolic (congestive) heart failure: Secondary | ICD-10-CM | POA: Diagnosis present

## 2019-01-06 LAB — CBC WITH DIFFERENTIAL/PLATELET
ABS IMMATURE GRANULOCYTES: 0.02 10*3/uL (ref 0.00–0.07)
Basophils Absolute: 0 10*3/uL (ref 0.0–0.1)
Basophils Relative: 0 %
EOS PCT: 4 %
Eosinophils Absolute: 0.2 10*3/uL (ref 0.0–0.5)
HEMATOCRIT: 31.2 % — AB (ref 39.0–52.0)
Hemoglobin: 9.9 g/dL — ABNORMAL LOW (ref 13.0–17.0)
Immature Granulocytes: 0 %
LYMPHS PCT: 23 %
Lymphs Abs: 1.2 10*3/uL (ref 0.7–4.0)
MCH: 32.5 pg (ref 26.0–34.0)
MCHC: 31.7 g/dL (ref 30.0–36.0)
MCV: 102.3 fL — AB (ref 80.0–100.0)
MONO ABS: 0.7 10*3/uL (ref 0.1–1.0)
MONOS PCT: 13 %
Neutro Abs: 2.9 10*3/uL (ref 1.7–7.7)
Neutrophils Relative %: 60 %
Platelets: 196 10*3/uL (ref 150–400)
RBC: 3.05 MIL/uL — AB (ref 4.22–5.81)
RDW: 13.6 % (ref 11.5–15.5)
WBC: 5 10*3/uL (ref 4.0–10.5)
nRBC: 0 % (ref 0.0–0.2)

## 2019-01-06 LAB — URIC ACID: Uric Acid, Serum: 4 mg/dL (ref 3.7–8.6)

## 2019-01-07 ENCOUNTER — Encounter: Payer: Self-pay | Admitting: Nurse Practitioner

## 2019-01-07 ENCOUNTER — Non-Acute Institutional Stay: Payer: Medicare Other | Admitting: Nurse Practitioner

## 2019-01-07 VITALS — HR 78 | Resp 18 | Wt 136.9 lb

## 2019-01-07 DIAGNOSIS — Z515 Encounter for palliative care: Secondary | ICD-10-CM

## 2019-01-07 DIAGNOSIS — R630 Anorexia: Secondary | ICD-10-CM

## 2019-01-07 DIAGNOSIS — R531 Weakness: Secondary | ICD-10-CM

## 2019-01-07 DIAGNOSIS — R413 Other amnesia: Secondary | ICD-10-CM | POA: Insufficient documentation

## 2019-01-07 NOTE — Progress Notes (Signed)
[facility] Telephone: [phone] Fax: [phone]  PATIENT NAME: [name] [name] DOB: 08/26/1930 MRN: [id number]  PRIMARY CARE PROVIDER:   Kirk Ruths, MD  REFERRING PROVIDER:  Dr Anderson/[facility] RESPONSIBLE PARTY:   Derl Barrow daughter [phone]  ASSESSMENT:     I visited and observed [name] [name]. We talked about purpose for palliative medicine visit. We talked about symptoms of pain which he denies. We talked about symptoms of shortness of breath and fatigue and [name] [name] endorses he is doing just fine. [name] [name] endorses he is more upset about having to live in a jail cell where he's not able to go out of his room without a chaperone. He is oriented to person in place though very disheartened with loss of [place]. We talked about fall risk. We talked about his wishes to be able to be more mobile. He talked about getting his electric wheelchair fixed. He talked about being able to go out of his room, off the unit and outside. We talked about the bass he has mounted on his wall. He talked about being able to go fishing in the pond. He talked about the loss of not being able to do the things he previously was able to do. We talked about activities at the facility. We talked about social interaction. We talked about cooking strategies. We talked about his appetite and weight loss. Encourage to continue supplements, and snacks. DNR remains in place. We talked about role of palliative care in the plan of care. He has an upcoming pending cystoscopy planned for January 2020. Will see what that reveals with weight loss could be progression of malignancy and may revisit Hospice Services. Therapeutic listening and emotional support provided. I have attempted to contact his daughter for update. I have updated nursing staff will continue to Monitor and follow with next visit in 4 weeks if needed or sooner should he declined.  1 / 8 / 2020 WBC 5.0, hemoglobin  9.9, hematocrit 31.8, platelets 196   PC 1 / 31 / 2017 to 1 / 31 / 2017 Hospice 2 / 2 / 2017 to 4 / 24 / 2019 PC 4 / 29 / 2019  4 / 1 / 2019 weight 145.9 lbs 1 / 7 / 2020 weight 136.9 lbs  RECOMMENDATIONS and PLAN:   1.Palliative care encounter Z51.5; Palliative medicine team will continue to support patient, patient's family, and medical team. Visit consisted of counseling and education dealing with the complex and emotionally intense issues of symptom management and palliative care in the setting of serious and potentially life-threatening illness  2. Memory loss R41.3 appears progressive. Medical goals to continue to focus on Comfort, redirecting with supportive measures.  3. Generalized weakness R53.1  secondary to Parkinson's disease continue with therapy as able. Encourage energy conservation and rest times.  4. Anorexia R63.0. secondary to parkinson disease, CHF, Alzheimer's disease. Support encourage to continue to go to the dining area for meals. Assistance as needed continues to encourage to feed self. Continue supplements, snacks and weights.  I spent 45 minutes providing this consultation,  from 11:15am to 12:00pm. More than 50% of the time in this consultation was spent coordinating communication.   HISTORY OF PRESENT ILLNESS:  [name] [name] is a 85 y.o. year old male with multiple medical problems including Congestive heart failure, atrial fibrillation, history of AVM, Alzheimer's dementia, Parkinson's disease, anemia, arthritis, prostate cancer s/p surgery, peripheral neuropathy, spinal stenosis s/p lumbar laminectomy, psoriasis, history of upper GI bleed,  that popular heart disease, has shaped kidney stones, history of nephrolithiasis, cataract extraction, depression, anxiety. [name] [name] continues to reside at skilled [facility]. He does transfer to the wheelchair. [name] [name] is able to propel himself in the wheelchair though prefers his electric  wheelchair. He does require assistance with ADLs. He does require assistance with toileting. He is able to feed himself an appetite has been Fair. He previously was under Hospice Services 2017 to 2019 but discharge due to stability. DNR remains in place. Last primary provider visit 1 / 7 / 2020 malignant neoplasm of prostate/BPH s/p transurethral resection of bladder tumor with gencitabine with normal PSA continued on uroxatral 10 mg daily. Late onset Alzheimer's disease with behavioral disturbances with slow weight loss. Unintentional 10 lb weight loss in 90 days with current weight 134.0. Depression recurrent stable on Wellbutrin 75 mg daily and Zoloft 200 mg daily in addition to klonopin 0.5 mg q afternoon and 1 mg qhs with melatonin 3 mg qhs. 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 Atrial fibrillation with stable heart rate, congestive heart failure stable without any recent exacerbations. He does continue on midodrine 2.5 mg tid for orthostatic hypotension. Parkinson's disease continued on sinemet. Spinal stenosis lumbar with psoriatic arthritis stable continued on Tylenol 500 mg bid and roxanol 5 to 10 mg q1hr as needed for pain. He is not required roxanol in the last 24 hours. At he is sitting in his wheelchair in his room. He appears comfortable. No visitors present. Palliative Care was asked to help address goals of care.   CODE STATUS: DNR  PPS: 40% HOSPICE ELIGIBILITY/DIAGNOSIS: TBD  PAST MEDICAL HISTORY:  Past Medical History:  Diagnosis Date  . Alzheimer's disease ([place]) 11/29/2015  . Anemia, unspecified    pernicious  . Anxiety    unspecified  . Arthritis   . Atrial fibrillation ([place])   . CHF (congestive heart failure) ([place]) 01/2006  . History of kidney stones   . Hyperlipidemia   . Kidney stones   . Nephrolithiasis   . Parkinson's disease ([place])   . Peripheral neuropathy   . Prostate cancer ([place]) 11/2006  . Psoriasis   .  Psoriasis, unspecified   . Skin cancer   . Sleep disturbance    REM  . Spinal stenosis    s/p lumbar laminectomy  . Unspecified atrial fibrillation ([place])   . Upper GI bleed    unspecified; history of AVMs  . Valvular heart disease   . Vertigo   . Vertigo     SOCIAL HX:  Social History   Tobacco Use  . Smoking status: Former Smoker    Types: Cigarettes    Last attempt to quit: 03/30/1986    Years since quitting: 32.7  . Smokeless tobacco: Never Used  Substance Use Topics  . Alcohol use: No    ALLERGIES:  Allergies  Allergen Reactions  . Exelon [Rivastigmine Tartrate]     Increased agitation/irritability  . Other     Strawberries  . Penicillins      PERTINENT MEDICATIONS:  Outpatient Encounter Medications as of 01/07/2019  Medication Sig  . acetaminophen (TYLENOL) 325 MG tablet Take 650 mg by mouth every 4 (four) hours as needed. for pain/ increased temp. May be administered orally, per G-tube if needed or rectally if unable to swallow (separate order). Maximum of 3000 mg Acetaminophen in 24 hours.  Marland Kitchen acetaminophen (TYLENOL) 500 MG tablet Take 500 mg by mouth 2 (two)  times daily. Maximum of 3000 mg of acetaminophen in 24 hours. Check prn acetaminophen order prior to administration due to limit.  Marland Kitchen alfuzosin (UROXATRAL) 10 MG 24 hr tablet Take 10 mg by mouth every evening.   Marland Kitchen BUPROPION HCL ER, SR, PO Take 75 mg by mouth daily.   . carbidopa-levodopa (SINEMET CR) 50-200 MG tablet Take 1 tablet by mouth at bedtime.   . carbidopa-levodopa (SINEMET IR) 25-250 MG tablet Take 1 tablet by mouth 3 (three) times daily.   . clonazePAM (KLONOPIN) 0.5 MG tablet Take 1 tablet (0.5 mg total) by mouth daily. For afternoon agitation, restlessness  . clonazePAM (KLONOPIN) 1 MG tablet Take 1 tablet (1 mg total) by mouth at bedtime.  . cyanocobalamin 1000 MCG tablet Take 1,000 mcg by mouth daily.  . hydrocortisone cream 1 % Apply 1 application topically 4 (four) times daily as needed for  itching. For Psoriasis  . magnesium hydroxide (MILK OF MAGNESIA) 400 MG/5ML suspension Take 30 mLs by mouth every 4 (four) hours as needed for mild constipation.  . Melatonin 3 MG TABS Take 1 tablet by mouth at bedtime.  . midodrine (PROAMATINE) 2.5 MG tablet Take 2.5 mg by mouth 3 (three) times daily with meals. Take along with sinemet  . morphine (ROXANOL) 20 MG/ML concentrated solution Take 5-10 mg by mouth every hour as needed for severe pain. 0.25 mL for mild to moderate pain and/or dyspnea. 0.50 mL for moderate to severe pain and/or dyspnea.  . NON FORMULARY Diet type:  Regular  . Nutritional Supplements (NUTRITIONAL SUPPLEMENT PO) Magic Cup 2 times daily with lunch and supper trays  . polyethylene glycol (MIRALAX / GLYCOLAX) packet Take 17 g by mouth 2 (two) times daily. Mix with 4-6 ounces of fluid  . predniSONE (DELTASONE) 5 MG tablet Take 5 mg by mouth daily.   . Probiotic Product (RISA-BID PROBIOTIC) TABS Take 1 tablet by mouth 2 (two) times daily.  . sennosides-docusate sodium (SENOKOT-S) 8.6-50 MG tablet Take 2 tablets by mouth 3 (three) times daily.   . sertraline (ZOLOFT) 100 MG tablet Take 100 mg by mouth daily.   . Skin Protectants, Misc. (ENDIT EX) Apply liberal amount topically to areas of skin irritation 2 times daily and prn. Ok to leave at bedside.  . sorbitol 70 % solution Take 30 mLs by mouth every 2 (two) hours as needed (constipation).    No facility-administered encounter medications on file as of 01/07/2019.     PHYSICAL EXAM:   General: NAD, frail appearing, thin elderly male Cardiovascular: regular rate and rhythm Pulmonary: clear ant fields Abdomen: soft, nontender, + bowel sounds GU: no suprapubic tenderness Extremities: no edema, no joint deformities Skin: no rashes Neurological: Weakness but otherwise nonfocal/wheelchair dependent   Ihor Gully, NP

## 2019-01-08 ENCOUNTER — Telehealth: Payer: Self-pay | Admitting: Nurse Practitioner

## 2019-01-08 NOTE — Telephone Encounter (Signed)
I called and left message for Ms. [name] [name], Mr. [name] [name] daughter to return call about PC visit

## 2019-01-10 DIAGNOSIS — G903 Multi-system degeneration of the autonomic nervous system: Secondary | ICD-10-CM | POA: Insufficient documentation

## 2019-01-11 ENCOUNTER — Encounter: Payer: Self-pay | Admitting: Urology

## 2019-01-11 ENCOUNTER — Ambulatory Visit (INDEPENDENT_AMBULATORY_CARE_PROVIDER_SITE_OTHER): Payer: Medicare Other | Admitting: Urology

## 2019-01-11 VITALS — BP 135/61 | HR 83 | Ht 67.0 in | Wt 135.0 lb

## 2019-01-11 DIAGNOSIS — R3129 Other microscopic hematuria: Secondary | ICD-10-CM | POA: Diagnosis not present

## 2019-01-11 LAB — URINALYSIS, COMPLETE
Bilirubin, UA: NEGATIVE
GLUCOSE, UA: NEGATIVE
KETONES UA: NEGATIVE
Nitrite, UA: NEGATIVE
SPEC GRAV UA: 1.02 (ref 1.005–1.030)
UUROB: 0.2 mg/dL (ref 0.2–1.0)
pH, UA: 6 (ref 5.0–7.5)

## 2019-01-11 LAB — MICROSCOPIC EXAMINATION
Bacteria, UA: NONE SEEN
Epithelial Cells (non renal): NONE SEEN /hpf (ref 0–10)

## 2019-01-11 NOTE — Progress Notes (Signed)
In and Out Catheterization  Patient is present today for a I & O catheterization. Patient was cleaned and prepped in a sterile fashion with betadine and Lidocaine 2% jelly was instilled into the urethra.  A 14 coude FR cath was inserted no complications were noted , 46ml of urine return was noted, urine was orange in color. A clean urine sample was collected for UA. Bladder was drained  And catheter was removed with out difficulty.    Preformed by: Fonnie Jarvis, CMA

## 2019-01-11 NOTE — Progress Notes (Signed)
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 Cystoscopy was performed with a flexible cystoscope. The urethra, bladder neck and entire bladder was visualized in a standard fashion. The prostate was small and fibrotic consistent with prior radiation.  On retroflexion, small, less than 1 cm area of scarring consistent with prior biopsy and fulguration.  No residual tumor.  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  No recurrence on 35-month cystoscopy today.  With his numerous co-morbidities and frail condition, we agreed to push his next surveillance cystoscopy to 9 months.  If negative at that time, consider yearly cystoscopy.  RTC 9 months for cystoscopy   [name], MD 01/11/2019

## 2019-01-26 ENCOUNTER — Non-Acute Institutional Stay (SKILLED_NURSING_FACILITY): Payer: Medicare Other | Admitting: Adult Health

## 2019-01-26 ENCOUNTER — Encounter: Payer: Self-pay | Admitting: Adult Health

## 2019-01-26 DIAGNOSIS — F330 Major depressive disorder, recurrent, mild: Secondary | ICD-10-CM

## 2019-01-26 NOTE — Progress Notes (Signed)
Location:   [facility] Room Number: [place] of Service:  SNF (31)   CODE STATUS: DNR  Allergies  Allergen Reactions  . Exelon [Rivastigmine Tartrate]     Increased agitation/irritability  . Other     Strawberries  . Penicillins     Chief Complaint  Patient presents with  . Acute Visit    Change in Status    HPI:  Staff reports that he appears to be more anxious. He is less active; his appetite has declined. He states that he has less energy. He denies insomnia. He denies suicidal thoughts. He does have less hope.   Past Medical History:  Diagnosis Date  . Alzheimer's disease ([place]) 11/29/2015  . Anemia, unspecified    pernicious  . Anxiety    unspecified  . Arthritis   . Atrial fibrillation ([place])   . CHF (congestive heart failure) ([place]) 01/2006  . History of kidney stones   . Hyperlipidemia   . Kidney stones   . Nephrolithiasis   . Parkinson's disease ([place])   . Peripheral neuropathy   . Prostate cancer ([place]) 11/2006  . Psoriasis   . Psoriasis, unspecified   . Skin cancer   . Sleep disturbance    REM  . Spinal stenosis    s/p lumbar laminectomy  . Unspecified atrial fibrillation ([place])   . Upper GI bleed    unspecified; history of AVMs  . Valvular heart disease   . Vertigo   . Vertigo     Past Surgical History:  Procedure Laterality Date  . CATARACT EXTRACTION    . CYSTOSCOPY WITH FULGERATION N/A 10/02/2018   Procedure: CYSTOSCOPY WITH FULGERATION;  Surgeon: Billey Co, MD;  Location: [facility] ORS;  Service: Urology;  Laterality: N/A;  . EYE SURGERY    . INTERAL BLEEDING  10/2011   stomach  . MOHS SURGERY     Facial skin   . PROSTATE SURGERY    . SPINE SURGERY     lumbar laminectomy  . TRANSURETHRAL RESECTION OF BLADDER TUMOR WITH MITOMYCIN-C N/A 10/02/2018   Procedure: TRANSURETHRAL RESECTION OF BLADDER TUMOR WITH GEMCITABINE;  Surgeon: Billey Co, MD;  Location: [facility] ORS;  Service: Urology;  Laterality: N/A;     Social History   Socioeconomic History  . Marital status: Widowed    Spouse name: Not on file  . Number of children: 2  . Years of education: College  . 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 Financial resource strain: Not on file  . Food insecurity:    Worry: Not on file    Inability: Not on file  . Transportation needs:    Medical: Not on file    Non-medical: Not on file  Tobacco Use  . Smoking status: Former Smoker    Types: Cigarettes    Last attempt to quit: 03/30/1986    Years since quitting: 32.8  . Smokeless tobacco: Never Used  Substance and Sexual Activity  . Alcohol use: No  . Drug use: No  . Sexual activity: Not on file  Lifestyle  . Physical activity:    Days per week: Not on file    Minutes per session: Not on file  . Stress: Not on file  Relationships  . Social connections:    Talks on phone: Not on file    Gets together: Not on file    Attends religious  service: Not on file    Active member of club or organization: Not on file    Attends meetings of clubs or organizations: Not on file    Relationship status: Not on file  . Intimate partner violence:    Fear of current or ex partner: Not on file    Emotionally abused: Not on file    Physically abused: Not on file    Forced sexual activity: Not on file  Other Topics Concern  . Not on file  Social History Narrative   Admit Date: 01/26/2016 to [facility]   Widowed   2 children   Denies alcohol use   Former smoker   DNR   Family History  Problem Relation Age of Onset  . Hypertension Mother   . Heart disease Father   . Heart disease Brother       VITAL SIGNS BP (!) 124/50   Pulse 70   Temp 98.2 F (36.8 C)   Resp 16   Ht 5\' 8"  (1.727 m)   Wt 136 lb 9.6 oz (62 kg)   SpO2 90%   BMI 20.77 kg/m   Outpatient Encounter Medications as of 01/26/2019  Medication Sig  . acetaminophen (TYLENOL) 325 MG tablet  Take 650 mg by mouth every 4 (four) hours as needed. for pain/ increased temp. May be administered orally, per G-tube if needed or rectally if unable to swallow (separate order). Maximum of 3000 mg Acetaminophen in 24 hours.  Marland Kitchen acetaminophen (TYLENOL) 500 MG tablet Take 500 mg by mouth 2 (two) times daily. Maximum of 3000 mg of acetaminophen in 24 hours. Check prn acetaminophen order prior to administration due to limit.  Marland Kitchen alfuzosin (UROXATRAL) 10 MG 24 hr tablet Take 10 mg by mouth every evening.   Marland Kitchen BUPROPION HCL ER, SR, PO Take 75 mg by mouth daily.   . carbidopa-levodopa (SINEMET CR) 50-200 MG tablet Take 1 tablet by mouth at bedtime.   . carbidopa-levodopa (SINEMET IR) 25-250 MG tablet Take 1 tablet by mouth 3 (three) times daily.   . clonazePAM (KLONOPIN) 0.5 MG tablet Take 1 tablet (0.5 mg total) by mouth daily. For afternoon agitation, restlessness  . clonazePAM (KLONOPIN) 1 MG tablet Take 1 tablet (1 mg total) by mouth at bedtime.  . cyanocobalamin 1000 MCG tablet Take 1,000 mcg by mouth daily.  . hydrocortisone cream 1 % Apply 1 application topically 4 (four) times daily as needed for itching. For Psoriasis  . magnesium hydroxide (MILK OF MAGNESIA) 400 MG/5ML suspension Take 30 mLs by mouth every 4 (four) hours as needed for mild constipation.  . Melatonin 3 MG TABS Take 1 tablet by mouth at bedtime.  . midodrine (PROAMATINE) 2.5 MG tablet Take 2.5 mg by mouth 3 (three) times daily with meals. Take along with sinemet  . morphine (ROXANOL) 20 MG/ML concentrated solution Take 5-10 mg by mouth every hour as needed for severe pain. 0.25 mL for mild to moderate pain and/or dyspnea. 0.50 mL for moderate to severe pain and/or dyspnea.  . NON FORMULARY Diet type:  Regular  . Nutritional Supplements (NUTRITIONAL SUPPLEMENT PO) Magic Cup 2 times daily with lunch and supper trays  . polyethylene glycol (MIRALAX / GLYCOLAX) packet Take 17 g by mouth 2 (two) times daily. Mix with 4-6 ounces of fluid    . predniSONE (DELTASONE) 5 MG tablet Take 5 mg by mouth daily.   . Probiotic Product (RISA-BID PROBIOTIC) TABS Take 1 tablet by mouth 2 (two) times daily.  Marland Kitchen  sennosides-docusate sodium (SENOKOT-S) 8.6-50 MG tablet Take 2 tablets by mouth 3 (three) times daily.   . sertraline (ZOLOFT) 100 MG tablet Take 100 mg by mouth daily.   . Skin Protectants, Misc. (ENDIT EX) Apply liberal amount topically to areas of skin irritation 2 times daily and prn. Ok to leave at bedside.  . sorbitol 70 % solution Take 30 mLs by mouth every 2 (two) hours as needed (constipation).   . triamcinolone cream (KENALOG) 0.1 % Apply liberal amount topically as needed to areas of psoriasis   No facility-administered encounter medications on file as of 01/26/2019.      SIGNIFICANT DIAGNOSTIC EXAMS  PREVIOUS  10-02-18: bladder surgical pathology: UROTHELIAL CARCINOMA  NO NEW EXAMS.    LABS REVIEWED PREVIOUS:   05-21-18: wbc 6.5; hgb 12.3; hct 36.2; mcv 105.6; plt 217; glucose 107; bun 13; creat 0.97; k+ 4.1 na++ 135 ca 8.9; liver normal albumin 4.3 PSA 0.03 07-06-18: urine culture: no growth 08-04-18:wbc 5.2; hgb 11.3; hct 33.1; mcv 100.6; plt 233;  09-01-18: urine culture: no growth 09-21-18: glucose 129; bun 22; creat 1.21; k+ 4.0; na++ 138; ca 8.5  urine culture: staphylococcus aureus 10-14-18: glucose 95; bun 22; creat 0.94; k+ 4.1; na++ 140; ca 8.9 liver normal albumin 3.9   TODAY:   01-06-19: wbc 5.0; hgb 9.9; hct 31.2; mcv 102.3; plt 196  uric acid 4.0     Review of Systems  Reason unable to perform ROS: poor historian   Constitutional: Negative for malaise/fatigue.  Respiratory: Negative for shortness of breath.   Cardiovascular: Negative for chest pain and leg swelling.  Gastrointestinal: Negative for abdominal pain and constipation.  Musculoskeletal: Negative for back pain and joint pain.  Skin: Negative.   Neurological: Negative for dizziness.  Psychiatric/Behavioral: Positive for depression. The patient  is nervous/anxious. The patient does not have insomnia.      Physical Exam Constitutional:      General: He is not in acute distress.    Appearance: He is well-developed. He is not diaphoretic.     Comments: Thin   Neck:     Musculoskeletal: Neck supple.     Thyroid: No thyromegaly.  Cardiovascular:     Rate and Rhythm: Normal rate and regular rhythm.     Pulses: Normal pulses.     Heart sounds: Normal heart sounds.  Pulmonary:     Effort: Pulmonary effort is normal. No respiratory distress.     Breath sounds: Normal breath sounds.  Abdominal:     General: Bowel sounds are normal. There is no distension.     Palpations: Abdomen is soft.     Tenderness: There is no abdominal tenderness.  Musculoskeletal:     Right lower leg: No edema.     Left lower leg: No edema.     Comments: Is able to move all extremities Has bilateral tremors present    Lymphadenopathy:     Cervical: No cervical adenopathy.  Skin:    General: Skin is warm and dry.  Neurological:     Mental Status: He is alert and oriented to person, place, and time. Mental status is at baseline.  Psychiatric:        Mood and Affect: Mood normal.         ASSESSMENT/ PLAN:  TODAY:   1. . Major depression disorder, recurrent: is worse will continue  melatonin 3 mg nightly is taking klonopin 0.5 in the afternoon and 1 mg nightly, wellbutrin 75 mg daily   Will increase  zoloft to 150 mg daily and will monitor    MD is aware of resident's narcotic use and is in agreement with current plan of care. We will attempt to wean resident as apropriate   Ok Edwards NP [facility]  Contact [phone] Monday through Friday 8am- 5pm  After hours call [phone]

## 2019-01-29 ENCOUNTER — Other Ambulatory Visit: Payer: Self-pay | Admitting: Adult Health

## 2019-01-29 MED ORDER — CLONAZEPAM 1 MG PO TABS: 1.0000 mg | ORAL_TABLET | Freq: Every day | ORAL | 0 refills | Status: DC

## 2019-01-29 MED ORDER — CLONAZEPAM 0.5 MG PO TABS: 0.5000 mg | ORAL_TABLET | Freq: Every day | ORAL | 0 refills | Status: DC

## 2019-02-01 ENCOUNTER — Encounter
Admission: RE | Admit: 2019-02-01 | Discharge: 2019-02-01 | Disposition: A | Discharge status: Home / Self Care | Payer: Medicare Other | Source: Ambulatory Visit | Attending: Internal Medicine | Admitting: Internal Medicine

## 2019-02-01 DIAGNOSIS — R319 Hematuria, unspecified: Secondary | ICD-10-CM | POA: Insufficient documentation

## 2019-02-04 ENCOUNTER — Non-Acute Institutional Stay (SKILLED_NURSING_FACILITY): Payer: Medicare Other | Admitting: Adult Health

## 2019-02-04 ENCOUNTER — Encounter: Payer: Self-pay | Admitting: Adult Health

## 2019-02-04 DIAGNOSIS — R600 Localized edema: Secondary | ICD-10-CM

## 2019-02-04 NOTE — Progress Notes (Signed)
[facility]   [facility] Room Number: [place] of Service:  SNF (31)   CODE STATUS: DNR  Allergies  Allergen Reactions  . Exelon [Rivastigmine Tartrate]     Increased agitation/irritability  . Other     Strawberries  . Penicillins     Chief Complaint  Patient presents with  . Acute Visit    edema    HPI:    Past Medical History:  Diagnosis Date  . Alzheimer's disease ([place]) 11/29/2015  . Anemia, unspecified    pernicious  . Anxiety    unspecified  . Arthritis   . Atrial fibrillation ([place])   . CHF (congestive heart failure) ([place]) 01/2006  . History of kidney stones   . Hyperlipidemia   . Kidney stones   . Nephrolithiasis   . Parkinson's disease ([place])   . Peripheral neuropathy   . Prostate cancer ([place]) 11/2006  . Psoriasis   . Psoriasis, unspecified   . Skin cancer   . Sleep disturbance    REM  . Spinal stenosis    s/p lumbar laminectomy  . Unspecified atrial fibrillation ([place])   . Upper GI bleed    unspecified; history of AVMs  . Valvular heart disease   . Vertigo   . Vertigo     Past Surgical History:  Procedure Laterality Date  . CATARACT EXTRACTION    . CYSTOSCOPY WITH FULGERATION N/A 10/02/2018   Procedure: CYSTOSCOPY WITH FULGERATION;  Surgeon: Billey Co, MD;  Location: [facility] ORS;  Service: Urology;  Laterality: N/A;  . EYE SURGERY    . INTERAL BLEEDING  10/2011   stomach  . MOHS SURGERY     Facial skin   . PROSTATE SURGERY    . SPINE SURGERY     lumbar laminectomy  . TRANSURETHRAL RESECTION OF BLADDER TUMOR WITH MITOMYCIN-C N/A 10/02/2018   Procedure: TRANSURETHRAL RESECTION OF BLADDER TUMOR WITH GEMCITABINE;  Surgeon: Billey Co, MD;  Location: [facility] ORS;  Service: Urology;  Laterality: N/A;    Social History   Socioeconomic History  . Marital status: Widowed    Spouse name: Not on file  . Number of children: 2  . Years of education: College  . 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 Financial resource strain: Not on file  . Food insecurity:    Worry: Not on file    Inability: Not on file  . Transportation needs:    Medical: Not on file    Non-medical: Not on file  Tobacco Use  . Smoking status: Former Smoker    Types: Cigarettes    Last attempt to quit: 03/30/1986    Years since quitting: 32.8  . Smokeless tobacco: Never Used  Substance and Sexual Activity  . Alcohol use: No  . Drug use: No  . Sexual activity: Not on file  Lifestyle  . Physical activity:    Days per week: Not on file    Minutes per session: Not on file  . Stress: Not on file  Relationships  . Social connections:    Talks on phone: Not on file    Gets together: Not on file    Attends religious service: Not on file    Active member of club or organization: Not on file    Attends meetings of clubs or organizations: Not on file    Relationship status: Not on file  .  Intimate partner violence:    Fear of current or ex partner: Not on file    Emotionally abused: Not on file    Physically abused: Not on file    Forced sexual activity: Not on file  Other Topics Concern  . Not on file  Social History Narrative   Admit Date: 01/26/2016 to [facility]   Widowed   2 children   Denies alcohol use   Former smoker   DNR   Family History  Problem Relation Age of Onset  . Hypertension Mother   . Heart disease Father   . Heart disease Brother       VITAL SIGNS BP (!) 124/50   Pulse 70   Temp 98.2 F (36.8 C)   Resp 16   Ht 5\' 8"  (1.727 m)   Wt 149 lb 12.8 oz (67.9 kg)   SpO2 90%   BMI 22.78 kg/m   Outpatient Encounter Medications as of 02/04/2019  Medication Sig  . acetaminophen (TYLENOL) 325 MG tablet Take 650 mg by mouth every 4 (four) hours as needed. for pain/ increased temp. May be administered orally, per G-tube if needed or rectally if unable to swallow (separate order). Maximum of 3000 mg  Acetaminophen in 24 hours.  Marland Kitchen acetaminophen (TYLENOL) 500 MG tablet Take 500 mg by mouth 2 (two) times daily. Maximum of 3000 mg of acetaminophen in 24 hours. Check prn acetaminophen order prior to administration due to limit.  Marland Kitchen alfuzosin (UROXATRAL) 10 MG 24 hr tablet Take 10 mg by mouth every evening.   Marland Kitchen BUPROPION HCL ER, SR, PO Take 75 mg by mouth daily.   . carbidopa-levodopa (SINEMET CR) 50-200 MG tablet Take 1 tablet by mouth at bedtime.   . carbidopa-levodopa (SINEMET IR) 25-250 MG tablet Take 1 tablet by mouth 3 (three) times daily.   . clonazePAM (KLONOPIN) 0.5 MG tablet Take 1 tablet (0.5 mg total) by mouth daily. For afternoon agitation, restlessness  . clonazePAM (KLONOPIN) 1 MG tablet Take 1 tablet (1 mg total) by mouth at bedtime.  . cyanocobalamin 1000 MCG tablet Take 1,000 mcg by mouth daily.  . hydrocortisone cream 1 % Apply 1 application topically 4 (four) times daily as needed for itching. For Psoriasis  . magnesium hydroxide (MILK OF MAGNESIA) 400 MG/5ML suspension Take 30 mLs by mouth every 4 (four) hours as needed for mild constipation.  . Melatonin 3 MG TABS Take 1 tablet by mouth at bedtime.  . midodrine (PROAMATINE) 2.5 MG tablet Take 2.5 mg by mouth 3 (three) times daily with meals. Take along with sinemet  . morphine (ROXANOL) 20 MG/ML concentrated solution Take 5-10 mg by mouth every hour as needed for severe pain. 0.25 mL for mild to moderate pain and/or dyspnea. 0.50 mL for moderate to severe pain and/or dyspnea.  . NON FORMULARY Diet type:  Regular  . Nutritional Supplements (NUTRITIONAL SUPPLEMENT PO) Magic Cup 2 times daily with lunch and supper trays  . polyethylene glycol (MIRALAX / GLYCOLAX) packet Take 17 g by mouth 2 (two) times daily. Mix with 4-6 ounces of fluid  . predniSONE (DELTASONE) 5 MG tablet Take 5 mg by mouth daily.   . Probiotic Product (RISA-BID PROBIOTIC) TABS Take 1 tablet by mouth 2 (two) times daily.  . sennosides-docusate sodium  (SENOKOT-S) 8.6-50 MG tablet Take 2 tablets by mouth 3 (three) times daily.   . sertraline (ZOLOFT) 100 MG tablet Take 100 mg by mouth daily.   . Skin Protectants, Misc. (ENDIT EX)  Apply liberal amount topically to areas of skin irritation 2 times daily and prn. Ok to leave at bedside.  . sorbitol 70 % solution Take 30 mLs by mouth every 2 (two) hours as needed (constipation).   . triamcinolone cream (KENALOG) 0.1 % Apply liberal amount topically as needed to areas of psoriasis   No facility-administered encounter medications on file as of 02/04/2019.      SIGNIFICANT DIAGNOSTIC EXAMS  PREVIOUS  10-02-18: bladder surgical pathology: UROTHELIAL CARCINOMA  NO NEW EXAMS.    LABS REVIEWED PREVIOUS:   05-21-18: wbc 6.5; hgb 12.3; hct 36.2; mcv 105.6; plt 217; glucose 107; bun 13; creat 0.97; k+ 4.1 na++ 135 ca 8.9; liver normal albumin 4.3 PSA 0.03 07-06-18: urine culture: no growth 08-04-18:wbc 5.2; hgb 11.3; hct 33.1; mcv 100.6; plt 233;  09-01-18: urine culture: no growth 09-21-18: glucose 129; bun 22; creat 1.21; k+ 4.0; na++ 138; ca 8.5  urine culture: staphylococcus aureus 10-14-18: glucose 95; bun 22; creat 0.94; k+ 4.1; na++ 140; ca 8.9 liver normal albumin 3.9  01-06-19: wbc 5.0; hgb 9.9; hct 31.2; mcv 102.3; plt 196  uric acid 4.0   TODAY;   01-11-19: urinalysis: negative     Review of Systems  Reason unable to perform ROS: poor historian   Constitutional: Negative for malaise/fatigue.  Respiratory: Negative for cough.   Cardiovascular: Positive for leg swelling. Negative for chest pain.  Gastrointestinal: Negative for abdominal pain and heartburn.  Musculoskeletal: Negative for back pain.  Skin: Negative.   Neurological: Negative for dizziness.  Psychiatric/Behavioral: The patient is not nervous/anxious.       Physical Exam Constitutional:      General: He is not in acute distress.    Appearance: He is well-developed. He is not diaphoretic.  Neck:     Musculoskeletal: Neck  supple.     Thyroid: No thyromegaly.  Cardiovascular:     Rate and Rhythm: Normal rate and regular rhythm.     Pulses: Normal pulses.     Heart sounds: Normal heart sounds.  Pulmonary:     Effort: Pulmonary effort is normal. No respiratory distress.     Breath sounds: Normal breath sounds.  Abdominal:     General: Bowel sounds are normal. There is no distension.     Palpations: Abdomen is soft.     Tenderness: There is no abdominal tenderness.  Musculoskeletal:     Right lower leg: Edema present.     Left lower leg: Edema present.     Comments:  Is able to move all extremities Has bilateral tremors present    1+ bilateral lower extremity edema   Lymphadenopathy:     Cervical: No cervical adenopathy.  Skin:    General: Skin is warm and dry.  Neurological:     Mental Status: He is alert and oriented to person, place, and time.  Psychiatric:        Mood and Affect: Mood normal.         ASSESSMENT/ PLAN:  TODAY:   1. Bilateral lower extremity edema; is worse: at this time; will not begin medications; I have encouraged him to elevate his legs; he is wearing ted hose; will make medication changes if this does not improve     MD is aware of resident's narcotic use and is in agreement with current plan of care. We will attempt to wean resident as apropriate   Ok Edwards NP [facility]  Contact [phone] Monday through Friday 8am- 5pm  After  hours call [phone]

## 2019-02-07 DIAGNOSIS — R600 Localized edema: Secondary | ICD-10-CM | POA: Insufficient documentation

## 2019-02-10 ENCOUNTER — Encounter: Payer: Self-pay | Admitting: Adult Health

## 2019-02-10 ENCOUNTER — Non-Acute Institutional Stay (SKILLED_NURSING_FACILITY): Payer: Medicare Other | Admitting: Adult Health

## 2019-02-10 DIAGNOSIS — M48061 Spinal stenosis, lumbar region without neurogenic claudication: Secondary | ICD-10-CM

## 2019-02-10 DIAGNOSIS — D693 Immune thrombocytopenic purpura: Secondary | ICD-10-CM | POA: Diagnosis not present

## 2019-02-10 DIAGNOSIS — I639 Cerebral infarction, unspecified: Secondary | ICD-10-CM | POA: Diagnosis not present

## 2019-02-10 DIAGNOSIS — G20 Parkinson's disease: Secondary | ICD-10-CM

## 2019-02-10 DIAGNOSIS — G20A1 Parkinson's disease without dyskinesia, without mention of fluctuations: Secondary | ICD-10-CM

## 2019-02-10 NOTE — Progress Notes (Signed)
[facility]:   [facility] Room Number: [place] of Service:  SNF (31)   CODE STATUS: DNR  Allergies  Allergen Reactions  . Exelon [Rivastigmine Tartrate]     Increased agitation/irritability  . Other     Strawberries  . Penicillins     Chief Complaint  Patient presents with  . Medical Management of Chronic Issues    parkinson disease; cerebral infarction unspecified mechanism; idiopathic thrombocytopenia purpura; spinal stenosis; lumbar region; without neurogenic claudication.     HPI:  He is a 85 year old long term resident of this facility being seen for the management of his chronic illnesses: parkinson disease; cva; thrombocytopenia; spinal stenosis. He does have a poor appetite; he is on supplements. He denies any uncontrolled pain; no insomnia; no anxiety.   Past Medical History:  Diagnosis Date  . Alzheimer's disease ([place]) 11/29/2015  . Anemia, unspecified    pernicious  . Anxiety    unspecified  . Arthritis   . Atrial fibrillation ([place])   . CHF (congestive heart failure) ([place]) 01/2006  . History of kidney stones   . Hyperlipidemia   . Kidney stones   . Nephrolithiasis   . Parkinson's disease ([place])   . Peripheral neuropathy   . Prostate cancer ([place]) 11/2006  . Psoriasis   . Psoriasis, unspecified   . Skin cancer   . Sleep disturbance    REM  . Spinal stenosis    s/p lumbar laminectomy  . Unspecified atrial fibrillation ([place])   . Upper GI bleed    unspecified; history of AVMs  . Valvular heart disease   . Vertigo   . Vertigo     Past Surgical History:  Procedure Laterality Date  . CATARACT EXTRACTION    . CYSTOSCOPY WITH FULGERATION N/A 10/02/2018   Procedure: CYSTOSCOPY WITH FULGERATION;  Surgeon: Billey Co, MD;  Location: [facility] ORS;  Service: Urology;  Laterality: N/A;  . EYE SURGERY    . INTERAL BLEEDING  10/2011   stomach  . MOHS SURGERY     Facial skin   . PROSTATE SURGERY    . SPINE SURGERY     lumbar  laminectomy  . TRANSURETHRAL RESECTION OF BLADDER TUMOR WITH MITOMYCIN-C N/A 10/02/2018   Procedure: TRANSURETHRAL RESECTION OF BLADDER TUMOR WITH GEMCITABINE;  Surgeon: Billey Co, MD;  Location: [facility] ORS;  Service: Urology;  Laterality: N/A;    Social History   Socioeconomic History  . Marital status: Widowed    Spouse name: Not on file  . Number of children: 2  . Years of education: College  . 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 Financial resource strain: Not on file  . Food insecurity:    Worry: Not on file    Inability: Not on file  . Transportation needs:    Medical: Not on file    Non-medical: Not on file  Tobacco Use  . Smoking status: Former Smoker    Types: Cigarettes    Last attempt to quit: 03/30/1986    Years since quitting: 32.8  . Smokeless tobacco: Never Used  Substance and Sexual Activity  . Alcohol use: No  . Drug use: No  . Sexual activity: Not on file  Lifestyle  . Physical activity:    Days per week: Not on file    Minutes per session: Not on file  . Stress: Not on file  Relationships  . Social connections:    Talks on phone: Not on file    Gets together: Not on file    Attends religious service: Not on file    Active member of club or organization: Not on file    Attends meetings of clubs or organizations: Not on file    Relationship status: Not on file  . Intimate partner violence:    Fear of current or ex partner: Not on file    Emotionally abused: Not on file    Physically abused: Not on file    Forced sexual activity: Not on file  Other Topics Concern  . Not on file  Social History Narrative   Admit Date: 01/26/2016 to [facility]   Widowed   2 children   Denies alcohol use   Former smoker   DNR   Family History  Problem Relation Age of Onset  . Hypertension Mother   . Heart disease Father   . Heart disease Brother       VITAL SIGNS BP (!)  124/50   Pulse 70   Temp 98.2 F (36.8 C)   Resp 16   Ht 5\' 8"  (1.727 m)   Wt 149 lb 12.8 oz (67.9 kg)   SpO2 90%   BMI 22.78 kg/m   Outpatient Encounter Medications as of 02/10/2019  Medication Sig  . acetaminophen (TYLENOL) 325 MG tablet Take 650 mg by mouth every 4 (four) hours as needed. for pain/ increased temp. May be administered orally, per G-tube if needed or rectally if unable to swallow (separate order). Maximum of 3000 mg Acetaminophen in 24 hours.  Marland Kitchen acetaminophen (TYLENOL) 500 MG tablet Take 500 mg by mouth 2 (two) times daily. Maximum of 3000 mg of acetaminophen in 24 hours. Check prn acetaminophen order prior to administration due to limit.  Marland Kitchen alfuzosin (UROXATRAL) 10 MG 24 hr tablet Take 10 mg by mouth every evening.   Marland Kitchen BUPROPION HCL ER, SR, PO Take 75 mg by mouth daily.   . carbidopa-levodopa (SINEMET CR) 50-200 MG tablet Take 1 tablet by mouth at bedtime.   . carbidopa-levodopa (SINEMET IR) 25-250 MG tablet Take 1 tablet by mouth 3 (three) times daily.   . clonazePAM (KLONOPIN) 0.5 MG tablet Take 1 tablet (0.5 mg total) by mouth daily. For afternoon agitation, restlessness  . clonazePAM (KLONOPIN) 1 MG tablet Take 1 tablet (1 mg total) by mouth at bedtime.  . cyanocobalamin 1000 MCG tablet Take 1,000 mcg by mouth daily.  . furosemide (LASIX) 20 MG tablet Take 20 mg by mouth daily as needed.  . hydrocortisone cream 1 % Apply 1 application topically 4 (four) times daily as needed for itching. For Psoriasis  . magnesium hydroxide (MILK OF MAGNESIA) 400 MG/5ML suspension Take 30 mLs by mouth every 4 (four) hours as needed for mild constipation.  . Melatonin 3 MG TABS Take 1 tablet by mouth at bedtime.  . midodrine (PROAMATINE) 2.5 MG tablet Take 2.5 mg by mouth 3 (three) times daily with meals. Take along with sinemet  . morphine (ROXANOL) 20 MG/ML concentrated solution Take 5-10 mg by mouth every hour as needed for severe pain. 0.25 mL for mild to moderate pain and/or  dyspnea. 0.50 mL for moderate to severe pain and/or dyspnea.  . NON FORMULARY Diet type:  Regular  . Nutritional Supplements (NUTRITIONAL SUPPLEMENT PO) Magic Cup 2 times daily with lunch and supper trays  . polyethylene glycol (MIRALAX / GLYCOLAX) packet Take 17 g by  mouth 2 (two) times daily. Mix with 4-6 ounces of fluid  . predniSONE (DELTASONE) 5 MG tablet Take 5 mg by mouth daily.   . Probiotic Product (RISA-BID PROBIOTIC) TABS Take 1 tablet by mouth 2 (two) times daily.  . sennosides-docusate sodium (SENOKOT-S) 8.6-50 MG tablet Take 2 tablets by mouth 3 (three) times daily.   . sertraline (ZOLOFT) 100 MG tablet Take 100 mg by mouth daily.   . Skin Protectants, Misc. (ENDIT EX) Apply liberal amount topically to areas of skin irritation 2 times daily and prn. Ok to leave at bedside.  . sorbitol 70 % solution Take 30 mLs by mouth every 2 (two) hours as needed (constipation).   . triamcinolone cream (KENALOG) 0.1 % Apply liberal amount topically as needed to areas of psoriasis   No facility-administered encounter medications on file as of 02/10/2019.      SIGNIFICANT DIAGNOSTIC EXAMS  PREVIOUS  10-02-18: bladder surgical pathology: UROTHELIAL CARCINOMA  NO NEW EXAMS.    LABS REVIEWED PREVIOUS:   05-21-18: wbc 6.5; hgb 12.3; hct 36.2; mcv 105.6; plt 217; glucose 107; bun 13; creat 0.97; k+ 4.1 na++ 135 ca 8.9; liver normal albumin 4.3 PSA 0.03 07-06-18: urine culture: no growth 08-04-18:wbc 5.2; hgb 11.3; hct 33.1; mcv 100.6; plt 233;  09-01-18: urine culture: no growth 09-21-18: glucose 129; bun 22; creat 1.21; k+ 4.0; na++ 138; ca 8.5  urine culture: staphylococcus aureus 10-14-18: glucose 95; bun 22; creat 0.94; k+ 4.1; na++ 140; ca 8.9 liver normal albumin 3.9  01-06-19: wbc 5.0; hgb 9.9; hct 31.2; mcv 102.3; plt 196  uric acid 4.0  01-11-19: urinalysis: negative   NO NEW LABS.     Review of Systems  Reason unable to perform ROS: poor history   Constitutional: Negative for  malaise/fatigue.  Respiratory: Negative for cough.   Cardiovascular: Negative for chest pain and leg swelling.  Gastrointestinal: Negative for constipation and heartburn.       Has poor appetite   Musculoskeletal: Negative for back pain and myalgias.  Skin: Negative.   Neurological: Negative for dizziness.  Psychiatric/Behavioral: The patient is not nervous/anxious.      Physical Exam Constitutional:      General: He is not in acute distress.    Appearance: He is well-developed. He is not diaphoretic.  Neck:     Musculoskeletal: Neck supple.     Thyroid: No thyromegaly.  Cardiovascular:     Rate and Rhythm: Normal rate and regular rhythm.     Pulses: Normal pulses.     Heart sounds: Normal heart sounds.  Pulmonary:     Effort: Pulmonary effort is normal. No respiratory distress.     Breath sounds: Normal breath sounds.  Abdominal:     General: Bowel sounds are normal. There is no distension.     Palpations: Abdomen is soft.     Tenderness: There is no abdominal tenderness.  Musculoskeletal:     Right lower leg: Edema present.     Left lower leg: Edema present.     Comments:  Is able to move all extremities Has bilateral tremors present    Trace  bilateral lower extremity edema    Lymphadenopathy:     Cervical: No cervical adenopathy.  Skin:    General: Skin is warm and dry.  Neurological:     Mental Status: He is alert and oriented to person, place, and time.  Psychiatric:        Mood and Affect: Mood normal.  ASSESSMENT/ PLAN:  TODAY:   1. Cerebral infarction: is neurologically stable; will monitor   2. Parkinson's disease: is without change; will continue sinemet CR 50/200 mg nightly and IR 25/250 mg three times daily    3.  Idiopathic thrombocytopentia purpura: is without change will continue prednisone 5 mg daily   4. Spinal stenosis lumbar region, without neurogenic claudication/ psoriatic arthritis: is stable will continue tylenol 500 mg twice  daily prednisone 5 mg daily  has roxanol 5 or 10 mg every hours as needed for pain  PREVIOUS  5. 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 Malignant neoplasm of prostate/BPH: is status post transurethral resection of bladder tumor with gencitabine:  PSA normal; will continue uroxatral 10 mg daily is followed by urology   7. Psychosis in elderly with behavioral disturbance: has had hallucinations; hypersexuality; agitation: is stable is presently off seroquel at this time.   8.   Late onset alzheimer's disease with behavioral disturbance: is losing weight; weight is 136 (previous134); pounds; will continue to monitor his status.   9. Major depression disorder, recurrent: is stable will continue  melatonin 3 mg nightly is taking klonopin 0.5 in the afternoon and 1 mg nightly .  Will continue wellbutrin 75 mg daily and zoloft  100 mg daily  10. Atrial fibrillation: heart rate stable; will monitor   11.  Chronic systolic congestive heart failure: is stable will continue to monitor his status.   12. Chronic idiopathic constipation: is stable will continue miralax twice daily and senna s 2 tabs three times daily   13. Orthostatic hypotension: is stable will continue midodrine 2.5 mg three times daily   Will give prevnar 13 vaccine    MD is aware of resident's narcotic use and is in agreement with current plan of care. We will attempt to wean resident as apropriate   Ok Edwards NP [facility]  Contact [phone] Monday through Friday 8am- 5pm  After hours call [phone]

## 2019-02-16 ENCOUNTER — Non-Acute Institutional Stay (SKILLED_NURSING_FACILITY): Payer: Medicare Other | Admitting: Adult Health

## 2019-02-16 ENCOUNTER — Other Ambulatory Visit
Admission: RE | Admit: 2019-02-16 | Discharge: 2019-02-16 | Disposition: A | Discharge status: Home / Self Care | Payer: Medicare Other | Source: Ambulatory Visit | Attending: Internal Medicine | Admitting: Internal Medicine

## 2019-02-16 ENCOUNTER — Encounter: Payer: Self-pay | Admitting: Adult Health

## 2019-02-16 DIAGNOSIS — G20 Parkinson's disease: Secondary | ICD-10-CM

## 2019-02-16 DIAGNOSIS — E876 Hypokalemia: Secondary | ICD-10-CM

## 2019-02-16 DIAGNOSIS — Z8546 Personal history of malignant neoplasm of prostate: Secondary | ICD-10-CM | POA: Insufficient documentation

## 2019-02-16 DIAGNOSIS — G903 Multi-system degeneration of the autonomic nervous system: Secondary | ICD-10-CM

## 2019-02-16 LAB — BASIC METABOLIC PANEL
Anion gap: 3 — ABNORMAL LOW (ref 5–15)
BUN: 13 mg/dL (ref 8–23)
CO2: 31 mmol/L (ref 22–32)
Calcium: 8.4 mg/dL — ABNORMAL LOW (ref 8.9–10.3)
Chloride: 106 mmol/L (ref 98–111)
Creatinine, Ser: 0.99 mg/dL (ref 0.61–1.24)
GFR calc Af Amer: >60 mL/min (ref >60)
GFR calc non Af Amer: >60 mL/min (ref >60)
Glucose, Bld: 85 mg/dL (ref 70–99)
Potassium: 2.6 mmol/L — CL (ref 3.5–5.1)
Sodium: 140 mmol/L (ref 135–145)

## 2019-02-16 NOTE — Progress Notes (Signed)
Location:  [facility] Number: [id number] Place of Service:  SNF (31) Provider:  Durenda Age, NP  Patient Care Team: Kirk Ruths, MD as PCP - General (Internal Medicine) Nyoka Cowden Phylis Bougie, NP as Nurse Practitioner (Geriatric Medicine)  Extended Emergency Contact Information Primary Emergency Contact: [place] Phone: [phone] Relation: Daughter Secondary Emergency Contact: [place], [place] Mobile Phone: [phone] Relation: Son Preferred language: English Interpreter needed? No  Code Status:  DNR  Goals of care: Advanced Directive information Advanced Directives 02/16/2019  Does Patient Have a Medical Advance Directive? Yes  Type of Advance Directive Out of facility DNR (pink MOST or yellow form)  Does patient want to make changes to medical advance directive? No - Patient declined  Copy of [place] in Chart? -  Pre-existing out of facility DNR order (yellow form or pink MOST form) -     Chief Complaint  Patient presents with  . Acute Visit    Patient had a potassium of 2.6.    HPI:  Pt is an 85 y.o. male seen today for an acute visit secondary to a critical potassium value of 2.6. He is a long-term care resident of [facility]  He has a PMH of CVA, spinal stenosis, upper GI bleeding, Parkinson's, and peripheral nerve disease. He was seen in his room today. He denies having weakness. He has not taken any diuretics. He denies diarrhea.   Past Medical History:  Diagnosis Date  . Alzheimer's disease ([place]) 11/29/2015  . Anemia, unspecified    pernicious  . Anxiety    unspecified  . Arthritis   . Atrial fibrillation ([place])   . CHF (congestive heart failure) ([place]) 01/2006  . History of kidney stones   . Hyperlipidemia   . Kidney stones   . Nephrolithiasis   . Parkinson's disease ([place])   . Peripheral neuropathy   . Prostate cancer ([place]) 11/2006  . Psoriasis    . Psoriasis, unspecified   . Skin cancer   . Sleep disturbance    REM  . Spinal stenosis    s/p lumbar laminectomy  . Unspecified atrial fibrillation ([place])   . Upper GI bleed    unspecified; history of AVMs  . Valvular heart disease   . Vertigo   . Vertigo    Past Surgical History:  Procedure Laterality Date  . CATARACT EXTRACTION    . CYSTOSCOPY WITH FULGERATION N/A 10/02/2018   Procedure: CYSTOSCOPY WITH FULGERATION;  Surgeon: Billey Co, MD;  Location: [facility] ORS;  Service: Urology;  Laterality: N/A;  . EYE SURGERY    . INTERAL BLEEDING  10/2011   stomach  . MOHS SURGERY     Facial skin   . PROSTATE SURGERY    . SPINE SURGERY     lumbar laminectomy  . TRANSURETHRAL RESECTION OF BLADDER TUMOR WITH MITOMYCIN-C N/A 10/02/2018   Procedure: TRANSURETHRAL RESECTION OF BLADDER TUMOR WITH GEMCITABINE;  Surgeon: Billey Co, MD;  Location: [facility] ORS;  Service: Urology;  Laterality: N/A;    Allergies  Allergen Reactions  . Exelon [Rivastigmine Tartrate]     Increased agitation/irritability  . Other     Strawberries  . Penicillins     Outpatient Encounter Medications as of 02/16/2019  Medication Sig  . acetaminophen (TYLENOL) 325 MG tablet Take 650 mg by mouth every 4 (four) hours as needed. for pain/ increased temp. May be administered orally, per G-tube if needed or rectally if  unable to swallow (separate order). Maximum of 3000 mg Acetaminophen in 24 hours.  Marland Kitchen acetaminophen (TYLENOL) 500 MG tablet Take 500 mg by mouth 2 (two) times daily. Maximum of 3000 mg of acetaminophen in 24 hours. Check prn acetaminophen order prior to administration due to limit.  Marland Kitchen alfuzosin (UROXATRAL) 10 MG 24 hr tablet Take 10 mg by mouth every evening.   Marland Kitchen BUPROPION HCL ER, SR, PO Take 75 mg by mouth daily.   . carbidopa-levodopa (SINEMET CR) 50-200 MG tablet Take 1 tablet by mouth at bedtime.   . carbidopa-levodopa (SINEMET IR) 25-250 MG tablet Take 1 tablet by mouth 3 (three) times daily.    . clonazePAM (KLONOPIN) 0.5 MG tablet Take 1 tablet (0.5 mg total) by mouth daily. For afternoon agitation, restlessness  . clonazePAM (KLONOPIN) 1 MG tablet Take 1 tablet (1 mg total) by mouth at bedtime.  . cyanocobalamin 1000 MCG tablet Take 1,000 mcg by mouth daily.  . furosemide (LASIX) 20 MG tablet Take 20 mg by mouth daily as needed.  . hydrocortisone cream 1 % Apply 1 application topically 4 (four) times daily as needed for itching. For Psoriasis  . magnesium hydroxide (MILK OF MAGNESIA) 400 MG/5ML suspension Take 30 mLs by mouth every 4 (four) hours as needed for mild constipation.  . Melatonin 3 MG TABS Take 1 tablet by mouth at bedtime.  . midodrine (PROAMATINE) 2.5 MG tablet Take 2.5 mg by mouth 3 (three) times daily with meals. Take along with sinemet  . morphine (ROXANOL) 20 MG/ML concentrated solution Take 5-10 mg by mouth every hour as needed for severe pain. 0.25 mL for mild to moderate pain and/or dyspnea. 0.50 mL for moderate to severe pain and/or dyspnea.  . NON FORMULARY Diet type:  Regular  . Nutritional Supplements (NUTRITIONAL SUPPLEMENT PO) Take 1 each by mouth daily. Magic Cup  . polyethylene glycol (MIRALAX / GLYCOLAX) packet Take 17 g by mouth 2 (two) times daily. Mix with 4-6 ounces of fluid  . predniSONE (DELTASONE) 5 MG tablet Take 5 mg by mouth daily.   . Probiotic Product (RISA-BID PROBIOTIC) TABS Take 1 tablet by mouth 2 (two) times daily.  . sennosides-docusate sodium (SENOKOT-S) 8.6-50 MG tablet Take 2 tablets by mouth 3 (three) times daily.   . sertraline (ZOLOFT) 100 MG tablet Take 150 mg by mouth daily. Give 1-1/2 tablets to = 150 mg  . Skin Protectants, Misc. (ENDIT EX) Apply liberal amount topically to areas of skin irritation 2 times daily and prn. Ok to leave at bedside.  . sorbitol 70 % solution Take 30 mLs by mouth every 2 (two) hours as needed (constipation).   . triamcinolone cream (KENALOG) 0.1 % Apply liberal amount topically as needed to areas  of psoriasis    No facility-administered encounter medications on file as of 02/16/2019.     Review of Systems  GENERAL: No change in appetite, no fatigue, no weight changes, no fever, chills or weakness MOUTH and THROAT: Denies oral discomfort, gingival pain or bleeding RESPIRATORY: no cough, SOB, DOE, wheezing, hemoptysis CARDIAC: No chest pain, edema or palpitations GI: No abdominal pain, diarrhea, constipation, heart burn, nausea or vomiting NEUROLOGICAL: Denies dizziness, syncope, numbness, or headache PSYCHIATRIC: Denies feelings of depression or anxiety. No report of hallucinations, insomnia, paranoia, or agitation   Immunization History  Administered Date(s) Administered  . Influenza, High Dose Seasonal PF 09/22/2017  . Influenza-Unspecified 10/09/2012, 09/28/2016, 10/05/2018  . PPD Test 10/12/2016, 09/29/2017, 09/29/2018  . Pneumococcal-Unspecified 05/13/2014   Pertinent  Health Maintenance Due  Topic Date Due  . PNA vac Low Risk Adult (2 of 2 - PCV13) 03/11/2019 (Originally 05/14/2015)  . INFLUENZA VACCINE  Completed    Vitals:   02/16/19 1128 02/16/19 1259  BP: (!) 115/52 (!) 124/50  Pulse: (!) 117 70  Resp: 20   Temp: 97.8 F (36.6 C)   TempSrc: Oral   SpO2: 95%   Weight: 149 lb 12.8 oz (67.9 kg)   Height: 5\' 8"  (1.727 m)    Body mass index is 22.78 kg/m.  Physical Exam  GENERAL APPEARANCE: Well nourished. In no acute distress. Normal body habitus SKIN:  Skin is warm and dry.  MOUTH and THROAT: Lips are without lesions. Oral mucosa is moist and without lesions. Tongue is normal in shape, size, and color and without lesions RESPIRATORY: Breathing is even & unlabored, BS CTAB CARDIAC: RRR, no murmur,no extra heart sounds, no edema GI: Abdomen soft, normal BS, no masses, no tenderness EXTREMITIES:  Able to move X 4 extremities NEUROLOGICAL:  Speech is clear.Alert and oriented X 3.   PSYCHIATRIC: Affect and behavior are appropriate   Labs  reviewed: Recent Labs    09/21/18 1400 10/14/18 0600 02/16/19 0400  NA 138 140 140  K 4.0 4.1 2.6*  CL 105 110 106  CO2 23 22 31   GLUCOSE 129* 95 85  BUN 22 22 13   CREATININE 1.21 0.94 0.99  CALCIUM 8.5* 8.9 8.4*   Recent Labs    05/21/18 1310 10/14/18 0600  AST 20 19  ALT <5* 7  ALKPHOS 79 88  BILITOT 0.6 0.4  PROT 6.8 6.4*  ALBUMIN 4.3 3.9   Recent Labs    05/21/18 1310 07/27/18 0600 08/04/18 0600 01/06/19 0630  WBC 6.5 4.7 5.2 5.0  NEUTROABS 5.1  --  2.9 2.9  HGB 12.3* 10.7* 11.3* 9.9*  HCT 36.2* 31.1* 33.1* 31.2*  MCV 105.6* 102.4* 100.6* 102.3*  PLT 217 215 233 196   Lab Results  Component Value Date   TSH 3.787 12/09/2017    Lab Results  Component Value Date   CHOL 136 11/29/2015   HDL 44 11/29/2015   LDLCALC 80 11/29/2015   TRIG 59 11/29/2015   CHOLHDL 3.1 11/29/2015    Assessment/Plan  1. Hypokalemia - K 2.6, give KCL ER 20 meq 2 tabs =40 meq PO @ 10AM and 2 PM then discontinue, then repeat BMP  2. Orthostatic hypotension due to Parkinson's disease (HCC) - BP 115/52, continue midodrine 2.5 mg 1 tab 3 times a day  3. Parkinson's disease ([place]) -Stable, continue Sinemet CR 50-200 mg 1 tab at bedtime and 3 times a day   Family/ staff Communication: Discussed plan of care with resident.  Labs/tests ordered:  BMP on 02/17/19  Goals of care:   Long-term care.   Durenda Age, NP [facility] [phone] (Monday-Friday 8:00 a.m. - 5:00 p.m.) [phone] (after hours)

## 2019-02-17 ENCOUNTER — Other Ambulatory Visit
Admission: RE | Admit: 2019-02-17 | Discharge: 2019-02-17 | Disposition: A | Discharge status: Home / Self Care | Payer: Medicare Other | Source: Skilled Nursing Facility | Attending: Internal Medicine | Admitting: Internal Medicine

## 2019-02-17 DIAGNOSIS — I482 Chronic atrial fibrillation, unspecified: Secondary | ICD-10-CM | POA: Insufficient documentation

## 2019-02-17 LAB — BASIC METABOLIC PANEL
Anion gap: 6 (ref 5–15)
BUN: 14 mg/dL (ref 8–23)
CALCIUM: 8.4 mg/dL — AB (ref 8.9–10.3)
CO2: 27 mmol/L (ref 22–32)
Chloride: 108 mmol/L (ref 98–111)
Creatinine, Ser: 0.9 mg/dL (ref 0.61–1.24)
GFR calc Af Amer: >60 mL/min (ref >60)
GFR calc non Af Amer: >60 mL/min (ref >60)
Glucose, Bld: 83 mg/dL (ref 70–99)
Potassium: 3 mmol/L — ABNORMAL LOW (ref 3.5–5.1)
Sodium: 141 mmol/L (ref 135–145)

## 2019-02-24 ENCOUNTER — Other Ambulatory Visit: Payer: Self-pay | Admitting: Adult Health

## 2019-02-24 NOTE — Telephone Encounter (Signed)
Refill request pended and forwarded to Durenda Age, NP for approval and transmittal to [facility].

## 2019-02-25 MED ORDER — CLONAZEPAM 0.5 MG PO TABS: 0.5000 mg | ORAL_TABLET | Freq: Every day | ORAL | 0 refills | Status: DC

## 2019-03-02 ENCOUNTER — Other Ambulatory Visit
Admission: RE | Admit: 2019-03-02 | Discharge: 2019-03-02 | Disposition: A | Discharge status: Home / Self Care | Payer: Medicare Other | Source: Ambulatory Visit | Attending: Adult Health | Admitting: Adult Health

## 2019-03-02 DIAGNOSIS — E876 Hypokalemia: Secondary | ICD-10-CM | POA: Diagnosis present

## 2019-03-02 LAB — BASIC METABOLIC PANEL
Anion gap: 8 (ref 5–15)
BUN: 16 mg/dL (ref 8–23)
CO2: 24 mmol/L (ref 22–32)
Calcium: 8.5 mg/dL — ABNORMAL LOW (ref 8.9–10.3)
Chloride: 108 mmol/L (ref 98–111)
Creatinine, Ser: 0.83 mg/dL (ref 0.61–1.24)
GFR calc Af Amer: >60 mL/min (ref >60)
GFR calc non Af Amer: >60 mL/min (ref >60)
GLUCOSE: 80 mg/dL (ref 70–99)
Potassium: 3.3 mmol/L — ABNORMAL LOW (ref 3.5–5.1)
Sodium: 140 mmol/L (ref 135–145)

## 2019-03-03 ENCOUNTER — Encounter
Admission: RE | Admit: 2019-03-03 | Discharge: 2019-03-03 | Disposition: A | Discharge status: Home / Self Care | Payer: Medicare Other | Source: Ambulatory Visit | Attending: Internal Medicine | Admitting: Internal Medicine

## 2019-03-03 DIAGNOSIS — R319 Hematuria, unspecified: Secondary | ICD-10-CM | POA: Insufficient documentation

## 2019-03-04 ENCOUNTER — Non-Acute Institutional Stay (SKILLED_NURSING_FACILITY): Payer: Medicare Other | Admitting: Adult Health

## 2019-03-04 ENCOUNTER — Encounter: Payer: Self-pay | Admitting: Adult Health

## 2019-03-04 DIAGNOSIS — F330 Major depressive disorder, recurrent, mild: Secondary | ICD-10-CM

## 2019-03-04 DIAGNOSIS — I951 Orthostatic hypotension: Secondary | ICD-10-CM | POA: Diagnosis not present

## 2019-03-04 DIAGNOSIS — C61 Malignant neoplasm of prostate: Secondary | ICD-10-CM | POA: Diagnosis not present

## 2019-03-04 DIAGNOSIS — F419 Anxiety disorder, unspecified: Secondary | ICD-10-CM

## 2019-03-04 DIAGNOSIS — G20 Parkinson's disease: Secondary | ICD-10-CM | POA: Diagnosis not present

## 2019-03-04 DIAGNOSIS — D693 Immune thrombocytopenic purpura: Secondary | ICD-10-CM

## 2019-03-04 DIAGNOSIS — G4709 Other insomnia: Secondary | ICD-10-CM

## 2019-03-04 MED ORDER — CLONAZEPAM 1 MG PO TABS: 1.0000 mg | ORAL_TABLET | Freq: Every day | ORAL | 0 refills | Status: DC

## 2019-03-04 MED ORDER — CLONAZEPAM 0.5 MG PO TABS: 0.5000 mg | ORAL_TABLET | Freq: Every day | ORAL | 0 refills | Status: DC

## 2019-03-04 NOTE — Progress Notes (Signed)
Location:  [facility] Number: [id number] Place of Service:  SNF (31) Provider:  Durenda Age, NP  Patient Care Team: Kirk Ruths, MD as PCP - General (Internal Medicine) Nyoka Cowden Phylis Bougie, NP as Nurse Practitioner (Geriatric Medicine)  Extended Emergency Contact Information Primary Emergency Contact: [place] Phone: [phone] Relation: Daughter Secondary Emergency Contact: [place], [place] Mobile Phone: [phone] Relation: Son Preferred language: English Interpreter needed? No  Code Status:  DNR  Goals of care: Advanced Directive information Advanced Directives 03/04/2019  Does Patient Have a Medical Advance Directive? Yes  Type of Advance Directive Out of facility DNR (pink MOST or yellow form)  Does patient want to make changes to medical advance directive? No - Patient declined  Copy of [place] in Chart? -  Pre-existing out of facility DNR order (yellow form or pink MOST form) -     Chief Complaint  Patient presents with  . Medical Management of Chronic Issues    Routine TVAB SNF visit    HPI:  Pt is an 85 y.o. male seen today for medical management of chronic diseases, as well as medication management to assess his continued use for clonazepam. He has a PMH of CVA, spinal stenosis, upper GI bleeding, Parkinson's, and peripheral neuropathy. He has been supplemented with KCL ER twice and latest K 3.3. His K will be re-checked in a week. He denies any pain when seen today. He was anxious about his REMS diagnosis He takes Clonazepam for anxiety.    Past Medical History:  Diagnosis Date  . Alzheimer's disease ([place]) 11/29/2015  . Anemia, unspecified    pernicious  . Anxiety    unspecified  . Arthritis   . Atrial fibrillation ([place])   . CHF (congestive heart failure) ([place]) 01/2006  . History of kidney stones   . Hyperlipidemia   . Kidney stones   .  Nephrolithiasis   . Parkinson's disease ([place])   . Peripheral neuropathy   . Prostate cancer ([place]) 11/2006  . Psoriasis   . Psoriasis, unspecified   . Skin cancer   . Sleep disturbance    REM  . Spinal stenosis    s/p lumbar laminectomy  . Unspecified atrial fibrillation ([place])   . Upper GI bleed    unspecified; history of AVMs  . Valvular heart disease   . Vertigo   . Vertigo    Past Surgical History:  Procedure Laterality Date  . CATARACT EXTRACTION    . CYSTOSCOPY WITH FULGERATION N/A 10/02/2018   Procedure: CYSTOSCOPY WITH FULGERATION;  Surgeon: Billey Co, MD;  Location: [facility] ORS;  Service: Urology;  Laterality: N/A;  . EYE SURGERY    . INTERAL BLEEDING  10/2011   stomach  . MOHS SURGERY     Facial skin   . PROSTATE SURGERY    . SPINE SURGERY     lumbar laminectomy  . TRANSURETHRAL RESECTION OF BLADDER TUMOR WITH MITOMYCIN-C N/A 10/02/2018   Procedure: TRANSURETHRAL RESECTION OF BLADDER TUMOR WITH GEMCITABINE;  Surgeon: Billey Co, MD;  Location: [facility] ORS;  Service: Urology;  Laterality: N/A;    Allergies  Allergen Reactions  . Exelon [Rivastigmine Tartrate]     Increased agitation/irritability  . Other     Strawberries  . Penicillins     Outpatient Encounter Medications as of 03/04/2019  Medication Sig  . acetaminophen (TYLENOL) 325 MG tablet Take 650 mg by mouth every 4 (four) hours as  needed. for pain/ increased temp. May be administered orally, per G-tube if needed or rectally if unable to swallow (separate order). Maximum of 3000 mg Acetaminophen in 24 hours.  Marland Kitchen acetaminophen (TYLENOL) 500 MG tablet Take 500 mg by mouth 2 (two) times daily. Maximum of 3000 mg of acetaminophen in 24 hours. Check prn acetaminophen order prior to administration due to limit.  Marland Kitchen alfuzosin (UROXATRAL) 10 MG 24 hr tablet Take 10 mg by mouth every evening.   Marland Kitchen BUPROPION HCL ER, SR, PO Take 75 mg by mouth daily.   . carbidopa-levodopa (SINEMET CR) 50-200 MG tablet Take 1  tablet by mouth at bedtime.   . carbidopa-levodopa (SINEMET IR) 25-250 MG tablet Take 1 tablet by mouth 3 (three) times daily.   . clonazePAM (KLONOPIN) 0.5 MG tablet Take 1 tablet (0.5 mg total) by mouth daily. For afternoon agitation, restlessness  . clonazePAM (KLONOPIN) 1 MG tablet Take 1 tablet (1 mg total) by mouth at bedtime.  . cyanocobalamin 1000 MCG tablet Take 1,000 mcg by mouth daily.  . furosemide (LASIX) 20 MG tablet Take 20 mg by mouth daily as needed (CHF).   . hydrocortisone cream 1 % Apply 1 application topically 4 (four) times daily as needed for itching. For Psoriasis  . magnesium hydroxide (MILK OF MAGNESIA) 400 MG/5ML suspension Take 30 mLs by mouth every 4 (four) hours as needed for mild constipation.  . Melatonin 3 MG TABS Take 1 tablet by mouth at bedtime.  . midodrine (PROAMATINE) 2.5 MG tablet Take 2.5 mg by mouth 3 (three) times daily with meals. Take along with sinemet  . NON FORMULARY Diet type:  Regular  . Nutritional Supplements (NUTRITIONAL SUPPLEMENT PO) Take 1 each by mouth daily. Magic Cup  . polyethylene glycol (MIRALAX / GLYCOLAX) packet Take 17 g by mouth 2 (two) times daily. Mix with 4-6 ounces of fluid  . predniSONE (DELTASONE) 5 MG tablet Take 5 mg by mouth daily.   . Probiotic Product (RISA-BID PROBIOTIC) TABS Take 1 tablet by mouth 2 (two) times daily.  . sennosides-docusate sodium (SENOKOT-S) 8.6-50 MG tablet Take 2 tablets by mouth 3 (three) times daily.   . sertraline (ZOLOFT) 100 MG tablet Take 150 mg by mouth daily. Give 1-1/2 tablets to = 150 mg  . Skin Protectants, Misc. (ENDIT EX) Apply liberal amount topically to areas of skin irritation 2 times daily and prn. Ok to leave at bedside.  . sorbitol 70 % solution Take 30 mLs by mouth every 2 (two) hours as needed (constipation).   . triamcinolone cream (KENALOG) 0.1 % Apply 1 application topically daily as needed. Apply liberal amount topically as needed to areas of psoriasis   . [DISCONTINUED]  morphine (ROXANOL) 20 MG/ML concentrated solution Take 5-10 mg by mouth every hour as needed for severe pain. 0.25 mL for mild to moderate pain and/or dyspnea. 0.50 mL for moderate to severe pain and/or dyspnea.   No facility-administered encounter medications on file as of 03/04/2019.     Review of Systems  GENERAL: No change in appetite, no fatigue, no weight changes, no fever, chills or weakness MOUTH and THROAT: Denies oral discomfort, gingival pain or bleeding RESPIRATORY: no cough, SOB, DOE, wheezing, hemoptysis CARDIAC: No chest pain, edema or palpitations GI: No abdominal pain, diarrhea, constipation, heart burn, nausea or vomiting GU: Denies dysuria, frequency, hematuria, incontinence, or discharge NEUROLOGICAL: Denies dizziness, syncope, numbness, or headache PSYCHIATRIC:+ anxiety   Immunization History  Administered Date(s) Administered  . Influenza, High Dose Seasonal PF  09/22/2017  . Influenza-Unspecified 10/09/2012, 09/28/2016, 10/05/2018  . PPD Test 10/12/2016, 09/29/2017, 09/29/2018  . Pneumococcal-Unspecified 05/13/2014   Pertinent  Health Maintenance Due  Topic Date Due  . PNA vac Low Risk Adult (2 of 2 - PCV13) 03/11/2019 (Originally 05/14/2015)  . INFLUENZA VACCINE  Completed    Vitals:   03/04/19 0830  BP: 120/65  Pulse: 70  Resp: 18  Temp: 98 F (36.7 C)  TempSrc: Oral  SpO2: 99%  Weight: 145 lb 11.2 oz (66.1 kg)  Height: 5\' 8"  (1.727 m)   Body mass index is 22.15 kg/m.  Physical Exam  GENERAL APPEARANCE: Well nourished. In no acute distress. Normal body habitus SKIN:  Skin is warm and dry. MOUTH and THROAT: Lips are without lesions. Oral mucosa is moist and without lesions. Tongue is normal in shape, size, and color and without lesions RESPIRATORY: Breathing is even & unlabored, BS CTAB CARDIAC: RRR, no murmur,no extra heart sounds, no edema GI: Abdomen soft, normal BS, no masses, no tenderness EXTREMITIES:  Able to move X 4  extremities NEUROLOGICAL: There is no tremor. Speech is clear. Alert and oriented X 3. PSYCHIATRIC:  Affect and behavior are appropriate   Labs reviewed: Recent Labs    02/16/19 0400 02/17/19 0700 03/02/19 0600  NA 140 141 140  K 2.6* 3.0* 3.3*  CL 106 108 108  CO2 31 27 24   GLUCOSE 85 83 80  BUN 13 14 16   CREATININE 0.99 0.90 0.83  CALCIUM 8.4* 8.4* 8.5*   Recent Labs    05/21/18 1310 10/14/18 0600  AST 20 19  ALT <5* 7  ALKPHOS 79 88  BILITOT 0.6 0.4  PROT 6.8 6.4*  ALBUMIN 4.3 3.9   Recent Labs    05/21/18 1310 07/27/18 0600 08/04/18 0600 01/06/19 0630  WBC 6.5 4.7 5.2 5.0  NEUTROABS 5.1  --  2.9 2.9  HGB 12.3* 10.7* 11.3* 9.9*  HCT 36.2* 31.1* 33.1* 31.2*  MCV 105.6* 102.4* 100.6* 102.3*  PLT 217 215 233 196   Lab Results  Component Value Date   TSH 3.787 12/09/2017    Lab Results  Component Value Date   CHOL 136 11/29/2015   HDL 44 11/29/2015   LDLCALC 80 11/29/2015   TRIG 59 11/29/2015   CHOLHDL 3.1 11/29/2015    Assessment/Plan  1. Parkinson's disease ([place]) -Stable, continue Sinemet CR 50-200 mg 1 tab at bedtime and Sinemet 25-250 mg 1 tab 3 times a day  2. Idiopathic thrombocytopenia purpura (HCC) Lab Results  Component Value Date   PLT 196 01/06/2019  -Continue prednisone 5 mg 1 tab daily  3. Malignant neoplasm of prostate (HCC) -Continue alfuzosin ER 10 mg daily  4. Orthostatic hypotension -Continue midodrine 2.5 mg 1 tab 3 times a day  5. Mild episode of recurrent major depressive disorder (HCC) -Stable, continue bupropion 75 mg 1 tab daily and sertraline 100 mg 1 1/2 tab= 150 mg daily  6. Other insomnia -Continue melatonin 3 mg 1 tab at bedtime  7.  Anxiety - Still needing clonazepam 0.5 mg 1 tab daily 4 PM and 1 mg 1 tab at 8 PM    Family/ staff Communication: Discussed plan of care with resident.  Labs/tests ordered:  None  Goals of care:   Long-term care   Durenda Age, NP [facility]  Adult Medicine [phone] (Monday-Friday 8:00 a.m. - 5:00 p.m.) [phone] (after hours)

## 2019-03-08 ENCOUNTER — Other Ambulatory Visit
Admission: RE | Admit: 2019-03-08 | Discharge: 2019-03-08 | Disposition: A | Discharge status: Home / Self Care | Payer: Medicare Other | Source: Ambulatory Visit | Attending: Adult Health | Admitting: Adult Health

## 2019-03-08 ENCOUNTER — Encounter: Payer: Self-pay | Admitting: Adult Health

## 2019-03-08 ENCOUNTER — Non-Acute Institutional Stay (SKILLED_NURSING_FACILITY): Payer: Medicare Other | Admitting: Adult Health

## 2019-03-08 DIAGNOSIS — G20 Parkinson's disease: Secondary | ICD-10-CM | POA: Diagnosis not present

## 2019-03-08 DIAGNOSIS — E876 Hypokalemia: Secondary | ICD-10-CM | POA: Diagnosis present

## 2019-03-08 DIAGNOSIS — G20A1 Parkinson's disease without dyskinesia, without mention of fluctuations: Secondary | ICD-10-CM

## 2019-03-08 LAB — BASIC METABOLIC PANEL
Anion gap: 10 (ref 5–15)
BUN: 20 mg/dL (ref 8–23)
CALCIUM: 8.6 mg/dL — AB (ref 8.9–10.3)
CO2: 24 mmol/L (ref 22–32)
Chloride: 105 mmol/L (ref 98–111)
Creatinine, Ser: 0.99 mg/dL (ref 0.61–1.24)
GFR calc Af Amer: >60 mL/min (ref >60)
Glucose, Bld: 109 mg/dL — ABNORMAL HIGH (ref 70–99)
Potassium: 3.8 mmol/L (ref 3.5–5.1)
Sodium: 139 mmol/L (ref 135–145)

## 2019-03-08 NOTE — Progress Notes (Signed)
[facility] Number: [id number] Place of Service:  SNF (31) Provider:  Durenda Age, NP  Patient Care Team: Kirk Ruths, MD as PCP - General (Internal Medicine) Nyoka Cowden Phylis Bougie, NP as Nurse Practitioner (Geriatric Medicine)  Extended Emergency Contact Information Primary Emergency Contact: Keene of [place] Phone: [phone] Relation: Daughter Secondary Emergency Contact: Azul, [place] Mobile Phone: [phone] Relation: Son Preferred language: English Interpreter needed? No  Code Status:  DNR  Goals of care: Advanced Directive information Advanced Directives 03/08/2019  Does Patient Have a Medical Advance Directive? Yes  Type of Advance Directive Out of facility DNR (pink MOST or yellow form)  Does patient want to make changes to medical advance directive? No - Patient declined  Copy of [place] in Chart? -  Pre-existing out of facility DNR order (yellow form or pink MOST form) -     Chief Complaint  Patient presents with  . Acute Visit    Patient stating he doesn't feel himself, but doesn't specify any particular complaints.    HPI:  Pt is an 85 y.o. male seen today for acute visit due to voicing complaints that he doesn't feel himself, though voiced no specific complaints. He was recently supplemented with KCL and latest K 3.3, low. He is a long-term care resident of [facility]. He has a PMH of Parkinson's, CVA, spinal stenosis, upper GI bleed, and peripheral neuropathy.    Past Medical History:  Diagnosis Date  . Alzheimer's disease ([place]) 11/29/2015  . Anemia, unspecified    pernicious  . Anxiety    unspecified  . Arthritis   . Atrial fibrillation ([place])   . CHF (congestive heart failure) ([place]) 01/2006  . History of kidney stones   . Hyperlipidemia   . Nephrolithiasis   . Parkinson's disease ([place])   . Peripheral neuropathy   . Prostate cancer ([place]) 11/2006    . Psoriasis   . Skin cancer   . Sleep disturbance    REM  . Spinal stenosis    s/p lumbar laminectomy  . Upper GI bleed    unspecified; history of AVMs  . Valvular heart disease   . Vertigo    Past Surgical History:  Procedure Laterality Date  . CATARACT EXTRACTION    . CYSTOSCOPY WITH FULGERATION N/A 10/02/2018   Procedure: CYSTOSCOPY WITH FULGERATION;  Surgeon: Billey Co, MD;  Location: [facility] ORS;  Service: Urology;  Laterality: N/A;  . EYE SURGERY    . INTERAL BLEEDING  10/2011   stomach  . MOHS SURGERY     Facial skin   . PROSTATE SURGERY    . SPINE SURGERY     lumbar laminectomy  . TRANSURETHRAL RESECTION OF BLADDER TUMOR WITH MITOMYCIN-C N/A 10/02/2018   Procedure: TRANSURETHRAL RESECTION OF BLADDER TUMOR WITH GEMCITABINE;  Surgeon: Billey Co, MD;  Location: [facility] ORS;  Service: Urology;  Laterality: N/A;    Allergies  Allergen Reactions  . Exelon [Rivastigmine Tartrate]     Increased agitation/irritability  . Other     Strawberries  . Penicillins     Outpatient Encounter Medications as of 03/08/2019  Medication Sig  . acetaminophen (TYLENOL) 325 MG tablet Take 650 mg by mouth every 4 (four) hours as needed. for pain/ increased temp. May be administered orally, per G-tube if needed or rectally if unable to swallow (separate order). Maximum of 3000 mg Acetaminophen in 24 hours.  Marland Kitchen acetaminophen (TYLENOL) 500 MG tablet  Take 500 mg by mouth 2 (two) times daily. Maximum of 3000 mg of acetaminophen in 24 hours. Check prn acetaminophen order prior to administration due to limit.  Marland Kitchen alfuzosin (UROXATRAL) 10 MG 24 hr tablet Take 10 mg by mouth every evening.   Marland Kitchen BUPROPION HCL ER, SR, PO Take 75 mg by mouth daily.   . carbidopa-levodopa (SINEMET CR) 50-200 MG tablet Take 1 tablet by mouth at bedtime.   . carbidopa-levodopa (SINEMET IR) 25-250 MG tablet Take 1 tablet by mouth 3 (three) times daily.   . clonazePAM (KLONOPIN) 0.5 MG tablet Take 1 tablet (0.5 mg total)  by mouth daily. For afternoon agitation, restlessness  . clonazePAM (KLONOPIN) 1 MG tablet Take 1 tablet (1 mg total) by mouth at bedtime.  . cyanocobalamin 1000 MCG tablet Take 1,000 mcg by mouth daily.  . furosemide (LASIX) 20 MG tablet Take 20 mg by mouth daily as needed (CHF).   . hydrocortisone cream 1 % Apply 1 application topically 4 (four) times daily as needed for itching. For Psoriasis  . magnesium hydroxide (MILK OF MAGNESIA) 400 MG/5ML suspension Take 30 mLs by mouth every 4 (four) hours as needed for mild constipation.  . Melatonin 3 MG TABS Take 1 tablet by mouth at bedtime.  . midodrine (PROAMATINE) 2.5 MG tablet Take 2.5 mg by mouth 3 (three) times daily with meals. Take along with sinemet  . NON FORMULARY Diet type:  Regular  . Nutritional Supplements (NUTRITIONAL SUPPLEMENT PO) Take 1 each by mouth daily. Magic Cup  . polyethylene glycol (MIRALAX / GLYCOLAX) packet Take 17 g by mouth 2 (two) times daily. Mix with 4-6 ounces of fluid  . predniSONE (DELTASONE) 5 MG tablet Take 5 mg by mouth daily.   . Probiotic Product (RISA-BID PROBIOTIC) TABS Take 1 tablet by mouth 2 (two) times daily.  . sennosides-docusate sodium (SENOKOT-S) 8.6-50 MG tablet Take 2 tablets by mouth 3 (three) times daily.   . sertraline (ZOLOFT) 100 MG tablet Take 150 mg by mouth daily. Give 1-1/2 tablets to = 150 mg  . Skin Protectants, Misc. (ENDIT EX) Apply liberal amount topically to areas of skin irritation 2 times daily and prn. Ok to leave at bedside.  . sorbitol 70 % solution Take 30 mLs by mouth every 2 (two) hours as needed (constipation).   . triamcinolone cream (KENALOG) 0.1 % Apply 1 application topically daily as needed. Apply liberal amount topically as needed to areas of psoriasis    No facility-administered encounter medications on file as of 03/08/2019.     Review of Systems  GENERAL: No change in appetite, no fatigue, no weight changes, no fever, chills or weakness MOUTH and THROAT: Denies  oral discomfort, gingival pain or bleedingRESPIRATORY: no cough, SOB, DOE, wheezing, hemoptysis CARDIAC: No chest pain, edema or palpitations GI: No abdominal pain, diarrhea, constipation, heart burn, nausea or vomiting GU: Denies dysuria, frequency, hematuria, incontinence, or discharge NEUROLOGICAL: Denies dizziness, syncope, numbness, or headache PSYCHIATRIC: Denies feelings of depression or anxiety. No report of hallucinations, insomnia, paranoia, or agitation   Immunization History  Administered Date(s) Administered  . Influenza, High Dose Seasonal PF 09/22/2017  . Influenza-Unspecified 10/09/2012, 09/28/2016, 10/05/2018  . PPD Test 10/12/2016, 09/29/2017, 09/29/2018  . Pneumococcal Conjugate-13 02/12/2019  . Pneumococcal-Unspecified 05/13/2014   Pertinent  Health Maintenance Due  Topic Date Due  . INFLUENZA VACCINE  Completed  . PNA vac Low Risk Adult  Completed    Vitals:   03/08/19 1454  BP: 120/65  Pulse: 75  Resp: 18  Temp: 98 F (36.7 C)  TempSrc: Oral  SpO2: 99%  Weight: 145 lb 11.2 oz (66.1 kg)  Height: 5\' 8"  (1.727 m)   Body mass index is 22.15 kg/m.  Physical Exam  GENERAL APPEARANCE: Well nourished. In no acute distress. Normal body habitus SKIN:  Skin is warm and dry.  MOUTH and THROAT: Lips are without lesions. Oral mucosa is moist and without lesions. Tongue is normal in shape, size, and color and without lesions RESPIRATORY: Breathing is even & unlabored, BS CTAB CARDIAC: RRR, no murmur,no extra heart sounds, no edema GI: Abdomen soft, normal BS, no masses, no tenderness EXTREMITIES:  Able to move X 4 extremities NEUROLOGICAL: + tremor. Speech is clear. Alert to self, disoriented to time and place. PSYCHIATRIC:  Affect and behavior are appropriate  Labs reviewed: Recent Labs    02/16/19 0400 02/17/19 0700 03/02/19 0600  NA 140 141 140  K 2.6* 3.0* 3.3*  CL 106 108 108  CO2 31 27 24   GLUCOSE 85 83 80  BUN 13 14 16   CREATININE 0.99 0.90  0.83  CALCIUM 8.4* 8.4* 8.5*   Recent Labs    05/21/18 1310 10/14/18 0600  AST 20 19  ALT <5* 7  ALKPHOS 79 88  BILITOT 0.6 0.4  PROT 6.8 6.4*  ALBUMIN 4.3 3.9   Recent Labs    05/21/18 1310 07/27/18 0600 08/04/18 0600 01/06/19 0630  WBC 6.5 4.7 5.2 5.0  NEUTROABS 5.1  --  2.9 2.9  HGB 12.3* 10.7* 11.3* 9.9*  HCT 36.2* 31.1* 33.1* 31.2*  MCV 105.6* 102.4* 100.6* 102.3*  PLT 217 215 233 196   Lab Results  Component Value Date   TSH 3.787 12/09/2017    Lab Results  Component Value Date   CHOL 136 11/29/2015   HDL 44 11/29/2015   LDLCALC 80 11/29/2015   TRIG 59 11/29/2015   CHOLHDL 3.1 11/29/2015    Assessment/Plan  1. Hypokalemia - was recently supplemented, K 3.3 (03/02/19), will re-check BMP   2. Parkinson's disease ([place]) - will continue Sinemet 25-250 mg 1 tab 3 times a day, Sinemet CR 50-200 mg 1 tab at bedtime     Family/ staff Communication: Discussed plan of care with resident.  Labs/tests ordered:  None  Goals of care:   Long-term care.   Durenda Age, NP [facility] and Adult Medicine [phone] (Monday-Friday 8:00 a.m. - 5:00 p.m.) [phone] (after hours)

## 2019-03-19 ENCOUNTER — Other Ambulatory Visit: Payer: Self-pay | Admitting: Adult Health

## 2019-03-19 MED ORDER — CLONAZEPAM 0.5 MG PO TABS: 0.5000 mg | ORAL_TABLET | Freq: Every day | ORAL | 0 refills | Status: DC

## 2019-03-19 MED ORDER — CLONAZEPAM 1 MG PO TABS: 1.0000 mg | ORAL_TABLET | Freq: Every day | ORAL | 0 refills | Status: DC

## 2019-03-19 NOTE — Telephone Encounter (Signed)
Refill request completed, pended, and forwarded to Durenda Age, NP for approval and transmittal to [facility].

## 2019-03-31 ENCOUNTER — Encounter
Admission: RE | Admit: 2019-03-31 | Discharge: 2019-03-31 | Disposition: A | Discharge status: Home / Self Care | Payer: Medicare Other | Source: Ambulatory Visit | Attending: Internal Medicine | Admitting: Internal Medicine

## 2019-03-31 DIAGNOSIS — R319 Hematuria, unspecified: Secondary | ICD-10-CM | POA: Insufficient documentation

## 2019-04-01 ENCOUNTER — Non-Acute Institutional Stay (SKILLED_NURSING_FACILITY): Payer: Medicare Other | Admitting: Adult Health

## 2019-04-01 ENCOUNTER — Encounter: Payer: Self-pay | Admitting: Adult Health

## 2019-04-01 DIAGNOSIS — I951 Orthostatic hypotension: Secondary | ICD-10-CM

## 2019-04-01 DIAGNOSIS — G20 Parkinson's disease: Secondary | ICD-10-CM | POA: Diagnosis not present

## 2019-04-01 DIAGNOSIS — Z8546 Personal history of malignant neoplasm of prostate: Secondary | ICD-10-CM

## 2019-04-01 DIAGNOSIS — G4709 Other insomnia: Secondary | ICD-10-CM

## 2019-04-01 DIAGNOSIS — F419 Anxiety disorder, unspecified: Secondary | ICD-10-CM

## 2019-04-01 DIAGNOSIS — F0281 Dementia in other diseases classified elsewhere with behavioral disturbance: Secondary | ICD-10-CM

## 2019-04-01 DIAGNOSIS — F330 Major depressive disorder, recurrent, mild: Secondary | ICD-10-CM | POA: Diagnosis not present

## 2019-04-01 DIAGNOSIS — G301 Alzheimer's disease with late onset: Secondary | ICD-10-CM

## 2019-04-01 NOTE — Progress Notes (Signed)
[facility] Room Number: [place] of Service:  SNF (31) Provider:  Durenda Age, NP  Patient Care Team: Kirk Ruths, MD as PCP - General (Internal Medicine) Gerlene Fee, NP as Nurse Practitioner (Geriatric Medicine)  Extended Emergency Contact Information Primary Emergency Contact: Bet[place]da of [place] Phone: [phone] Relation: Daughter Secondary Emergency Contact: Armando, [place] Mobile Phone: [phone] Relation: Son Preferred language: English Interpreter needed? No  Code Status:  DNR  Goals of care: Advanced Directive information Advanced Directives 04/01/2019  Does Patient Have a Medical Advance Directive? Yes  Type of Advance Directive Out of facility DNR (pink MOST or yellow form)  Does patient want to make changes to medical advance directive? No - Patient declined  Copy of [place] in Chart? -  Pre-existing out of facility DNR order (yellow form or pink MOST form) Yellow form placed in chart (order not valid for inpatient use)     Chief Complaint  Patient presents with  . Medical Management of Chronic Issues    Routine TVAB SNF visit    HPI:  Pt is a 85 y.o. male seen today for medical management of chronic diseases.  He has PMH of Alzheimer's disease, anemia, parkinson's disease, CHF and upper GI bleed. He was seen today in his room. No noted SOB. He verbalized feeling depressed but has no intention of hurting himself.  He is currently taking Sertraline and Bupropion for depression.    Past Medical History:  Diagnosis Date  . Alzheimer's disease ([place]) 11/29/2015  . Anemia, unspecified    pernicious  . Anxiety    unspecified  . Arthritis   . Atrial fibrillation ([place])   . CHF (congestive heart failure) ([place]) 01/2006  . History of kidney stones   . Hyperlipidemia   . Nephrolithiasis   . Parkinson's disease ([place])   . Peripheral neuropathy   .  Prostate cancer ([place]) 11/2006  . Psoriasis   . Skin cancer   . Sleep disturbance    REM  . Spinal stenosis    s/p lumbar laminectomy  . Upper GI bleed    unspecified; history of AVMs  . Valvular heart disease   . Vertigo    Past Surgical History:  Procedure Laterality Date  . CATARACT EXTRACTION    . CYSTOSCOPY WITH FULGERATION N/A 10/02/2018   Procedure: CYSTOSCOPY WITH FULGERATION;  Surgeon: Billey Co, MD;  Location: [facility] ORS;  Service: Urology;  Laterality: N/A;  . EYE SURGERY    . INTERAL BLEEDING  10/2011   stomach  . MOHS SURGERY     Facial skin   . PROSTATE SURGERY    . SPINE SURGERY     lumbar laminectomy  . TRANSURETHRAL RESECTION OF BLADDER TUMOR WITH MITOMYCIN-C N/A 10/02/2018   Procedure: TRANSURETHRAL RESECTION OF BLADDER TUMOR WITH GEMCITABINE;  Surgeon: Billey Co, MD;  Location: [facility] ORS;  Service: Urology;  Laterality: N/A;    Allergies  Allergen Reactions  . Exelon [Rivastigmine Tartrate]     Increased agitation/irritability  . Other     Strawberries  . Penicillins     Outpatient Encounter Medications as of 04/01/2019  Medication Sig  . acetaminophen (TYLENOL) 325 MG tablet Take 650 mg by mouth every 4 (four) hours as needed. for pain/ increased temp. May be administered orally, per G-tube if needed or rectally if unable to swallow (separate order). Maximum of 3000 mg Acetaminophen in 24 hours.  Marland Kitchen  acetaminophen (TYLENOL) 500 MG tablet Take 500 mg by mouth 2 (two) times daily. Maximum of 3000 mg of acetaminophen in 24 hours. Check prn acetaminophen order prior to administration due to limit.  Marland Kitchen alfuzosin (UROXATRAL) 10 MG 24 hr tablet Take 10 mg by mouth every evening.   Marland Kitchen BUPROPION HCL ER, SR, PO Take 75 mg by mouth daily.   . carbidopa-levodopa (SINEMET CR) 50-200 MG tablet Take 1 tablet by mouth at bedtime.   . carbidopa-levodopa (SINEMET IR) 25-250 MG tablet Take 1 tablet by mouth 3 (three) times daily.   . cyanocobalamin 1000 MCG tablet  Take 1,000 mcg by mouth daily.  . furosemide (LASIX) 20 MG tablet Take 20 mg by mouth daily as needed (CHF).   . hydrocortisone cream 1 % Apply 1 application topically 4 (four) times daily as needed for itching. For Psoriasis  . magnesium hydroxide (MILK OF MAGNESIA) 400 MG/5ML suspension Take 30 mLs by mouth every 4 (four) hours as needed for mild constipation.  . Melatonin 3 MG TABS Take 1 tablet by mouth at bedtime.  . midodrine (PROAMATINE) 2.5 MG tablet Take 2.5 mg by mouth 3 (three) times daily with meals. Take along with sinemet  . NON FORMULARY Diet type:  Regular  . Nutritional Supplements (NUTRITIONAL SUPPLEMENT PO) Take 1 each by mouth daily. Magic Cup  . polyethylene glycol (MIRALAX / GLYCOLAX) packet Take 17 g by mouth 2 (two) times daily. Mix with 4-6 ounces of fluid  . predniSONE (DELTASONE) 5 MG tablet Take 5 mg by mouth daily.   . Probiotic Product (RISA-BID PROBIOTIC) TABS Take 1 tablet by mouth 2 (two) times daily.  . sennosides-docusate sodium (SENOKOT-S) 8.6-50 MG tablet Take 2 tablets by mouth 3 (three) times daily.   . sertraline (ZOLOFT) 100 MG tablet Take 150 mg by mouth daily. Give 1-1/2 tablets to = 150 mg  . Skin Protectants, Misc. (ENDIT EX) Apply liberal amount topically to areas of skin irritation 2 times daily and prn. Ok to leave at bedside.  . sorbitol 70 % solution Take 30 mLs by mouth every 2 (two) hours as needed (constipation).   . triamcinolone cream (KENALOG) 0.1 % Apply 1 application topically daily as needed. Apply liberal amount topically as needed to areas of psoriasis   . [DISCONTINUED] clonazePAM (KLONOPIN) 0.5 MG tablet Take 1 tablet (0.5 mg total) by mouth daily. For afternoon agitation, restlessness  . [DISCONTINUED] clonazePAM (KLONOPIN) 1 MG tablet Take 1 tablet (1 mg total) by mouth at bedtime.   No facility-administered encounter medications on file as of 04/01/2019.     Review of Systems  GENERAL: No change in appetite, no fatigue, no fever,  chills or weakness MOUTH and THROAT: Denies oral discomfort, gingival pain or bleeding, pain from teeth or hoarseness   RESPIRATORY: no cough, SOB, DOE, wheezing, hemoptysis CARDIAC: No chest pain, edema or palpitations GI: No abdominal pain, diarrhea, constipation, heart burn, nausea or vomiting GU: Denies dysuria, frequency, hematuria, or discharge NEUROLOGICAL: Denies dizziness, syncope, numbness, or headache PSYCHIATRIC:+ depression   Immunization History  Administered Date(s) Administered  . Influenza, High Dose Seasonal PF 09/22/2017  . Influenza-Unspecified 10/09/2012, 09/28/2016, 10/05/2018  . PPD Test 10/12/2016, 09/29/2017, 09/29/2018  . Pneumococcal Conjugate-13 02/12/2019  . Pneumococcal-Unspecified 05/13/2014   Pertinent  Health Maintenance Due  Topic Date Due  . INFLUENZA VACCINE  07/31/2019  . PNA vac Low Risk Adult  Completed   No flowsheet data found.   Vitals:   04/01/19 1142  BP: 120/65  Weight: 136 lb 6.4 oz (61.9 kg)  Height: 5\' 8"  (1.727 m)   Body mass index is 20.74 kg/m.  Physical Exam  GENERAL APPEARANCE: Well nourished. In no acute distress. Normal body habitus SKIN:  Skin is warm and dry.  MOUTH and THROAT: Lips are without lesions. Oral mucosa is moist and without lesions. Tongue is normal in shape, size, and color and without lesions RESPIRATORY: Breathing is even & unlabored, BS CTAB CARDIAC: RRR, no murmur,no extra heart sounds, no edema GI: Abdomen soft, normal BS, no masses, no tenderness EXTREMITIES:  Able to move X 4 extremities NEUROLOGICAL:  Speech is clear. Alert and oriented X 3. PSYCHIATRIC:  Affect and behavior are appropriate  Labs reviewed: Recent Labs    02/17/19 0700 03/02/19 0600 03/08/19 1645  NA 141 140 139  K 3.0* 3.3* 3.8  CL 108 108 105  CO2 27 24 24   GLUCOSE 83 80 109*  BUN 14 16 20   CREATININE 0.90 0.83 0.99  CALCIUM 8.4* 8.5* 8.6*   Recent Labs    05/21/18 1310 10/14/18 0600  AST 20 19  ALT <5* 7   ALKPHOS 79 88  BILITOT 0.6 0.4  PROT 6.8 6.4*  ALBUMIN 4.3 3.9   Recent Labs    05/21/18 1310 07/27/18 0600 08/04/18 0600 01/06/19 0630  WBC 6.5 4.7 5.2 5.0  NEUTROABS 5.1  --  2.9 2.9  HGB 12.3* 10.7* 11.3* 9.9*  HCT 36.2* 31.1* 33.1* 31.2*  MCV 105.6* 102.4* 100.6* 102.3*  PLT 217 215 233 196   Lab Results  Component Value Date   TSH 3.787 12/09/2017   No results found for: HGBA1C Lab Results  Component Value Date   CHOL 136 11/29/2015   HDL 44 11/29/2015   LDLCALC 80 11/29/2015   TRIG 59 11/29/2015   CHOLHDL 3.1 11/29/2015     Assessment/Plan  1. History of malignant neoplasm of prostate -Does not report any hematuria, continue Alfuzosin ER 10 mg 1 tablet daily and prednisone 5 mg 1 tab daily  2. Orthostatic hypotension -BPs are stable, continue midodrine 2.5 mg 1 tab 3 times a day  3. Parkinson's disease ([place]) -Continue Sinemet 25-250 mg 1 tab 3 times a day and Sinemet CR 50-200 mg 1 tab at bedtime  4. Mild episode of recurrent major depressive disorder (H[facility]) -Mood is stable, continue sertraline 100 mg 1 1/2 tab = 150 mg daily and bupropion 75 mg 1 tab daily  5. Other insomnia -Continue melatonin 3 mg 1 tab at bedtime  6. Anxiety -Stable, continue clonazepam 0.5 mg 1 tab at 4 PM and 1 mg 1 tab at bedtime  7. Late onset Alzheimer's disease with behavioral disturbance ([place]) -Continue supportive care, fall precautions   Family/ staff Communication: Discussed plan of care with resident.  Labs/tests ordered: None  Goals of care: Long-term care  Durenda Age, NP [facility] and Adult Medicine [phone] (Monday-Friday 8:00 a.m. - 5:00 p.m.) [phone] (after hours)

## 2019-04-08 ENCOUNTER — Other Ambulatory Visit: Payer: Self-pay

## 2019-04-08 MED ORDER — CLONAZEPAM 1 MG PO TABS: 1.0000 mg | ORAL_TABLET | Freq: Every day | ORAL | 0 refills | Status: DC

## 2019-04-08 MED ORDER — CLONAZEPAM 0.5 MG PO TABS: 0.5000 mg | ORAL_TABLET | Freq: Every day | ORAL | 0 refills | Status: DC

## 2019-04-17 ENCOUNTER — Other Ambulatory Visit
Admission: RE | Admit: 2019-04-17 | Discharge: 2019-04-17 | Disposition: A | Discharge status: Home / Self Care | Payer: Medicare Other | Source: Ambulatory Visit | Attending: Internal Medicine | Admitting: Internal Medicine

## 2019-04-17 DIAGNOSIS — R3129 Other microscopic hematuria: Secondary | ICD-10-CM | POA: Diagnosis present

## 2019-04-17 LAB — URINALYSIS, COMPLETE (UACMP) WITH MICROSCOPIC
Bilirubin Urine: NEGATIVE
Glucose, UA: NEGATIVE mg/dL
Ketones, ur: 5 mg/dL — AB
Nitrite: NEGATIVE
Protein, ur: NEGATIVE mg/dL
Specific Gravity, Urine: 1.021 (ref 1.005–1.030)
WBC, UA: >50 WBC/hpf — ABNORMAL HIGH (ref 0–5)
pH: 5 (ref 5.0–8.0)

## 2019-04-20 LAB — URINE CULTURE: Culture: 50000 — AB

## 2019-04-26 ENCOUNTER — Other Ambulatory Visit: Payer: Self-pay | Admitting: Adult Health

## 2019-04-26 MED ORDER — CLONAZEPAM 0.5 MG PO TABS: 0.5000 mg | ORAL_TABLET | Freq: Every day | ORAL | 0 refills | Status: DC

## 2019-04-26 MED ORDER — CLONAZEPAM 1 MG PO TABS: 1.0000 mg | ORAL_TABLET | Freq: Every day | ORAL | 0 refills | Status: DC

## 2019-04-30 ENCOUNTER — Encounter
Admission: RE | Admit: 2019-04-30 | Discharge: 2019-04-30 | Disposition: A | Discharge status: Home / Self Care | Payer: Medicare Other | Source: Ambulatory Visit | Attending: Internal Medicine | Admitting: Internal Medicine

## 2019-04-30 DIAGNOSIS — R319 Hematuria, unspecified: Secondary | ICD-10-CM | POA: Insufficient documentation

## 2019-05-06 ENCOUNTER — Non-Acute Institutional Stay (SKILLED_NURSING_FACILITY): Payer: Medicare Other | Admitting: Adult Health

## 2019-05-06 ENCOUNTER — Encounter: Payer: Self-pay | Admitting: Adult Health

## 2019-05-06 DIAGNOSIS — G903 Multi-system degeneration of the autonomic nervous system: Secondary | ICD-10-CM | POA: Diagnosis not present

## 2019-05-06 DIAGNOSIS — C61 Malignant neoplasm of prostate: Secondary | ICD-10-CM

## 2019-05-06 DIAGNOSIS — F330 Major depressive disorder, recurrent, mild: Secondary | ICD-10-CM | POA: Diagnosis not present

## 2019-05-06 DIAGNOSIS — F419 Anxiety disorder, unspecified: Secondary | ICD-10-CM | POA: Diagnosis not present

## 2019-05-06 DIAGNOSIS — G4709 Other insomnia: Secondary | ICD-10-CM

## 2019-05-06 NOTE — Progress Notes (Signed)
Location:  [facility] Room Number: 332-P Place of Service:  SNF (31) Provider:  Durenda Age, DNP, FNP-BC  Patient Care Team: Kirk Ruths, MD as PCP - General (Internal Medicine) Nyoka Cowden Phylis Bougie, NP as Nurse Practitioner (Geriatric Medicine)  Extended Emergency Contact Information Primary Emergency Contact: [place] Phone: [phone] Relation: Daughter Secondary Emergency Contact: Anthony, [place] Mobile Phone: [phone] Relation: Son Preferred language: English Interpreter needed? No  Code Status:  DNR  Goals of care: Advanced Directive information Advanced Directives 04/01/2019  Does Patient Have a Medical Advance Directive? Yes  Type of Advance Directive Out of facility DNR (pink MOST or yellow form)  Does patient want to make changes to medical advance directive? No - Patient declined  Copy of [place] in Chart? -  Pre-existing out of facility DNR order (yellow form or pink MOST form) Yellow form placed in chart (order not valid for inpatient use)     Chief Complaint  Patient presents with  . Medical Management of Chronic Issues    Routine TVAB visit    HPI:  Pt is an 85 y.o. male seen today for medical management of chronic diseases.  He is a long-term care resident of [facility]. He has a PMH of Parkinson's, CVA, spinal stenosis, upper GI bleed, and peripheral neuropathy. He was seen in the room today. He was seen in the room today. He verbalized being depressed but denies having plans of harming himself. He said that he sleeps lightly at night. BP 110/56, 125/64, 124/84. He continues to take Midodrine for hypotension.   Past Medical History:  Diagnosis Date  . Alzheimer's disease ([place]) 11/29/2015  . Anemia, unspecified    pernicious  . Anxiety    unspecified  . Arthritis   . Atrial fibrillation ([place])   . CHF (congestive heart failure) ([place]) 01/2006  . History  of kidney stones   . Hyperlipidemia   . Nephrolithiasis   . Parkinson's disease ([place])   . Peripheral neuropathy   . Prostate cancer ([place]) 11/2006  . Psoriasis   . Skin cancer   . Sleep disturbance    REM  . Spinal stenosis    s/p lumbar laminectomy  . Upper GI bleed    unspecified; history of AVMs  . Valvular heart disease   . Vertigo    Past Surgical History:  Procedure Laterality Date  . CATARACT EXTRACTION    . CYSTOSCOPY WITH FULGERATION N/A 10/02/2018   Procedure: CYSTOSCOPY WITH FULGERATION;  Surgeon: Billey Co, MD;  Location: [facility] ORS;  Service: Urology;  Laterality: N/A;  . EYE SURGERY    . INTERAL BLEEDING  10/2011   stomach  . MOHS SURGERY     Facial skin   . PROSTATE SURGERY    . SPINE SURGERY     lumbar laminectomy  . TRANSURETHRAL RESECTION OF BLADDER TUMOR WITH MITOMYCIN-C N/A 10/02/2018   Procedure: TRANSURETHRAL RESECTION OF BLADDER TUMOR WITH GEMCITABINE;  Surgeon: Billey Co, MD;  Location: [facility] ORS;  Service: Urology;  Laterality: N/A;    Allergies  Allergen Reactions  . Exelon [Rivastigmine Tartrate]     Increased agitation/irritability  . Other     Strawberries  . Penicillins     Outpatient Encounter Medications as of 05/06/2019  Medication Sig  . acetaminophen (TYLENOL) 325 MG tablet Take 650 mg by mouth every 4 (four) hours as needed. for pain/ increased temp. May be administered orally, per  G-tube if needed or rectally if unable to swallow (separate order). Maximum of 3000 mg Acetaminophen in 24 hours.  Marland Kitchen acetaminophen (TYLENOL) 500 MG tablet Take 500 mg by mouth 2 (two) times daily. Maximum of 3000 mg of acetaminophen in 24 hours. Check prn acetaminophen order prior to administration due to limit.  Marland Kitchen alfuzosin (UROXATRAL) 10 MG 24 hr tablet Take 10 mg by mouth every evening.   Marland Kitchen BUPROPION HCL ER, SR, PO Take 75 mg by mouth daily.   . carbidopa-levodopa (SINEMET CR) 50-200 MG tablet Take 1 tablet by mouth at bedtime.   .  carbidopa-levodopa (SINEMET IR) 25-250 MG tablet Take 1 tablet by mouth 3 (three) times daily.   . clonazePAM (KLONOPIN) 0.5 MG tablet Take 1 tablet (0.5 mg total) by mouth daily. For afternoon agitation, restlessness  . clonazePAM (KLONOPIN) 1 MG tablet Take 1 tablet (1 mg total) by mouth at bedtime.  . cyanocobalamin 1000 MCG tablet Take 1,000 mcg by mouth daily.  . cyclobenzaprine (FLEXERIL) 5 MG tablet Take 5 mg by mouth every 8 (eight) hours as needed (Back pain).  . furosemide (LASIX) 20 MG tablet Take 20 mg by mouth daily as needed (CHF).   . hydrocortisone cream 1 % Apply 1 application topically 4 (four) times daily as needed for itching. For Psoriasis  . magnesium hydroxide (MILK OF MAGNESIA) 400 MG/5ML suspension Take 30 mLs by mouth every 4 (four) hours as needed for mild constipation.  . Melatonin 3 MG TABS Take 1 tablet by mouth at bedtime.  . midodrine (PROAMATINE) 2.5 MG tablet Take 2.5 mg by mouth 3 (three) times daily with meals. Take along with sinemet  . NON FORMULARY Diet type:  Regular  . Nutritional Supplements (NUTRITIONAL SUPPLEMENT PO) Take 1 each by mouth 3 (three) times daily with meals. Magic Cup  . polyethylene glycol (MIRALAX / GLYCOLAX) packet Take 17 g by mouth 2 (two) times daily. Mix with 4-6 ounces of fluid  . predniSONE (DELTASONE) 5 MG tablet Take 5 mg by mouth daily.   . Probiotic Product (RISA-BID PROBIOTIC) TABS Take 1 tablet by mouth 2 (two) times daily.  . sennosides-docusate sodium (SENOKOT-S) 8.6-50 MG tablet Take 2 tablets by mouth 3 (three) times daily.   . sertraline (ZOLOFT) 100 MG tablet Take 150 mg by mouth daily. Give 1-1/2 tablets to = 150 mg  . Skin Protectants, Misc. (ENDIT EX) Apply liberal amount topically to areas of skin irritation 2 times daily and prn. Ok to leave at bedside.  . sorbitol 70 % solution Take 30 mLs by mouth every 2 (two) hours as needed (constipation).   . triamcinolone cream (KENALOG) 0.1 % Apply 1 application topically  daily as needed. Apply liberal amount topically as needed to areas of psoriasis    No facility-administered encounter medications on file as of 05/06/2019.     Review of Systems  GENERAL: No fever or chills  MOUTH and THROAT: Denies oral discomfort, gingival pain or bleeding RESPIRATORY: no cough, SOB, DOE, wheezing, hemoptysis CARDIAC: No chest pain, edema or palpitations GI: No abdominal pain, diarrhea, constipation, heart burn, nausea or vomiting GU: Denies dysuria, frequency, hematuria, incontinence, or discharge NEUROLOGICAL: Denies dizziness, syncope, numbness, or headache PSYCHIATRIC:  +depression  Immunization History  Administered Date(s) Administered  . Influenza, High Dose Seasonal PF 09/22/2017  . Influenza-Unspecified 10/09/2012, 09/28/2016, 10/05/2018  . PPD Test 10/12/2016, 09/29/2017, 09/29/2018  . Pneumococcal Conjugate-13 02/12/2019  . Pneumococcal-Unspecified 05/13/2014   Pertinent  Health Maintenance Due  Topic Date  Due  . INFLUENZA VACCINE  07/31/2019  . PNA vac Low Risk Adult  Completed    Vitals:   05/06/19 1042  BP: (!) 110/56  Pulse: 83  Resp: 16  Temp: (!) 97.4 F (36.3 C)  TempSrc: Oral  SpO2: 97%  Weight: 135 lb 12.8 oz (61.6 kg)  Height: 5\' 8"  (1.727 m)   Body mass index is 20.65 kg/m.  Physical Exam  GENERAL APPEARANCE:  In no acute distress.  SKIN:  Skin is warm and dry.  MOUTH and THROAT: Lips are without lesions. Oral mucosa is moist and without lesions.  RESPIRATORY: Breathing is even & unlabored, BS CTAB CARDIAC: RRR, no murmur,no extra heart sounds, no edema GI: Abdomen soft, normal BS, no masses, no tenderness EXTREMITIES:  Able to move X 4 extremities  NEUROLOGICAL: There is no tremor. Speech is clear. Alert to self and time, disoriented to place. PSYCHIATRIC:  Affect and behavior are appropriate   Labs reviewed: Recent Labs    02/17/19 0700 03/02/19 0600 03/08/19 1645  NA 141 140 139  K 3.0* 3.3* 3.8  CL 108 108  105  CO2 27 24 24   GLUCOSE 83 80 109*  BUN 14 16 20   CREATININE 0.90 0.83 0.99  CALCIUM 8.4* 8.5* 8.6*   Recent Labs    05/21/18 1310 10/14/18 0600  AST 20 19  ALT <5* 7  ALKPHOS 79 88  BILITOT 0.6 0.4  PROT 6.8 6.4*  ALBUMIN 4.3 3.9   Recent Labs    05/21/18 1310 07/27/18 0600 08/04/18 0600 01/06/19 0630  WBC 6.5 4.7 5.2 5.0  NEUTROABS 5.1  --  2.9 2.9  HGB 12.3* 10.7* 11.3* 9.9*  HCT 36.2* 31.1* 33.1* 31.2*  MCV 105.6* 102.4* 100.6* 102.3*  PLT 217 215 233 196   Lab Results  Component Value Date   TSH 3.787 12/09/2017    Lab Results  Component Value Date   CHOL 136 11/29/2015   HDL 44 11/29/2015   LDLCALC 80 11/29/2015   TRIG 59 11/29/2015   CHOLHDL 3.1 11/29/2015     Assessment/Plan  1. Malignant neoplasm of prostate ([place]) -Stable, continue alfuzosin ER 10 mg 1 tab daily  2. Orthostatic hypotension due to Parkinson's disease ([place]) -BP is well controlled, continue midodrine 2.5 mg 1 tab 3 times a day, Sinemet 25-250 mg 1 tab 3 times a day, Sinemet CR 50-200 mg 1 tab at bedtime  3. Mild episode of recurrent major depressive disorder ([place]) -Verbalized being depressed, continue bupropion 75 mg 1 tab daily  4. Anxiety -Charge nurse reported that he starts getting anxious around 4 PM, will continue clonazepam 0.5 mg 1 tab at 4 PM and 1 mg 1 tab at 8 PM  5. Other insomnia -Sleep slightly at night, continue melatonin 3 mg 1 tab at bedtime   Family/ staff Communication: Discussed plan of care with resident.  Labs/tests ordered:  None  Goals of care:   Long-term care.   Durenda Age, DNP, FNP-BC [facility] [phone] (Monday-Friday 8:00 a.m. - 5:00 p.m.) [phone] (after hours)

## 2019-05-12 ENCOUNTER — Other Ambulatory Visit
Admission: RE | Admit: 2019-05-12 | Discharge: 2019-05-12 | Disposition: A | Discharge status: Home / Self Care | Payer: Medicare Other | Source: Ambulatory Visit | Attending: Internal Medicine | Admitting: Internal Medicine

## 2019-05-12 DIAGNOSIS — R3129 Other microscopic hematuria: Secondary | ICD-10-CM | POA: Insufficient documentation

## 2019-05-12 LAB — URINALYSIS, ROUTINE W REFLEX MICROSCOPIC
Bacteria, UA: NONE SEEN
Bilirubin Urine: NEGATIVE
Glucose, UA: NEGATIVE mg/dL
Ketones, ur: NEGATIVE mg/dL
Nitrite: NEGATIVE
Protein, ur: NEGATIVE mg/dL
Specific Gravity, Urine: 1.009 (ref 1.005–1.030)
Squamous Epithelial / HPF: NONE SEEN (ref 0–5)
WBC, UA: >50 WBC/hpf — ABNORMAL HIGH (ref 0–5)
pH: 6 (ref 5.0–8.0)

## 2019-05-13 ENCOUNTER — Other Ambulatory Visit: Payer: Self-pay | Admitting: Adult Health

## 2019-05-13 MED ORDER — CLONAZEPAM 0.5 MG PO TABS: 0.5000 mg | ORAL_TABLET | Freq: Every day | ORAL | 0 refills | Status: DC | PRN

## 2019-05-14 LAB — URINE CULTURE: Culture: >=100000 — AB

## 2019-05-21 ENCOUNTER — Other Ambulatory Visit: Payer: Self-pay

## 2019-05-21 ENCOUNTER — Non-Acute Institutional Stay: Payer: Medicare Other | Admitting: Student

## 2019-05-21 VITALS — BP 118/64 | HR 96 | Resp 16

## 2019-05-21 DIAGNOSIS — Z515 Encounter for palliative care: Secondary | ICD-10-CM

## 2019-05-21 NOTE — Progress Notes (Signed)
[facility] Pall[facility] Consult Note Telephone: [phone]  Fax: [phone]  PATIENT NAME: [name] [name] DOB: 26-Feb-1930 MRN: [id number]  PRIMARY CARE PROVIDER:   Kirk Ruths, MD  REFERRING PROVIDER:  Kirk Ruths, MD [facility] [place], [place] [address]  RESPONSIBLE PARTY: Daughter, Derl Barrow    ASSESSMENT: [name] [name] is sitting up to recliner upon arrival. He has pleasant affect. NP explained reason for Palliative visit. He answers direct questions. No anxiety or agitation observed during visit. Discussed symptom management, ongoing goals of care. Staff is encouraged to monitor behaviors to see if there is any improvement with his agitation once antibiotics are completed. He was also started on gabapentin recently; this may help with his anxiety as well. Palliative Medicine will continue to monitor for changes and declines; will make recommendations as needed. Message left for daughter to discuss today's visit.     RECOMMENDATIONS and PLAN:  1. Code status: DNR.  2. Medical goals of therapy: Palliative Medicine will continue to monitor for changes and declines; will make recommendations as needed.  3. Symptom management: Appetite: continue to monitor intake, encourage foods that patient enjoys. Agitation: continue clonazepam as directed; also now receiving gabapentin. Continue to provide redirection as needed.  4. Discharge Planning: [name] [name] will continue to reside at [facility] at [place].   Palliative Medicine will follow up in 8 weeks or sooner.   I spent 15 minutes providing this consultation,  from 11:00am to 11:15am. More than 50% of the time in this consultation was spent coordinating communication.   HISTORY OF PRESENT ILLNESS:  [name] [name] is a 85 y.o.  male with multiple medical problems including Parkinson's disease, CHF, atrial fibrillation,anemia, arthritis, prostate  cancer s/p surgery, peripheral neuropathy, spinal stenosis s/p lumbar laminectomy, psoriasis, history of upper GI bleed, history of nephrolithiasis, cataract extraction, depression, anxiety. [name] [name] resides at [facility] on [place] unit. He is currently being treated for a urinary tract infection; bactrim DS BID x 10 days. He has had increased agitation. Nurse Candace states agitation has been worse since Covid-19 and he has not been able to have a sitter and he has been more confined on unit. His appetite is poor in the mornings; he continues to stay up late at night and sleeps in late in the morning. Fair appetite for lunch and dinner meals. His most recent weight is 135 pounds; he was 136 pounds on last Palliative Medicine visit. He has had two falls in the past week. [name] [name] was recently started on gabapentin on 100mg  TID for pain on 05/18/2019. [name] [name] denies having pain, dyspnea or nausea. Palliative Care was asked to help address goals of care; he is seen for follow up visit today. No recent hospitalizations.   CODE STATUS: DNR  PPS: 30% HOSPICE ELIGIBILITY/DIAGNOSIS: TBD  PAST MEDICAL HISTORY:  Past Medical History:  Diagnosis Date  . Alzheimer's disease ([place]) 11/29/2015  . Anemia, unspecified    pernicious  . Anxiety    unspecified  . Arthritis   . Atrial fibrillation ([place])   . CHF (congestive heart failure) ([place]) 01/2006  . History of kidney stones   . Hyperlipidemia   . Nephrolithiasis   . Parkinson's disease ([place])   . Peripheral neuropathy   . Prostate cancer ([place]) 11/2006  . Psoriasis   . Skin cancer   . Sleep disturbance    REM  . Spinal stenosis    s/p lumbar laminectomy  .  Upper GI bleed    unspecified; history of AVMs  . Valvular heart disease   . Vertigo     SOCIAL HX:  Social History   Tobacco Use  . Smoking status: Former Smoker    Types: Cigarettes    Last attempt to quit: 03/30/1986    Years since quitting: 33.1  . Smokeless tobacco: Never Used   Substance Use Topics  . Alcohol use: No    ALLERGIES:  Allergies  Allergen Reactions  . Exelon [Rivastigmine Tartrate]     Increased agitation/irritability  . Other     Strawberries  . Penicillins      PERTINENT MEDICATIONS:  Outpatient Encounter Medications as of 05/21/2019  Medication Sig  . acetaminophen (TYLENOL) 325 MG tablet Take 650 mg by mouth every 4 (four) hours as needed. for pain/ increased temp. May be administered orally, per G-tube if needed or rectally if unable to swallow (separate order). Maximum of 3000 mg Acetaminophen in 24 hours.  Marland Kitchen acetaminophen (TYLENOL) 500 MG tablet Take 500 mg by mouth 2 (two) times daily. Maximum of 3000 mg of acetaminophen in 24 hours. Check prn acetaminophen order prior to administration due to limit.  Marland Kitchen alfuzosin (UROXATRAL) 10 MG 24 hr tablet Take 10 mg by mouth every evening.   Marland Kitchen BUPROPION HCL ER, SR, PO Take 75 mg by mouth daily.   . carbidopa-levodopa (SINEMET CR) 50-200 MG tablet Take 1 tablet by mouth at bedtime.   . carbidopa-levodopa (SINEMET IR) 25-250 MG tablet Take 1 tablet by mouth 3 (three) times daily.   . clonazePAM (KLONOPIN) 0.5 MG tablet Take 1 tablet (0.5 mg total) by mouth daily. For afternoon agitation, restlessness  . clonazePAM (KLONOPIN) 0.5 MG tablet Take 1 tablet (0.5 mg total) by mouth daily as needed for up to 14 days for anxiety.  . clonazePAM (KLONOPIN) 1 MG tablet Take 1 tablet (1 mg total) by mouth at bedtime.  . cyanocobalamin 1000 MCG tablet Take 1,000 mcg by mouth daily.  . cyclobenzaprine (FLEXERIL) 5 MG tablet Take 5 mg by mouth every 8 (eight) hours as needed (Back pain).  . furosemide (LASIX) 20 MG tablet Take 20 mg by mouth daily as needed (CHF).   . Melatonin 3 MG TABS Take 1 tablet by mouth at bedtime.  . midodrine (PROAMATINE) 2.5 MG tablet Take 2.5 mg by mouth 3 (three) times daily with meals. Take along with sinemet  . polyethylene glycol (MIRALAX / GLYCOLAX) packet Take 17 g by mouth 2 (two)  times daily. Mix with 4-6 ounces of fluid  . predniSONE (DELTASONE) 5 MG tablet Take 5 mg by mouth daily.   . Probiotic Product (RISA-BID PROBIOTIC) TABS Take 1 tablet by mouth 2 (two) times daily.  . sennosides-docusate sodium (SENOKOT-S) 8.6-50 MG tablet Take 2 tablets by mouth 3 (three) times daily.   . sertraline (ZOLOFT) 100 MG tablet Take 150 mg by mouth daily. Give 1-1/2 tablets to = 150 mg  . Skin Protectants, Misc. (ENDIT EX) Apply liberal amount topically to areas of skin irritation 2 times daily and prn. Ok to leave at bedside.  . sorbitol 70 % solution Take 30 mLs by mouth every 2 (two) hours as needed (constipation).   . triamcinolone cream (KENALOG) 0.1 % Apply 1 application topically daily as needed. Apply liberal amount topically as needed to areas of psoriasis   . hydrocortisone cream 1 % Apply 1 application topically 4 (four) times daily as needed for itching. For Psoriasis  . magnesium hydroxide (  MILK OF MAGNESIA) 400 MG/5ML suspension Take 30 mLs by mouth every 4 (four) hours as needed for mild constipation.  . NON FORMULARY Diet type:  Regular  . Nutritional Supplements (NUTRITIONAL SUPPLEMENT PO) Take 1 each by mouth 3 (three) times daily with meals. Magic Cup   No facility-administered encounter medications on file as of 05/21/2019.     PHYSICAL EXAM:   General: NAD, frail appearing, thin Cardiovascular: regular rate and rhythm Pulmonary: clear ant fields Abdomen: soft, nontender, + bowel sounds GU: no suprapubic tenderness Extremities: no edema Neurological: Weakness but otherwise nonfocal  Ezekiel Slocumb, NP

## 2019-05-25 ENCOUNTER — Emergency Department: Payer: Medicare Other

## 2019-05-25 ENCOUNTER — Other Ambulatory Visit: Payer: Self-pay

## 2019-05-25 ENCOUNTER — Encounter: Payer: Self-pay | Admitting: Emergency Medicine

## 2019-05-25 ENCOUNTER — Other Ambulatory Visit
Admission: RE | Admit: 2019-05-25 | Discharge: 2019-05-25 | Disposition: A | Discharge status: Home / Self Care | Payer: Medicare Other | Source: Skilled Nursing Facility | Attending: Internal Medicine | Admitting: Internal Medicine

## 2019-05-25 ENCOUNTER — Emergency Department
Admission: EM | Admit: 2019-05-25 | Discharge: 2019-05-25 | Disposition: A | Discharge status: Nursing Home (Includes ICF) | Payer: Medicare Other | Attending: Emergency Medicine | Admitting: Emergency Medicine

## 2019-05-25 DIAGNOSIS — Y939 Activity, unspecified: Secondary | ICD-10-CM | POA: Insufficient documentation

## 2019-05-25 DIAGNOSIS — G309 Alzheimer's disease, unspecified: Secondary | ICD-10-CM | POA: Diagnosis not present

## 2019-05-25 DIAGNOSIS — M542 Cervicalgia: Secondary | ICD-10-CM | POA: Insufficient documentation

## 2019-05-25 DIAGNOSIS — Z79899 Other long term (current) drug therapy: Secondary | ICD-10-CM | POA: Diagnosis not present

## 2019-05-25 DIAGNOSIS — Z8673 Personal history of transient ischemic attack (TIA), and cerebral infarction without residual deficits: Secondary | ICD-10-CM | POA: Insufficient documentation

## 2019-05-25 DIAGNOSIS — Y92129 Unspecified place in nursing home as the place of occurrence of the external cause: Secondary | ICD-10-CM | POA: Diagnosis not present

## 2019-05-25 DIAGNOSIS — W1830XA Fall on same level, unspecified, initial encounter: Secondary | ICD-10-CM | POA: Diagnosis not present

## 2019-05-25 DIAGNOSIS — R51 Headache: Secondary | ICD-10-CM | POA: Insufficient documentation

## 2019-05-25 DIAGNOSIS — Z87891 Personal history of nicotine dependence: Secondary | ICD-10-CM | POA: Diagnosis not present

## 2019-05-25 DIAGNOSIS — Y999 Unspecified external cause status: Secondary | ICD-10-CM | POA: Diagnosis not present

## 2019-05-25 DIAGNOSIS — W19XXXA Unspecified fall, initial encounter: Secondary | ICD-10-CM

## 2019-05-25 DIAGNOSIS — R410 Disorientation, unspecified: Secondary | ICD-10-CM | POA: Insufficient documentation

## 2019-05-25 DIAGNOSIS — G20 Parkinson's disease: Secondary | ICD-10-CM | POA: Diagnosis not present

## 2019-05-25 DIAGNOSIS — I509 Heart failure, unspecified: Secondary | ICD-10-CM | POA: Insufficient documentation

## 2019-05-25 DIAGNOSIS — Z8546 Personal history of malignant neoplasm of prostate: Secondary | ICD-10-CM | POA: Diagnosis not present

## 2019-05-25 LAB — COMPREHENSIVE METABOLIC PANEL
ALT: 6 U/L (ref 0–44)
AST: 24 U/L (ref 15–41)
Albumin: 3.9 g/dL (ref 3.5–5.0)
Alkaline Phosphatase: 108 U/L (ref 38–126)
Anion gap: 9 (ref 5–15)
BUN: 19 mg/dL (ref 8–23)
CO2: 22 mmol/L (ref 22–32)
Calcium: 8.3 mg/dL — ABNORMAL LOW (ref 8.9–10.3)
Chloride: 107 mmol/L (ref 98–111)
Creatinine, Ser: 1.04 mg/dL (ref 0.61–1.24)
GFR calc Af Amer: >60 mL/min (ref >60)
GFR calc non Af Amer: >60 mL/min (ref >60)
Glucose, Bld: 118 mg/dL — ABNORMAL HIGH (ref 70–99)
Potassium: 3.9 mmol/L (ref 3.5–5.1)
Sodium: 138 mmol/L (ref 135–145)
Total Bilirubin: 0.4 mg/dL (ref 0.3–1.2)
Total Protein: 6.6 g/dL (ref 6.5–8.1)

## 2019-05-25 LAB — CBC WITH DIFFERENTIAL/PLATELET
Abs Immature Granulocytes: 0.02 10*3/uL (ref 0.00–0.07)
Basophils Absolute: 0 10*3/uL (ref 0.0–0.1)
Basophils Relative: 0 %
Eosinophils Absolute: 0.1 10*3/uL (ref 0.0–0.5)
Eosinophils Relative: 1 %
HCT: 33.3 % — ABNORMAL LOW (ref 39.0–52.0)
Hemoglobin: 10.8 g/dL — ABNORMAL LOW (ref 13.0–17.0)
Immature Granulocytes: 0 %
Lymphocytes Relative: 10 %
Lymphs Abs: 0.6 10*3/uL — ABNORMAL LOW (ref 0.7–4.0)
MCH: 31.3 pg (ref 26.0–34.0)
MCHC: 32.4 g/dL (ref 30.0–36.0)
MCV: 96.5 fL (ref 80.0–100.0)
Monocytes Absolute: 0.5 10*3/uL (ref 0.1–1.0)
Monocytes Relative: 9 %
Neutro Abs: 4.8 10*3/uL (ref 1.7–7.7)
Neutrophils Relative %: 80 %
Platelets: 189 10*3/uL (ref 150–400)
RBC: 3.45 MIL/uL — ABNORMAL LOW (ref 4.22–5.81)
RDW: 15.1 % (ref 11.5–15.5)
WBC: 6 10*3/uL (ref 4.0–10.5)
nRBC: 0 % (ref 0.0–0.2)

## 2019-05-25 LAB — URINALYSIS, COMPLETE (UACMP) WITH MICROSCOPIC
Bacteria, UA: NONE SEEN
Bilirubin Urine: NEGATIVE
Glucose, UA: NEGATIVE mg/dL
Ketones, ur: 5 mg/dL — AB
Nitrite: NEGATIVE
Protein, ur: NEGATIVE mg/dL
Specific Gravity, Urine: 1.014 (ref 1.005–1.030)
Squamous Epithelial / HPF: NONE SEEN (ref 0–5)
WBC, UA: >50 WBC/hpf — ABNORMAL HIGH (ref 0–5)
pH: 6 (ref 5.0–8.0)

## 2019-05-25 NOTE — ED Triage Notes (Signed)
Pt presents to ED via [facility] from [facility] with c/o fall. Per EMS pt had unwitnessed fall. EMS reports cracked drywall to wall in his apartment and hematoma to top of head, no bleeding. EMS reports hx of parkinsons and is usually wheelchair bound.   147/80 CBG 105 HR 72 97% RA

## 2019-05-25 NOTE — ED Provider Notes (Signed)
[facility] Emergency Department Provider Note  ____________________________________________  Time seen: Approximately 7:41 PM  I have reviewed the triage vital signs and the nursing notes.   HISTORY  Chief Complaint Fall    HPI [name] [name] is a 85 y.o. male with a history of Alzheimer's, Parkinson's disease and CHF presents to the emergency department after patient fell and hit his head during an unwitnessed fall.  EMS reports that there was a crack in drywall to patient's apartment when they arrived.  Patient is typically bound to wheelchair and does not ambulate.  Patient is unable to provide historical information during history.  Consequently, review of systems is limited.        Past Medical History:  Diagnosis Date  . Alzheimer's disease ([place]) 11/29/2015  . Anemia, unspecified    pernicious  . Anxiety    unspecified  . Arthritis   . Atrial fibrillation ([place])   . CHF (congestive heart failure) ([place]) 01/2006  . History of kidney stones   . Hyperlipidemia   . Nephrolithiasis   . Parkinson's disease ([place])   . Peripheral neuropathy   . Prostate cancer ([place]) 11/2006  . Psoriasis   . Skin cancer   . Sleep disturbance    REM  . Spinal stenosis    s/p lumbar laminectomy  . Upper GI bleed    unspecified; history of AVMs  . Valvular heart disease   . Vertigo     Patient Active Problem List   Diagnosis Date Noted  . Bilateral lower extremity edema 02/07/2019  . Orthostatic hypotension due to Parkinson's disease ([place]) 01/10/2019  . Palliative care encounter 01/07/2019  . Weakness generalized 01/07/2019  . Memory loss 01/07/2019  . Appetite impaired 01/07/2019  . Unintentional weight loss of more than 10 pounds in 90 days 10/21/2018  . Hematuria, gross 08/01/2018  . Psychosis in elderly with behavioral disturbance ([place]) 07/20/2018  . Major depressive disorder, recurrent ([place]) 03/25/2017  . Anemia 03/25/2017  . Impaired gait and  mobility 03/25/2017  . Chronic idiopathic constipation 03/25/2017  . 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 Psoriasis 12/16/2016  . REM sleep behavior disorder 09/09/2016  . Alzheimer's disease ([place]) 11/29/2015  . Cerebral infarction ([place]) 11/28/2015  . Atrial fibrillation ([place]) 08/01/2014  . Anxiety 05/11/2014  . Congestive heart failure ([place]) 05/11/2014  . Hyperlipidemia 05/11/2014  . Parkinson's disease ([place]) 05/11/2014  . Polyneuropathy 05/11/2014  . Malignant neoplasm of prostate ([place]) 05/11/2014  . Psoriatic arthritis ([place]) 05/11/2014  . Spinal stenosis, lumbar region, without neurogenic claudication 05/11/2014  . Heart valve disease 05/11/2014    Past Surgical History:  Procedure Laterality Date  . CATARACT EXTRACTION    . CYSTOSCOPY WITH FULGERATION N/A 10/02/2018   Procedure: CYSTOSCOPY WITH FULGERATION;  Surgeon: Billey Co, MD;  Location: [facility] ORS;  Service: Urology;  Laterality: N/A;  . EYE SURGERY    . INTERAL BLEEDING  10/2011   stomach  . MOHS SURGERY     Facial skin   . PROSTATE SURGERY    . SPINE SURGERY     lumbar laminectomy  . TRANSURETHRAL RESECTION OF BLADDER TUMOR WITH MITOMYCIN-C N/A 10/02/2018   Procedure: TRANSURETHRAL RESECTION OF BLADDER TUMOR WITH GEMCITABINE;  Surgeon: Billey Co, MD;  Location: [facility] ORS;  Service: Urology;  Laterality: N/A;    Prior to Admission medications   Medication Sig Start Date End Date Taking? Authorizing Provider  acetaminophen (TYLENOL) 325 MG tablet Take 650 mg by mouth every 4 (four)  hours as needed. for pain/ increased temp. May be administered orally, per G-tube if needed or rectally if unable to swallow (separate order). Maximum of 3000 mg Acetaminophen in 24 hours.    [provider]  acetaminophen (TYLENOL) 500 MG tablet Take 500 mg by mouth 2 (two) times daily. Maximum of 3000 mg of acetaminophen in 24 hours. Check prn acetaminophen order prior to administration due to limit.     [provider]  alfuzosin (UROXATRAL) 10 MG 24 hr tablet Take 10 mg by mouth every evening.     [provider]  BUPROPION HCL ER, SR, PO Take 75 mg by mouth daily.  10/13/18   [provider]  carbidopa-levodopa (SINEMET CR) 50-200 MG tablet Take 1 tablet by mouth at bedtime.  12/16/18   [provider]  carbidopa-levodopa (SINEMET IR) 25-250 MG tablet Take 1 tablet by mouth 3 (three) times daily.  12/16/18   [provider]  clonazePAM (KLONOPIN) 0.5 MG tablet Take 1 tablet (0.5 mg total) by mouth daily. For afternoon agitation, restlessness 04/26/19   Medina-Vargas, Monina C, NP  clonazePAM (KLONOPIN) 0.5 MG tablet Take 1 tablet (0.5 mg total) by mouth daily as needed for up to 14 days for anxiety. 05/13/19 05/27/19  Medina-Vargas, Monina C, NP  clonazePAM (KLONOPIN) 1 MG tablet Take 1 tablet (1 mg total) by mouth at bedtime. 04/26/19   Medina-Vargas, Monina C, NP  cyanocobalamin 1000 MCG tablet Take 1,000 mcg by mouth daily.    [provider]  cyclobenzaprine (FLEXERIL) 5 MG tablet Take 5 mg by mouth every 8 (eight) hours as needed (Back pain).    [provider]  furosemide (LASIX) 20 MG tablet Take 20 mg by mouth daily as needed (CHF).  02/07/19   [provider]  hydrocortisone cream 1 % Apply 1 application topically 4 (four) times daily as needed for itching. For Psoriasis 12/24/18   [provider]  magnesium hydroxide (MILK OF MAGNESIA) 400 MG/5ML suspension Take 30 mLs by mouth every 4 (four) hours as needed for mild constipation.    [provider]  Melatonin 3 MG TABS Take 1 tablet by mouth at bedtime. 09/26/18   [provider]  midodrine (PROAMATINE) 2.5 MG tablet Take 2.5 mg by mouth 3 (three) times daily with meals. Take along with sinemet    [provider]  NON FORMULARY Diet type:  Regular    [provider]  Nutritional Supplements (NUTRITIONAL SUPPLEMENT PO) Take 1  each by mouth 3 (three) times daily with meals. Magic Cup    [provider]  polyethylene glycol (MIRALAX / GLYCOLAX) packet Take 17 g by mouth 2 (two) times daily. Mix with 4-6 ounces of fluid    [provider]  predniSONE (DELTASONE) 5 MG tablet Take 5 mg by mouth daily.  11/21/15   [provider]  Probiotic Product (RISA-BID PROBIOTIC) TABS Take 1 tablet by mouth 2 (two) times daily.    [provider]  sennosides-docusate sodium (SENOKOT-S) 8.6-50 MG tablet Take 2 tablets by mouth 3 (three) times daily.     [provider]  sertraline (ZOLOFT) 100 MG tablet Take 150 mg by mouth daily. Give 1-1/2 tablets to = 150 mg 10/13/18   [provider]  Skin Protectants, Misc. (ENDIT EX) Apply liberal amount topically to areas of skin irritation 2 times daily and prn. Ok to leave at bedside.    [provider]  sorbitol 70 % solution Take 30 mLs  by mouth every 2 (two) hours as needed (constipation).     [provider]  triamcinolone cream (KENALOG) 0.1 % Apply 1 application topically daily as needed. Apply liberal amount topically as needed to areas of psoriasis  02/24/19   [provider]    Allergies Exelon [rivastigmine tartrate]; Other; and Penicillins  Family History  Problem Relation Age of Onset  . Hypertension Mother   . Heart disease Father   . Heart disease Brother     Social History Social History   Tobacco Use  . Smoking status: Former Smoker    Types: Cigarettes    Last attempt to quit: 03/30/1986    Years since quitting: 33.1  . Smokeless tobacco: Never Used  Substance Use Topics  . Alcohol use: No  . Drug use: No        ____________________________________________   PHYSICAL EXAM:  VITAL SIGNS: ED Triage Vitals  Enc Vitals Group     BP 05/25/19 1852 (!) 142/67     Pulse Rate 05/25/19 1852 76     Resp 05/25/19 1852 16     Temp 05/25/19 1852 97.6 F (36.4 C)     Temp Source  05/25/19 1852 Oral     SpO2 05/25/19 1852 99 %     Weight 05/25/19 1849 135 lb 12.8 oz (61.6 kg)     Height 05/25/19 1849 5\' 8"  (1.727 m)     Head Circumference --      Peak Flow --      Pain Score 05/25/19 1849 0     Pain Loc --      Pain Edu? --      Excl. in [place]? --      Constitutional: Alert.  Patient has rambling speech and is not able to provide coherent history. Eyes: Conjunctivae are normal. PERRL. EOMI. Head: Atraumatic. Scalp hematoma visualized.  ENT:      Ears: TMs are pearly.       Nose: No congestion/rhinnorhea.      Mouth/Throat: Mucous membranes are moist.  Neck: No stridor.  No cervical spine tenderness to palpation. Cardiovascular: Normal rate, regular rhythm. Normal S1 and S2.  Good peripheral circulation. Respiratory: Normal respiratory effort without tachypnea or retractions. Lungs CTAB. Good air entry to the bases with no decreased or absent breath sounds. Gastrointestinal: Bowel sounds 4 quadrants. Soft and nontender to palpation. No guarding or rigidity. No palpable masses. No distention. No CVA tenderness. Musculoskeletal: No gross deformities appreciated.  Patient has 5 out of 5 strength in the upper and lower extremities bilaterally and symmetrically. Neurologic: Patient has intact sensation in the upper and lower extremities. Skin:  Skin is warm, dry and intact. No rash noted. Psychiatric: Mood and affect are normal. Speech and behavior are normal. Patient exhibits appropriate insight and judgement.   ____________________________________________   LABS (all labs ordered are listed, but only abnormal results are displayed)  Labs Reviewed  CBC WITH DIFFERENTIAL/PLATELET - Abnormal; Notable for the following components:      Result Value   RBC 3.45 (*)    Hemoglobin 10.8 (*)    HCT 33.3 (*)    Lymphs Abs 0.6 (*)    All other components within normal limits  COMPREHENSIVE METABOLIC PANEL - Abnormal; Notable for the following components:   Glucose,  Bld 118 (*)    Calcium 8.3 (*)    All other components within normal limits   ____________________________________________  EKG   ____________________________________________  RADIOLOGY I personally viewed and evaluated  these images as part of my medical decision making, as well as reviewing the written report by the radiologist.  Ct Head Wo Contrast  Result Date: 05/25/2019 CLINICAL DATA:  Recent fall with headaches and neck pain, initial encounter EXAM: CT HEAD WITHOUT CONTRAST CT CERVICAL SPINE WITHOUT CONTRAST TECHNIQUE: Multidetector CT imaging of the head and cervical spine was performed following the standard protocol without intravenous contrast. Multiplanar CT image reconstructions of the cervical spine were also generated. COMPARISON:  01/27/2016 FINDINGS: CT HEAD FINDINGS Brain: Chronic atrophic and ischemic changes are noted. No findings to suggest acute hemorrhage, acute infarction or space-occupying mass lesion are noted. Vascular: No hyperdense vessel or unexpected calcification. Skull: Normal. Negative for fracture or focal lesion. Sinuses/Orbits: No acute finding. Other: None. CT CERVICAL SPINE FINDINGS Alignment: Within normal limits. Skull base and vertebrae: 7 cervical segments are well visualized. Vertebral body height is well maintained. Degenerative anterolisthesis of C7 on T1 is noted. Disc space narrowing is noted from C3-C7 with associated osteophytic changes. No acute fracture or acute facet abnormality is noted. Soft tissues and spinal canal: Surrounding soft tissues are within normal limits. Upper chest: Visualized lung apices are unremarkable. Other: None IMPRESSION: CT of the head: Chronic atrophic and ischemic changes. CT of the cervical spine: Multilevel degenerative change without acute bony abnormality. Degenerative anterolisthesis of C7 on T1 is noted. Electronically Signed   By: Inez Catalina M.D.   On: 05/25/2019 19:21   Ct Cervical Spine Wo Contrast  Result  Date: 05/25/2019 CLINICAL DATA:  Recent fall with headaches and neck pain, initial encounter EXAM: CT HEAD WITHOUT CONTRAST CT CERVICAL SPINE WITHOUT CONTRAST TECHNIQUE: Multidetector CT imaging of the head and cervical spine was performed following the standard protocol without intravenous contrast. Multiplanar CT image reconstructions of the cervical spine were also generated. COMPARISON:  01/27/2016 FINDINGS: CT HEAD FINDINGS Brain: Chronic atrophic and ischemic changes are noted. No findings to suggest acute hemorrhage, acute infarction or space-occupying mass lesion are noted. Vascular: No hyperdense vessel or unexpected calcification. Skull: Normal. Negative for fracture or focal lesion. Sinuses/Orbits: No acute finding. Other: None. CT CERVICAL SPINE FINDINGS Alignment: Within normal limits. Skull base and vertebrae: 7 cervical segments are well visualized. Vertebral body height is well maintained. Degenerative anterolisthesis of C7 on T1 is noted. Disc space narrowing is noted from C3-C7 with associated osteophytic changes. No acute fracture or acute facet abnormality is noted. Soft tissues and spinal canal: Surrounding soft tissues are within normal limits. Upper chest: Visualized lung apices are unremarkable. Other: None IMPRESSION: CT of the head: Chronic atrophic and ischemic changes. CT of the cervical spine: Multilevel degenerative change without acute bony abnormality. Degenerative anterolisthesis of C7 on T1 is noted. Electronically Signed   By: Inez Catalina M.D.   On: 05/25/2019 19:21    ____________________________________________    PROCEDURES  Procedure(s) performed:    Procedures    Medications - No data to display   ____________________________________________   INITIAL IMPRESSION / ASSESSMENT AND PLAN / ED COURSE  Pertinent labs & imaging results that were available during my care of the patient were reviewed by me and considered in my medical decision making (see chart  for details).  Review of the [place] CSRS was performed in accordance of the [place] prior to dispensing any controlled drugs.        Assessment and Plan:  Fall:  85 year old male presents to the emergency department after an unwitnessed fall that occurred earlier in the evening.  Physical exam, vital  signs were reassuring.  Patient had no increased work of breathing.  He was able to perform basic commands on neuro exam.  He had symmetric strength and sensation was intact.  Scalp hematoma was visualized on physical exam.  Differential diagnosis included subdural hematoma, subarachnoid hemorrhage, skull fracture, C-spine fracture, UTI..  CT head and cervical spine revealed no acute abnormality.  Patient had urinalysis obtained this morning which revealed a large amount of leuks and a small amount of blood which was consistent with prior urinalysis obtained on 05/12/2019.  Urine culture is pending.  Patient seems to be at baseline prior to discharge.  Tylenol was recommended for discomfort.  All patient questions were answered.   ____________________________________________  FINAL CLINICAL IMPRESSION(S) / ED DIAGNOSES  Final diagnoses:  Fall, initial encounter      NEW MEDICATIONS STARTED DURING THIS VISIT:  ED Discharge Orders    None          This chart was dictated using voice recognition software/Dragon. Despite best efforts to proofread, errors can occur which can change the meaning. Any change was purely unintentional.    Lannie Fields, PA-C 05/25/19 2024    Harvest Dark, MD 05/25/19 2240

## 2019-05-25 NOTE — ED Notes (Signed)
[facility]  CALLED  FOR  TRANSPORT 

## 2019-05-27 ENCOUNTER — Non-Acute Institutional Stay (SKILLED_NURSING_FACILITY): Payer: Medicare Other | Admitting: Adult Health

## 2019-05-27 ENCOUNTER — Encounter: Payer: Self-pay | Admitting: Adult Health

## 2019-05-27 ENCOUNTER — Other Ambulatory Visit: Payer: Self-pay | Admitting: Adult Health

## 2019-05-27 DIAGNOSIS — F419 Anxiety disorder, unspecified: Secondary | ICD-10-CM

## 2019-05-27 DIAGNOSIS — F330 Major depressive disorder, recurrent, mild: Secondary | ICD-10-CM

## 2019-05-27 LAB — URINE CULTURE: Culture: >=100000 — AB

## 2019-05-27 MED ORDER — CLONAZEPAM 0.5 MG PO TABS: 0.5000 mg | ORAL_TABLET | Freq: Two times a day (BID) | ORAL | 0 refills | Status: DC | PRN

## 2019-05-27 NOTE — Progress Notes (Signed)
Location:  [facility] Room Number: 924Q [place]:  SNF ([phone]) Provider:  Durenda Age, DNP, FNP-BC  Patient Care Team: Kirk Ruths, MD as PCP - General (Internal Medicine) Nyoka Cowden Phylis Bougie, NP as Nurse Practitioner (Geriatric Medicine)  Extended Emergency Contact Information Primary Emergency Contact: [place] Phone: [phone] Relation: Daughter Secondary Emergency Contact: [place], [place] Mobile Phone: [phone] Relation: Son Preferred language: English Interpreter needed? No  Code Status:  DNR  Goals of care: Advanced Directive information Advanced Directives 05/25/2019  Does Patient Have a Medical Advance Directive? Yes  Type of Advance Directive Out of facility DNR (pink MOST or yellow form)  Does patient want to make changes to medical advance directive? No - Patient declined  Copy of [place] in Chart? -  Pre-existing out of facility DNR order (yellow form or pink MOST form) Yellow form placed in chart (order not valid for inpatient use)     Chief Complaint  Patient presents with   Acute Visit    Anxiety    HPI:  Pt is an 85 y.o. male seen today for an acute visit due to anxiety. He is a long-term care resident of [place]. He has a PMH of Parkinson's, CVA, spinal stenosis, upper GI bleed, and peripheral neuropathy. He was recently transferred to hospital S/P fall and hitting his head on the wall creating a crack on the wall. CT of head was negative for acute findings. Charge nurse reported that he would have anxiety episodes and would sometimes try to pick up something from the floor and would fall. He has Clonazepam 0.5 mg 0.5 mg Q D PRN. He, also, takes  Clonazepam twice a day routinely and Sertraline for depression. An attempted GDR is likely to result in impairment of function or increased distressed behavior.   Past Medical History:  Diagnosis Date    Alzheimer's disease ([place]) 11/29/2015   Anemia, unspecified    pernicious   Anxiety    unspecified   Arthritis    Atrial fibrillation (HCC)    CHF (congestive heart failure) ([employer]) 01/2006   History of kidney stones    Hyperlipidemia    Nephrolithiasis    Parkinson's disease ([place])    Peripheral neuropathy    Prostate cancer ([place]) 11/2006   Psoriasis    Skin cancer    Sleep disturbance    REM   Spinal stenosis    s/p lumbar laminectomy   Upper GI bleed    unspecified; history of AVMs   Valvular heart disease    Vertigo    Past Surgical History:  Procedure Laterality Date   CATARACT EXTRACTION     CYSTOSCOPY WITH FULGERATION N/A 10/02/2018   Procedure: CYSTOSCOPY WITH FULGERATION;  Surgeon: Billey Co, MD;  Location: [facility] ORS;  Service: Urology;  Laterality: N/A;   EYE SURGERY     INTERAL BLEEDING  10/2011   stomach   MOHS SURGERY     Facial skin    PROSTATE SURGERY     SPINE SURGERY     lumbar laminectomy   TRANSURETHRAL RESECTION OF BLADDER TUMOR WITH MITOMYCIN-C N/A 10/02/2018   Procedure: TRANSURETHRAL RESECTION OF BLADDER TUMOR WITH GEMCITABINE;  Surgeon: Billey Co, MD;  Location: [facility] ORS;  Service: Urology;  Laterality: N/A;    Allergies  Allergen Reactions   Exelon [Rivastigmine Tartrate]     Increased agitation/irritability   Other     Strawberries  Penicillins     Outpatient Encounter Medications as of 05/27/2019  Medication Sig   acetaminophen (TYLENOL) 325 MG tablet Take 650 mg by mouth every 4 (four) hours as needed. for pain/ increased temp. May be administered orally, per G-tube if needed or rectally if unable to swallow (separate order). Maximum of 3000 mg Acetaminophen in 24 hours.   acetaminophen (TYLENOL) 500 MG tablet Take 500 mg by mouth 2 (two) times daily. Maximum of 3000 mg of acetaminophen in 24 hours. Check prn acetaminophen order prior to administration due to limit.   alfuzosin (UROXATRAL) 10 MG  24 hr tablet Take 10 mg by mouth every evening.    buPROPion (WELLBUTRIN) 75 MG tablet Take 75 mg by mouth daily.   carbidopa-levodopa (SINEMET CR) 50-200 MG tablet Take 1 tablet by mouth at bedtime.    carbidopa-levodopa (SINEMET IR) 25-250 MG tablet Take 1 tablet by mouth 3 (three) times daily.    clonazePAM (KLONOPIN) 0.5 MG tablet Take 1 tablet (0.5 mg total) by mouth every 12 (twelve) hours as needed for up to 14 days for anxiety.   clonazePAM (KLONOPIN) 1 MG tablet Take 1 tablet (1 mg total) by mouth at bedtime.   cyanocobalamin 1000 MCG tablet Take 1,000 mcg by mouth daily.   cyclobenzaprine (FLEXERIL) 5 MG tablet Take 5 mg by mouth every 8 (eight) hours as needed (Back pain).   furosemide (LASIX) 20 MG tablet Take 20 mg by mouth daily as needed (CHF).    gabapentin (NEURONTIN) 100 MG capsule Take 200 mg by mouth 3 (three) times daily. 2 capsules to = 200 mg = 8 AM, Noon, 4PM   hydrocortisone cream 1 % Apply 1 application topically 4 (four) times daily as needed for itching. For Psoriasis   magnesium hydroxide (MILK OF MAGNESIA) 400 MG/5ML suspension Take 30 mLs by mouth every 4 (four) hours as needed for mild constipation.   Melatonin 3 MG TABS Take 1 tablet by mouth at bedtime.   midodrine (PROAMATINE) 2.5 MG tablet Take 2.5 mg by mouth 3 (three) times daily with meals. Take along with sinemet   NON FORMULARY Diet type:  Regular   Nutritional Supplements (NUTRITIONAL SUPPLEMENT PO) Take 1 each by mouth 3 (three) times daily with meals. Magic Cup   polyethylene glycol (MIRALAX / GLYCOLAX) packet Take 17 g by mouth 2 (two) times daily. Mix with 4-6 ounces of fluid   predniSONE (DELTASONE) 5 MG tablet Take 5 mg by mouth daily.    Probiotic Product (RISA-BID PROBIOTIC) TABS Take 1 tablet by mouth 2 (two) times daily.   sennosides-docusate sodium (SENOKOT-S) 8.6-50 MG tablet Take 2 tablets by mouth 3 (three) times daily.    sertraline (ZOLOFT) 100 MG tablet Take 150 mg by  mouth daily. Give 1-1/2 tablets to = 150 mg   Skin Protectants, Misc. (ENDIT EX) Apply liberal amount topically to areas of skin irritation 2 times daily and prn. Ok to leave at bedside.   sorbitol 70 % solution Take 30 mLs by mouth every 2 (two) hours as needed (constipation).    triamcinolone cream (KENALOG) 0.1 % Apply 1 application topically daily as needed. Apply liberal amount topically as needed to areas of psoriasis    [DISCONTINUED] BUPROPION HCL ER, SR, PO Take 75 mg by mouth daily.    [DISCONTINUED] clonazePAM (KLONOPIN) 0.5 MG tablet Take 1 tablet (0.5 mg total) by mouth daily. For afternoon agitation, restlessness   No facility-administered encounter medications on file as of 05/27/2019.  Review of Systems  GENERAL: No fever or chills MOUTH and THROAT: Denies oral discomfort, gingival pain RESPIRATORY: no cough, SOB, DOE, wheezing, hemoptysis CARDIAC: No chest pain, edema or palpitations GI: No abdominal pain, diarrhea, constipation, heart burn, nausea or vomiting NEUROLOGICAL: Denies dizziness, syncope, numbness, or headache PSYCHIATRIC: Denies feelings of depression or anxiety. No report of hallucinations, insomnia, paranoia, or agitation   Immunization History  Administered Date(s) Administered   Influenza, High Dose Seasonal PF 09/22/2017   Influenza-Unspecified 10/09/2012, 09/28/2016, 10/05/2018   PPD Test 10/12/2016, 09/29/2017, 09/29/2018   Pneumococcal Conjugate-13 02/12/2019   Pneumococcal-Unspecified 05/13/2014   Pertinent  Health Maintenance Due  Topic Date Due   INFLUENZA VACCINE  07/31/2019   PNA vac Low Risk Adult  Completed    Vitals:   05/27/19 2331  BP: 111/81  Pulse: 86  Resp: 18  Temp: 98.5 F (36.9 C)  SpO2: 97%  Weight: 135 lb (61.2 kg)  Height: 5\' 8"  (1.727 m)   Body mass index is 20.53 kg/m.  Physical Exam  GENERAL APPEARANCE:  In no acute distress. Normal body habitus SKIN:  Skin is warm and dry.  MOUTH and THROAT:  Lips are without lesions. Oral mucosa is moist and without lesions. Tongue is normal in shape, size, and color and without lesions RESPIRATORY: Breathing is even & unlabored, BS CTAB CARDIAC: RRR, no murmur,no extra heart sounds, no edema GI: Abdomen soft, normal BS, no masses, no tenderness EXTREMITIES:  Able to move X 4 extremities NEUROLOGICAL: There is no tremor. Speech is clear. Alert to self and time, disoriented to place. PSYCHIATRIC:  Sad affect  Labs reviewed: Recent Labs    03/02/19 0600 03/08/19 1645 05/25/19 1938  NA 140 139 138  K 3.3* 3.8 3.9  CL 108 105 107  CO2 24 24 22   GLUCOSE 80 109* 118*  BUN 16 20 19   CREATININE 0.83 0.99 1.04  CALCIUM 8.5* 8.6* 8.3*   Recent Labs    10/14/18 0600 05/25/19 1938  AST 19 24  ALT 7 6  ALKPHOS 88 108  BILITOT 0.4 0.4  PROT 6.4* 6.6  ALBUMIN 3.9 3.9   Recent Labs    08/04/18 0600 01/06/19 0630 05/25/19 1938  WBC 5.2 5.0 6.0  NEUTROABS 2.9 2.9 4.8  HGB 11.3* 9.9* 10.8*  HCT 33.1* 31.2* 33.3*  MCV 100.6* 102.3* 96.5  PLT 233 196 189   Lab Results  Component Value Date   TSH 3.787 12/09/2017    Lab Results  Component Value Date   CHOL 136 11/29/2015   HDL 44 11/29/2015   LDLCALC 80 11/29/2015   TRIG 59 11/29/2015   CHOLHDL 3.1 11/29/2015    Significant Diagnostic Results in last 30 days:  Ct Head Wo Contrast  Result Date: 05/25/2019 CLINICAL DATA:  Recent fall with headaches and neck pain, initial encounter EXAM: CT HEAD WITHOUT CONTRAST CT CERVICAL SPINE WITHOUT CONTRAST TECHNIQUE: Multidetector CT imaging of the head and cervical spine was performed following the standard protocol without intravenous contrast. Multiplanar CT image reconstructions of the cervical spine were also generated. COMPARISON:  01/27/2016 FINDINGS: CT HEAD FINDINGS Brain: Chronic atrophic and ischemic changes are noted. No findings to suggest acute hemorrhage, acute infarction or space-occupying mass lesion are noted. Vascular: No  hyperdense vessel or unexpected calcification. Skull: Normal. Negative for fracture or focal lesion. Sinuses/Orbits: No acute finding. Other: None. CT CERVICAL SPINE FINDINGS Alignment: Within normal limits. Skull base and vertebrae: 7 cervical segments are well visualized. Vertebral body height is well  maintained. Degenerative anterolisthesis of C7 on T1 is noted. Disc space narrowing is noted from C3-C7 with associated osteophytic changes. No acute fracture or acute facet abnormality is noted. Soft tissues and spinal canal: Surrounding soft tissues are within normal limits. Upper chest: Visualized lung apices are unremarkable. Other: None IMPRESSION: CT of the head: Chronic atrophic and ischemic changes. CT of the cervical spine: Multilevel degenerative change without acute bony abnormality. Degenerative anterolisthesis of C7 on T1 is noted. Electronically Signed   By: Inez Catalina M.D.   On: 05/25/2019 19:21   Ct Cervical Spine Wo Contrast  Result Date: 05/25/2019 CLINICAL DATA:  Recent fall with headaches and neck pain, initial encounter EXAM: CT HEAD WITHOUT CONTRAST CT CERVICAL SPINE WITHOUT CONTRAST TECHNIQUE: Multidetector CT imaging of the head and cervical spine was performed following the standard protocol without intravenous contrast. Multiplanar CT image reconstructions of the cervical spine were also generated. COMPARISON:  01/27/2016 FINDINGS: CT HEAD FINDINGS Brain: Chronic atrophic and ischemic changes are noted. No findings to suggest acute hemorrhage, acute infarction or space-occupying mass lesion are noted. Vascular: No hyperdense vessel or unexpected calcification. Skull: Normal. Negative for fracture or focal lesion. Sinuses/Orbits: No acute finding. Other: None. CT CERVICAL SPINE FINDINGS Alignment: Within normal limits. Skull base and vertebrae: 7 cervical segments are well visualized. Vertebral body height is well maintained. Degenerative anterolisthesis of C7 on T1 is noted. Disc space  narrowing is noted from C3-C7 with associated osteophytic changes. No acute fracture or acute facet abnormality is noted. Soft tissues and spinal canal: Surrounding soft tissues are within normal limits. Upper chest: Visualized lung apices are unremarkable. Other: None IMPRESSION: CT of the head: Chronic atrophic and ischemic changes. CT of the cervical spine: Multilevel degenerative change without acute bony abnormality. Degenerative anterolisthesis of C7 on T1 is noted. Electronically Signed   By: Inez Catalina M.D.   On: 05/25/2019 19:21    Assessment/Plan  1. Anxiety - will increase Clonazepam 0.5 mg 1 tab from Q D PRN to Q 12 hours PRN, continue Clonazepam 1 mg Q 4 PM and 0.5 mg Q HS, continue to monitor behavior and for sedation, fall precautions  2. Mild episode of recurrent major depressive disorder (HCC) - has sad affect, continue Sertraline 100 mg 1 1/2 tab = 150 mg daily, an attempted GDR is likely to result in impairment of function or increased distressed behavior.    Family/ staff Communication: Discussed plan of care with resident and charge nurse.  Labs/tests ordered:  None  Goals of care:   Long-term care   Durenda Age, DNP, FNP-BC [facility] [phone] (Monday-Friday 8:00 a.m. - 5:00 p.m.) [phone] (after hours)

## 2019-05-30 NOTE — Addendum Note (Signed)
Addended by: Durenda Age C on: 05/30/2019 10:33 PM   Modules accepted: Level of Service

## 2019-05-31 ENCOUNTER — Encounter
Admission: RE | Admit: 2019-05-31 | Discharge: 2019-05-31 | Disposition: A | Discharge status: Home / Self Care | Payer: Medicare Other | Source: Ambulatory Visit | Attending: Internal Medicine | Admitting: Internal Medicine

## 2019-05-31 DIAGNOSIS — R319 Hematuria, unspecified: Secondary | ICD-10-CM | POA: Insufficient documentation

## 2019-06-03 ENCOUNTER — Encounter: Payer: Self-pay | Admitting: Adult Health

## 2019-06-03 ENCOUNTER — Non-Acute Institutional Stay (SKILLED_NURSING_FACILITY): Payer: Medicare Other | Admitting: Adult Health

## 2019-06-03 DIAGNOSIS — F0281 Dementia in other diseases classified elsewhere with behavioral disturbance: Secondary | ICD-10-CM

## 2019-06-03 DIAGNOSIS — I5022 Chronic systolic (congestive) heart failure: Secondary | ICD-10-CM

## 2019-06-03 DIAGNOSIS — N390 Urinary tract infection, site not specified: Secondary | ICD-10-CM

## 2019-06-03 DIAGNOSIS — G301 Alzheimer's disease with late onset: Secondary | ICD-10-CM

## 2019-06-03 DIAGNOSIS — R627 Adult failure to thrive: Secondary | ICD-10-CM

## 2019-06-03 DIAGNOSIS — F419 Anxiety disorder, unspecified: Secondary | ICD-10-CM

## 2019-06-03 DIAGNOSIS — C61 Malignant neoplasm of prostate: Secondary | ICD-10-CM

## 2019-06-03 DIAGNOSIS — G629 Polyneuropathy, unspecified: Secondary | ICD-10-CM

## 2019-06-03 DIAGNOSIS — F330 Major depressive disorder, recurrent, mild: Secondary | ICD-10-CM

## 2019-06-03 DIAGNOSIS — G20 Parkinson's disease: Secondary | ICD-10-CM | POA: Diagnosis not present

## 2019-06-03 DIAGNOSIS — D693 Immune thrombocytopenic purpura: Secondary | ICD-10-CM | POA: Diagnosis not present

## 2019-06-03 NOTE — Progress Notes (Signed)
[facility]:  [facility] Room Number: [address] [place]:  SNF [phone]) Provider:  Durenda Age, DNP, FNP-BC  Patient Care Team: Kirk Ruths, MD as PCP - General (Internal Medicine) Nyoka Cowden Phylis Bougie, NP as Nurse Practitioner (Geriatric Medicine)  Extended Emergency Contact Information Primary Emergency Contact: [place] Phone: [phone] Relation: Daughter Secondary Emergency Contact: [place], [place] Mobile Phone: [phone] Relation: Son Preferred language: English Interpreter needed? No  Code Status:  DNR  Goals of care: Advanced Directive information Advanced Directives 06/03/2019  Does Patient Have a Medical Advance Directive? Yes  Type of Advance Directive Out of facility DNR (pink MOST or yellow form)  Does patient want to make changes to medical advance directive? No - Patient declined  Copy of [place] in Chart? -  Pre-existing out of facility DNR order (yellow form or pink MOST form) Yellow form placed in chart (order not valid for inpatient use)     Chief Complaint  Patient presents with  . Medical Management of Chronic Issues    Routine Visit    HPI:  Pt is a 85 y.o. male seen today for medical management of chronic diseases.  He has PMH of Parkinson's disease, CVA, spinal stenosis, upper GI bleed and peripheral neuropathy. He was seen today by the nurses station playing pool. He was calm and responsive to queries. He was recently sent out to the ED S/P fall hitting his head on the wall causing a crack. He has poor appetite and had weight loss. He was recently started on  Macrobid for UTI. Latest weight is 103.2 lbs, had 31.8 lbs weight loss in a month. He is being followed by Palliative Care.  Past Medical History:  Diagnosis Date  . Alzheimer's disease ([place]) 11/29/2015  . Anemia, unspecified    pernicious  . Anxiety    unspecified  . Arthritis   . Atrial  fibrillation ([place])   . CHF (congestive heart failure) ([place]) 01/2006  . History of kidney stones   . Hyperlipidemia   . Nephrolithiasis   . Parkinson's disease ([place])   . Peripheral neuropathy   . Prostate cancer ([place]) 11/2006  . Psoriasis   . Skin cancer   . Sleep disturbance    REM  . Spinal stenosis    s/p lumbar laminectomy  . Upper GI bleed    unspecified; history of AVMs  . Valvular heart disease   . Vertigo    Past Surgical History:  Procedure Laterality Date  . CATARACT EXTRACTION    . CYSTOSCOPY WITH FULGERATION N/A 10/02/2018   Procedure: CYSTOSCOPY WITH FULGERATION;  Surgeon: Billey Co, MD;  Location: [facility] ORS;  Service: Urology;  Laterality: N/A;  . EYE SURGERY    . INTERAL BLEEDING  10/2011   stomach  . MOHS SURGERY     Facial skin   . PROSTATE SURGERY    . SPINE SURGERY     lumbar laminectomy  . TRANSURETHRAL RESECTION OF BLADDER TUMOR WITH MITOMYCIN-C N/A 10/02/2018   Procedure: TRANSURETHRAL RESECTION OF BLADDER TUMOR WITH GEMCITABINE;  Surgeon: Billey Co, MD;  Location: [facility] ORS;  Service: Urology;  Laterality: N/A;    Allergies  Allergen Reactions  . Exelon [Rivastigmine Tartrate]     Increased agitation/irritability  . Other     Strawberries  . Penicillins     Outpatient Encounter Medications as of 06/03/2019  Medication Sig  . acetaminophen (TYLENOL) 325 MG tablet Take  650 mg by mouth every 4 (four) hours as needed. for pain/ increased temp. May be administered orally, per G-tube if needed or rectally if unable to swallow (separate order). Maximum of 3000 mg Acetaminophen in 24 hours.  Marland Kitchen acetaminophen (TYLENOL) 500 MG tablet Take 500 mg by mouth 2 (two) times daily. Maximum of 3000 mg of acetaminophen in 24 hours. Check prn acetaminophen order prior to administration due to limit.  Marland Kitchen alfuzosin (UROXATRAL) 10 MG 24 hr tablet Take 10 mg by mouth every evening.   Marland Kitchen buPROPion (WELLBUTRIN) 75 MG tablet Take 75 mg by mouth daily.  .  carbidopa-levodopa (SINEMET CR) 50-200 MG tablet Take 1 tablet by mouth at bedtime.   . carbidopa-levodopa (SINEMET IR) 25-250 MG tablet Take 1 tablet by mouth 3 (three) times daily.   . clonazePAM (KLONOPIN) 0.5 MG tablet Take 1 tablet (0.5 mg total) by mouth every 12 (twelve) hours as needed for up to 14 days for anxiety.  . clonazePAM (KLONOPIN) 1 MG tablet Take 1 tablet (1 mg total) by mouth at bedtime.  . clonazePAM (KLONOPIN) 1 MG tablet Take 1 mg by mouth daily.  . cyanocobalamin 1000 MCG tablet Take 1,000 mcg by mouth daily.  . cyclobenzaprine (FLEXERIL) 5 MG tablet Take 5 mg by mouth every 8 (eight) hours as needed (Back pain).  . furosemide (LASIX) 20 MG tablet Take 20 mg by mouth daily as needed (CHF).   Marland Kitchen gabapentin (NEURONTIN) 100 MG capsule Take 200 mg by mouth 3 (three) times daily. 2 capsules to = 200 mg = 8 AM, Noon, 4PM  . hydrocortisone cream 1 % Apply 1 application topically 4 (four) times daily as needed for itching. For Psoriasis  . magnesium hydroxide (MILK OF MAGNESIA) 400 MG/5ML suspension Take 30 mLs by mouth every 4 (four) hours as needed for mild constipation.  . Melatonin 3 MG TABS Take 1 tablet by mouth at bedtime.  . midodrine (PROAMATINE) 2.5 MG tablet Take 2.5 mg by mouth 3 (three) times daily with meals. Take along with sinemet  . nitrofurantoin, macrocrystal-monohydrate, (MACROBID) 100 MG capsule Take 100 mg by mouth 2 (two) times daily. For 14 days for UTI  . NON FORMULARY Diet type:  Regular  . Nutritional Supplements (NUTRITIONAL SUPPLEMENT PO) Take 1 each by mouth 3 (three) times daily with meals. Magic Cup  . polyethylene glycol (MIRALAX / GLYCOLAX) packet Take 17 g by mouth 2 (two) times daily. Mix with 4-6 ounces of fluid  . predniSONE (DELTASONE) 5 MG tablet Take 5 mg by mouth daily.   . Probiotic Product (RISA-BID PROBIOTIC) TABS Take 1 tablet by mouth 2 (two) times daily.  . sennosides-docusate sodium (SENOKOT-S) 8.6-50 MG tablet Take 2 tablets by mouth  3 (three) times daily.   . sertraline (ZOLOFT) 100 MG tablet Take 150 mg by mouth daily. Give 1-1/2 tablets to = 150 mg  . Skin Protectants, Misc. (ENDIT EX) Apply liberal amount topically to areas of skin irritation 2 times daily and prn. Ok to leave at bedside.  . sorbitol 70 % solution Take 30 mLs by mouth every 2 (two) hours as needed (constipation).   . triamcinolone cream (KENALOG) 0.1 % Apply 1 application topically daily as needed. Apply liberal amount topically as needed to areas of psoriasis    No facility-administered encounter medications on file as of 06/03/2019.     Review of Systems  GENERAL: +poor appetite MOUTH and THROAT: Denies oral discomfort, gingival pain or bleeding, pain from teeth or hoarseness  RESPIRATORY: no cough, SOB, DOE, wheezing, hemoptysis CARDIAC: No chest pain, edema or palpitations GI: No abdominal pain, diarrhea, constipation, heart burn, nausea or vomiting GU: Denies dysuria, frequency, hematuria, incontinence, or discharge NEUROLOGICAL: Denies dizziness, syncope, numbness, or headache PSYCHIATRIC: Denies feelings of depression or anxiety. No report of hallucinations, insomnia, paranoia, or agitation   Immunization History  Administered Date(s) Administered  . Influenza, High Dose Seasonal PF 09/22/2017  . Influenza-Unspecified 10/09/2012, 09/28/2016, 10/05/2018  . PPD Test 10/12/2016, 09/29/2017, 09/29/2018  . Pneumococcal Conjugate-13 02/12/2019  . Pneumococcal-Unspecified 05/13/2014   Pertinent  Health Maintenance Due  Topic Date Due  . INFLUENZA VACCINE  07/31/2019  . PNA vac Low Risk Adult  Completed     Vitals:   06/03/19 1008  BP: 139/61  Pulse: 61  Resp: 18  Temp: 98.2 F (36.8 C)  TempSrc: Oral  SpO2: 96%  Weight: 135 lb (61.2 kg)  Height: 5\' 8"  (1.727 m)   Body mass index is 20.53 kg/m.  Physical Exam  GENERAL APPEARANCE:  In no acute distress. Normal body habitus SKIN:  Skin is warm and dry.  MOUTH and THROAT:  Lips are without lesions. Oral mucosa is moist and without lesions. Tongue is normal in shape, size, and color and without lesions RESPIRATORY: Breathing is even & unlabored, BS CTAB CARDIAC: RRR, no murmur,no extra heart sounds, no edema GI: Abdomen soft, normal BS, no masses, no tenderness EXTREMITIES:  Able to move X 4 extremities NEUROLOGICAL: Speech is clear. Alert to self, disoriented to time and place. PSYCHIATRIC:  Affect and behavior are appropriate  Labs reviewed: Recent Labs    03/02/19 0600 03/08/19 1645 05/25/19 1938  NA 140 139 138  K 3.3* 3.8 3.9  CL 108 105 107  CO2 24 24 22   GLUCOSE 80 109* 118*  BUN 16 20 19   CREATININE 0.83 0.99 1.04  CALCIUM 8.5* 8.6* 8.3*   Recent Labs    10/14/18 0600 05/25/19 1938  AST 19 24  ALT 7 6  ALKPHOS 88 108  BILITOT 0.4 0.4  PROT 6.4* 6.6  ALBUMIN 3.9 3.9   Recent Labs    08/04/18 0600 01/06/19 0630 05/25/19 1938  WBC 5.2 5.0 6.0  NEUTROABS 2.9 2.9 4.8  HGB 11.3* 9.9* 10.8*  HCT 33.1* 31.2* 33.3*  MCV 100.6* 102.3* 96.5  PLT 233 196 189   Lab Results  Component Value Date   TSH 3.787 12/09/2017   No results found for: HGBA1C Lab Results  Component Value Date   CHOL 136 11/29/2015   HDL 44 11/29/2015   LDLCALC 80 11/29/2015   TRIG 59 11/29/2015   CHOLHDL 3.1 11/29/2015    Significant Diagnostic Results in last 30 days:  Ct Head Wo Contrast  Result Date: 05/25/2019 CLINICAL DATA:  Recent fall with headaches and neck pain, initial encounter EXAM: CT HEAD WITHOUT CONTRAST CT CERVICAL SPINE WITHOUT CONTRAST TECHNIQUE: Multidetector CT imaging of the head and cervical spine was performed following the standard protocol without intravenous contrast. Multiplanar CT image reconstructions of the cervical spine were also generated. COMPARISON:  01/27/2016 FINDINGS: CT HEAD FINDINGS Brain: Chronic atrophic and ischemic changes are noted. No findings to suggest acute hemorrhage, acute infarction or space-occupying mass  lesion are noted. Vascular: No hyperdense vessel or unexpected calcification. Skull: Normal. Negative for fracture or focal lesion. Sinuses/Orbits: No acute finding. Other: None. CT CERVICAL SPINE FINDINGS Alignment: Within normal limits. Skull base and vertebrae: 7 cervical segments are well visualized. Vertebral body height is well maintained. Degenerative  anterolisthesis of C7 on T1 is noted. Disc space narrowing is noted from C3-C7 with associated osteophytic changes. No acute fracture or acute facet abnormality is noted. Soft tissues and spinal canal: Surrounding soft tissues are within normal limits. Upper chest: Visualized lung apices are unremarkable. Other: None IMPRESSION: CT of the head: Chronic atrophic and ischemic changes. CT of the cervical spine: Multilevel degenerative change without acute bony abnormality. Degenerative anterolisthesis of C7 on T1 is noted. Electronically Signed   By: Inez Catalina M.D.   On: 05/25/2019 19:21   Ct Cervical Spine Wo Contrast  Result Date: 05/25/2019 CLINICAL DATA:  Recent fall with headaches and neck pain, initial encounter EXAM: CT HEAD WITHOUT CONTRAST CT CERVICAL SPINE WITHOUT CONTRAST TECHNIQUE: Multidetector CT imaging of the head and cervical spine was performed following the standard protocol without intravenous contrast. Multiplanar CT image reconstructions of the cervical spine were also generated. COMPARISON:  01/27/2016 FINDINGS: CT HEAD FINDINGS Brain: Chronic atrophic and ischemic changes are noted. No findings to suggest acute hemorrhage, acute infarction or space-occupying mass lesion are noted. Vascular: No hyperdense vessel or unexpected calcification. Skull: Normal. Negative for fracture or focal lesion. Sinuses/Orbits: No acute finding. Other: None. CT CERVICAL SPINE FINDINGS Alignment: Within normal limits. Skull base and vertebrae: 7 cervical segments are well visualized. Vertebral body height is well maintained. Degenerative anterolisthesis  of C7 on T1 is noted. Disc space narrowing is noted from C3-C7 with associated osteophytic changes. No acute fracture or acute facet abnormality is noted. Soft tissues and spinal canal: Surrounding soft tissues are within normal limits. Upper chest: Visualized lung apices are unremarkable. Other: None IMPRESSION: CT of the head: Chronic atrophic and ischemic changes. CT of the cervical spine: Multilevel degenerative change without acute bony abnormality. Degenerative anterolisthesis of C7 on T1 is noted. Electronically Signed   By: Inez Catalina M.D.   On: 05/25/2019 19:21    Assessment/Plan  1. Urinary tract infection without hematuria, site unspecified - has >100,000 CFU/ml Enterococcus faecalis, will continue Macrobid  2. Parkinson's disease ([place]) -Continue Sinemet 25-250 mg 1 tab 3 times a day, Sinemet 50-200 mg 1 tab at bedtime  3. Chronic systolic congestive heart failure (HCC) -No SOB, continue Lasix 20 mg 1 tab daily as needed  4. Idiopathic thrombocytopenia purpura (HCC) Lab Results  Component Value Date   PLT 189 05/25/2019  -Continue prednisone 5 mg 1 tab daily   5. Polyneuropathy -Stable, continue gabapentin 100 mg give 2 capsules = 200 mg 3 times a day  6. Malignant neoplasm of prostate ([place]) -Denies urinary retention, no hematuria, continue alfuzosin ER 10 mg 1 tab daily  7. Failure to thrive in adult -Followed up by palliative care, continue Magic cup with meals 3 times daily  8. Mild episode of recurrent major depressive disorder (HCC) -Stable, continue bupropion 75 mg 1 tab daily, sertraline 100 mg  1 1/2 tab = 150 mg daily  9. Anxiety - had been having episodes of anxiety and Clonazepam PRN has been increased to 0.5 mg Q 12 hours PRN, clonazepam 1 mg 1 tab at 4 PM and 0.5 mg at bedtime  10. Late onset Alzheimer's disease with behavioral disturbance ([place]) -Continue supportive care, fall precautions    Family/ staff Communication: Discussed plan of care with  resident.  Labs/tests ordered:  None  Goals of care:   Long-term care/palliative care   Durenda Age, DNP, FNP-BC [facility] [phone] (Monday-Friday 8:00 a.m. - 5:00 p.m.) [phone] (after hours)

## 2019-06-17 ENCOUNTER — Other Ambulatory Visit: Payer: Self-pay | Admitting: Adult Health

## 2019-06-17 MED ORDER — CLONAZEPAM 1 MG PO TABS: 1.0000 mg | ORAL_TABLET | Freq: Every day | ORAL | 0 refills | Status: AC

## 2019-06-17 MED ORDER — CLONAZEPAM 0.5 MG PO TABS: 0.5000 mg | ORAL_TABLET | Freq: Every day | ORAL | 0 refills | Status: AC

## 2019-06-17 NOTE — Telephone Encounter (Signed)
Clonazepam refilled, pended, and forwarded to NP for approval and transmittal to [facility].

## 2019-06-30 ENCOUNTER — Encounter
Admission: RE | Admit: 2019-06-30 | Discharge: 2019-06-30 | Disposition: A | Discharge status: Home / Self Care | Payer: Medicare Other | Source: Ambulatory Visit | Attending: Internal Medicine | Admitting: Internal Medicine

## 2019-07-01 ENCOUNTER — Other Ambulatory Visit
Admission: RE | Admit: 2019-07-01 | Discharge: 2019-07-01 | Disposition: A | Discharge status: Home / Self Care | Payer: Medicare Other | Source: Ambulatory Visit | Attending: Internal Medicine | Admitting: Internal Medicine

## 2019-07-01 DIAGNOSIS — R410 Disorientation, unspecified: Secondary | ICD-10-CM | POA: Insufficient documentation

## 2019-07-01 DIAGNOSIS — R443 Hallucinations, unspecified: Secondary | ICD-10-CM | POA: Diagnosis not present

## 2019-07-01 LAB — URINALYSIS, ROUTINE W REFLEX MICROSCOPIC
Bacteria, UA: NONE SEEN
Bilirubin Urine: NEGATIVE
Glucose, UA: NEGATIVE mg/dL
Ketones, ur: NEGATIVE mg/dL
Nitrite: NEGATIVE
Protein, ur: NEGATIVE mg/dL
Specific Gravity, Urine: 1.011 (ref 1.005–1.030)
Squamous Epithelial / HPF: NONE SEEN (ref 0–5)
pH: 7 (ref 5.0–8.0)

## 2019-07-03 LAB — URINE CULTURE: Culture: 20000 — AB

## 2019-08-02 ENCOUNTER — Encounter
Admission: RE | Admit: 2019-08-02 | Discharge: 2019-08-02 | Disposition: A | Discharge status: Home / Self Care | Payer: Medicare Other | Source: Ambulatory Visit | Attending: Internal Medicine | Admitting: Internal Medicine

## 2019-08-16 DIAGNOSIS — L03116 Cellulitis of left lower limb: Secondary | ICD-10-CM | POA: Insufficient documentation

## 2019-08-31 ENCOUNTER — Encounter: Admission: RE | Admit: 2019-08-31 | Payer: Medicare Other | Source: Ambulatory Visit | Admitting: Internal Medicine

## 2019-08-31 ENCOUNTER — Encounter
Admission: RE | Admit: 2019-08-31 | Discharge: 2019-08-31 | Disposition: A | Discharge status: Home / Self Care | Payer: Medicare Other | Source: Ambulatory Visit | Attending: Internal Medicine | Admitting: Internal Medicine

## 2019-09-30 ENCOUNTER — Telehealth: Payer: Self-pay | Admitting: Adult Health Nurse Practitioner

## 2019-09-30 ENCOUNTER — Encounter
Admission: RE | Admit: 2019-09-30 | Discharge: 2019-09-30 | Disposition: A | Discharge status: Home / Self Care | Payer: Medicare Other | Source: Ambulatory Visit | Attending: Internal Medicine | Admitting: Internal Medicine

## 2019-09-30 NOTE — Telephone Encounter (Signed)
Spoke with DON and SW at facility about patient's current status warranting further palliative services.  Patient is physically stable with no recent reported declines. Does have a lot of agitation associated with sundowning. Will evaluate prior to any changes in palliative services. [name] [name] K. Olena Heckle NP

## 2019-10-14 ENCOUNTER — Other Ambulatory Visit: Payer: Self-pay | Admitting: Urology

## 2019-10-27 ENCOUNTER — Ambulatory Visit (INDEPENDENT_AMBULATORY_CARE_PROVIDER_SITE_OTHER): Payer: Medicare Other | Admitting: Urology

## 2019-10-27 ENCOUNTER — Encounter: Payer: Self-pay | Admitting: Urology

## 2019-10-27 DIAGNOSIS — R3129 Other microscopic hematuria: Secondary | ICD-10-CM

## 2019-10-27 NOTE — Progress Notes (Signed)
History of UTI and urinalysis appears infected today, will send for culture and send in abx if needed, will re-schedule cysto for history of bladder cancer in ~3 weeks  [name], MD 10/28/2019

## 2019-10-28 LAB — URINALYSIS, COMPLETE
Bilirubin, UA: NEGATIVE
Glucose, UA: NEGATIVE
Ketones, UA: NEGATIVE
Nitrite, UA: NEGATIVE
Protein,UA: NEGATIVE
Specific Gravity, UA: 1.02 (ref 1.005–1.030)
Urobilinogen, Ur: 0.2 mg/dL (ref 0.2–1.0)
pH, UA: 6 (ref 5.0–7.5)

## 2019-10-28 LAB — MICROSCOPIC EXAMINATION: WBC, UA: >30 /hpf — AB (ref 0–5)

## 2019-10-30 LAB — CULTURE, URINE COMPREHENSIVE

## 2019-11-01 ENCOUNTER — Other Ambulatory Visit: Payer: Self-pay

## 2019-11-01 NOTE — Telephone Encounter (Signed)
-----   Message from Billey Co, MD sent at 11/01/2019  9:50 AM EST ----- This is the cysto we canceled the other day for UTI. Please start him on cipro 500mg  BID x 14 days. He should already have cysto rescheduled for about 2-3 weeks from now, thanks  [name], MD 11/01/2019

## 2019-11-01 NOTE — Telephone Encounter (Signed)
Called patient and left message to call no DPR on file No pharmacy on file unable to send in abx until pharmacy is obtained

## 2019-11-02 DIAGNOSIS — N3000 Acute cystitis without hematuria: Secondary | ICD-10-CM | POA: Insufficient documentation

## 2019-11-02 NOTE — Telephone Encounter (Signed)
Left mess to call, called patient's emergency contact left voicemail to have patient contact our office. NO DPR on file cannot release information

## 2019-11-02 NOTE — Telephone Encounter (Signed)
[facility] was contacted [employer] they are currently Mr. Stotz caregivers  Candace states that they have access to Epic and saw the results and orders and patient will start medication today

## 2019-11-02 NOTE — Telephone Encounter (Signed)
Left mess to call 

## 2019-11-03 NOTE — Telephone Encounter (Signed)
Bobbie from [facility] returned [employer].  I read message.

## 2019-11-05 ENCOUNTER — Non-Acute Institutional Stay: Payer: Medicare Other | Admitting: Adult Health Nurse Practitioner

## 2019-11-05 DIAGNOSIS — Z515 Encounter for palliative care: Secondary | ICD-10-CM

## 2019-11-06 NOTE — Progress Notes (Signed)
[facility] Consult Note Telephone: [phone]  Fax: [phone]  PATIENT NAME: [name] [name] DOB: 1930-02-25 MRN: [id number]  PRIMARY CARE PROVIDER:   Kirk Ruths, MD  REFERRING PROVIDER:  Kirk Ruths, MD [address] [place],  [place] [address]  RESPONSIBLE PARTY:   Daughter, [name] [name]  [phone]    RECOMMENDATIONS and PLAN:  1.  Advanced care planning.  Patient is a DNR.  No changes made today  2.  Dementia.  Patient is wheelchair bound.  Dependent for ADLs.  Able to feed himself.  Incontinent of B&B.  Patient is easily distracted and does not answer questions as he starts talking about other things.  Though able to understand his speech not always able to follow what he is talking about.  Staff reports no concerns today.  He has a good appetite and his weight has been stable around 125-128.  He did have a fall and was treated for UTI in May of this year.  Staff does report that he had a fall about a month ago but not recently.  Patient is stable and palliative will continue to monitor for symptom management and/or decline and make recommendations as needed.  Continue supportive care.    I spent 30 minutes providing this consultation,  from 1:45 to 2:15. More than 50% of the time in this consultation was spent coordinating communication.   HISTORY OF PRESENT ILLNESS:  [name] [name] is a 85 y.o. year old male with multiple medical problems including Parkinson's disease, CHF, atrial fibrillation,anemia, arthritis, prostate cancer s/p surgery, peripheral neuropathy, spinal stenosis s/p lumbar laminectomy, psoriasis, history of upper GI bleed, history of nephrolithiasis, cataract extraction, depression, anxiety. Palliative Care was asked to help address goals of care.   CODE STATUS: DNR  PPS: 30% HOSPICE ELIGIBILITY/DIAGNOSIS: TBD  PHYSICAL EXAM:   General: NAD, frail appearing,  thin Cardiovascular: regular rate and rhythm Pulmonary: lung sounds clear; normal respiratory effort Abdomen: soft, nontender, + bowel sounds GU: no suprapubic tenderness Extremities: no edema,wearing TED hose; no joint deformities Skin: no rashes Neurological: Weakness; A&O to person and place  PAST MEDICAL HISTORY:  Past Medical History:  Diagnosis Date  . Alzheimer's disease ([place]) 11/29/2015  . Anemia, unspecified    pernicious  . Anxiety    unspecified  . Arthritis   . Atrial fibrillation ([place])   . CHF (congestive heart failure) ([place]) 01/2006  . History of kidney stones   . Hyperlipidemia   . Nephrolithiasis   . Parkinson's disease ([place])   . Peripheral neuropathy   . Prostate cancer ([place]) 11/2006  . Psoriasis   . Skin cancer   . Sleep disturbance    REM  . Spinal stenosis    s/p lumbar laminectomy  . Upper GI bleed    unspecified; history of AVMs  . Valvular heart disease   . Vertigo     SOCIAL HX:  Social History   Tobacco Use  . Smoking status: Former Smoker    Types: Cigarettes    Quit date: 03/30/1986    Years since quitting: 33.6  . Smokeless tobacco: Never Used  Substance Use Topics  . Alcohol use: No    ALLERGIES:  Allergies  Allergen Reactions  . Exelon [Rivastigmine Tartrate]     Increased agitation/irritability  . Other     Strawberries  . Penicillins      PERTINENT MEDICATIONS:  Outpatient Encounter Medications as of 11/05/2019  Medication  Sig  . acetaminophen (TYLENOL) 325 MG tablet Take 650 mg by mouth every 4 (four) hours as needed. for pain/ increased temp. May be administered orally, per G-tube if needed or rectally if unable to swallow (separate order). Maximum of 3000 mg Acetaminophen in 24 hours.  Marland Kitchen acetaminophen (TYLENOL) 500 MG tablet Take 500 mg by mouth 2 (two) times daily. Maximum of 3000 mg of acetaminophen in 24 hours. Check prn acetaminophen order prior to administration due to limit.  Marland Kitchen alfuzosin (UROXATRAL) 10 MG 24 hr  tablet Take 10 mg by mouth every evening.   Marland Kitchen buPROPion (WELLBUTRIN) 75 MG tablet Take 75 mg by mouth daily.  . carbidopa-levodopa (SINEMET CR) 50-200 MG tablet Take 1 tablet by mouth at bedtime.   . carbidopa-levodopa (SINEMET IR) 25-250 MG tablet Take 1 tablet by mouth 3 (three) times daily.   . clonazePAM (KLONOPIN) 1 MG tablet Take 1 mg by mouth daily.  . clonazePAM (KLONOPIN) 1 MG tablet Take 1 tablet (1 mg total) by mouth daily. Take at 4PM  . cyanocobalamin 1000 MCG tablet Take 1,000 mcg by mouth daily.  . cyclobenzaprine (FLEXERIL) 5 MG tablet Take 5 mg by mouth every 8 (eight) hours as needed (Back pain).  . furosemide (LASIX) 20 MG tablet Take 20 mg by mouth daily as needed (CHF).   Marland Kitchen gabapentin (NEURONTIN) 100 MG capsule Take 200 mg by mouth 3 (three) times daily. 2 capsules to = 200 mg = 8 AM, Noon, 4PM  . hydrocortisone cream 1 % Apply 1 application topically 4 (four) times daily as needed for itching. For Psoriasis  . magnesium hydroxide (MILK OF MAGNESIA) 400 MG/5ML suspension Take 30 mLs by mouth every 4 (four) hours as needed for mild constipation.  . Melatonin 3 MG TABS Take 1 tablet by mouth at bedtime.  . midodrine (PROAMATINE) 2.5 MG tablet Take 2.5 mg by mouth 3 (three) times daily with meals. Take along with sinemet  . NON FORMULARY Diet type:  Regular  . Nutritional Supplements (NUTRITIONAL SUPPLEMENT PO) Take 1 each by mouth 3 (three) times daily with meals. Magic Cup  . polyethylene glycol (MIRALAX / GLYCOLAX) packet Take 17 g by mouth 2 (two) times daily. Mix with 4-6 ounces of fluid  . predniSONE (DELTASONE) 5 MG tablet Take 5 mg by mouth daily.   . Probiotic Product (RISA-BID PROBIOTIC) TABS Take 1 tablet by mouth 2 (two) times daily.  . sennosides-docusate sodium (SENOKOT-S) 8.6-50 MG tablet Take 2 tablets by mouth 3 (three) times daily.   . sertraline (ZOLOFT) 100 MG tablet Take 150 mg by mouth daily. Give 1-1/2 tablets to = 150 mg  . Skin Protectants, Misc. (ENDIT  EX) Apply liberal amount topically to areas of skin irritation 2 times daily and prn. Ok to leave at bedside.  . sorbitol 70 % solution Take 30 mLs by mouth every 2 (two) hours as needed (constipation).   . triamcinolone cream (KENALOG) 0.1 % Apply 1 application topically daily as needed. Apply liberal amount topically as needed to areas of psoriasis    No facility-administered encounter medications on file as of 11/05/2019.       Nikoleta Dady Jenetta Downer, NP

## 2019-11-08 ENCOUNTER — Other Ambulatory Visit: Payer: Self-pay

## 2019-11-08 ENCOUNTER — Encounter
Admission: RE | Admit: 2019-11-08 | Discharge: 2019-11-08 | Disposition: A | Discharge status: Home / Self Care | Payer: Medicare Other | Source: Ambulatory Visit | Attending: Internal Medicine | Admitting: Internal Medicine

## 2019-11-22 ENCOUNTER — Other Ambulatory Visit: Payer: Self-pay

## 2019-11-22 ENCOUNTER — Encounter: Payer: Self-pay | Admitting: Urology

## 2019-11-22 ENCOUNTER — Ambulatory Visit (INDEPENDENT_AMBULATORY_CARE_PROVIDER_SITE_OTHER): Payer: Medicare Other | Admitting: Urology

## 2019-11-22 VITALS — BP 85/45 | HR 87

## 2019-11-22 DIAGNOSIS — D494 Neoplasm of unspecified behavior of bladder: Secondary | ICD-10-CM | POA: Diagnosis not present

## 2019-11-22 LAB — URINALYSIS, COMPLETE
Bilirubin, UA: NEGATIVE
Glucose, UA: NEGATIVE
Ketones, UA: NEGATIVE
Nitrite, UA: NEGATIVE
Specific Gravity, UA: 1.025 (ref 1.005–1.030)
Urobilinogen, Ur: 0.2 mg/dL (ref 0.2–1.0)
pH, UA: 6 (ref 5.0–7.5)

## 2019-11-22 LAB — MICROSCOPIC EXAMINATION
Bacteria, UA: NONE SEEN
RBC, Urine: >30 /hpf — AB (ref 0–2)

## 2019-11-22 MED ORDER — LIDOCAINE HCL URETHRAL/MUCOSAL 2 % EX GEL: 1.0000 "application " | Freq: Once | CUTANEOUS | Status: AC

## 2019-11-22 NOTE — Progress Notes (Signed)
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 Cystoscopy was performed with a flexible cystoscope. The urethra, bladder neck and entire bladder was visualized in a standard fashion. The prostate was small and fibrotic consistent with prior radiation.  On retroflexion, small debris at base of bladder that irrigated free with fluid. No mucosal lesions.  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  No recurrence on 5-month cystoscopy today.  With his numerous co-morbidities and frail condition, we agreed to push his next surveillance cystoscopy to 1 year.   RTC 1 year for cystoscopy   [name] Madrid, MD 11/22/2019

## 2019-12-21 ENCOUNTER — Other Ambulatory Visit
Admission: RE | Admit: 2019-12-21 | Discharge: 2019-12-21 | Disposition: A | Discharge status: Home / Self Care | Payer: Medicare Other | Source: Ambulatory Visit | Attending: Internal Medicine | Admitting: Internal Medicine

## 2019-12-21 DIAGNOSIS — R410 Disorientation, unspecified: Secondary | ICD-10-CM | POA: Diagnosis not present

## 2019-12-21 DIAGNOSIS — R509 Fever, unspecified: Secondary | ICD-10-CM | POA: Diagnosis present

## 2019-12-21 LAB — URINALYSIS, ROUTINE W REFLEX MICROSCOPIC
Bilirubin Urine: NEGATIVE
Glucose, UA: NEGATIVE mg/dL
Ketones, ur: NEGATIVE mg/dL
Nitrite: NEGATIVE
Protein, ur: NEGATIVE mg/dL
Specific Gravity, Urine: 1.013 (ref 1.005–1.030)
WBC, UA: >50 WBC/hpf — ABNORMAL HIGH (ref 0–5)
pH: 6 (ref 5.0–8.0)

## 2019-12-22 ENCOUNTER — Other Ambulatory Visit
Admission: RE | Admit: 2019-12-22 | Discharge: 2019-12-22 | Disposition: A | Discharge status: Home / Self Care | Payer: Medicare Other | Source: Ambulatory Visit | Attending: Internal Medicine | Admitting: Internal Medicine

## 2019-12-22 DIAGNOSIS — R509 Fever, unspecified: Secondary | ICD-10-CM | POA: Diagnosis present

## 2019-12-22 DIAGNOSIS — R410 Disorientation, unspecified: Secondary | ICD-10-CM | POA: Insufficient documentation

## 2019-12-22 LAB — URINALYSIS, COMPLETE (UACMP) WITH MICROSCOPIC
Bilirubin Urine: NEGATIVE
Glucose, UA: NEGATIVE mg/dL
Ketones, ur: NEGATIVE mg/dL
Nitrite: NEGATIVE
Protein, ur: 30 mg/dL — AB
Specific Gravity, Urine: 1.014 (ref 1.005–1.030)
Squamous Epithelial / HPF: NONE SEEN (ref 0–5)
WBC, UA: >50 WBC/hpf — ABNORMAL HIGH (ref 0–5)
pH: 6 (ref 5.0–8.0)

## 2019-12-22 LAB — URINE CULTURE

## 2019-12-28 ENCOUNTER — Other Ambulatory Visit
Admission: RE | Admit: 2019-12-28 | Discharge: 2019-12-28 | Disposition: A | Discharge status: Home / Self Care | Payer: Medicare Other | Source: Ambulatory Visit | Attending: Internal Medicine | Admitting: Internal Medicine

## 2019-12-28 DIAGNOSIS — Z029 Encounter for administrative examinations, unspecified: Secondary | ICD-10-CM | POA: Insufficient documentation

## 2019-12-28 LAB — URINALYSIS, COMPLETE (UACMP) WITH MICROSCOPIC
Bilirubin Urine: NEGATIVE
Glucose, UA: NEGATIVE mg/dL
Ketones, ur: 5 mg/dL — AB
Nitrite: NEGATIVE
Protein, ur: 30 mg/dL — AB
Specific Gravity, Urine: 1.018 (ref 1.005–1.030)
Squamous Epithelial / HPF: NONE SEEN (ref 0–5)
WBC, UA: >50 WBC/hpf — ABNORMAL HIGH (ref 0–5)
pH: 5 (ref 5.0–8.0)

## 2019-12-30 ENCOUNTER — Encounter
Admission: RE | Admit: 2019-12-30 | Discharge: 2019-12-30 | Disposition: A | Discharge status: Home / Self Care | Payer: Medicare Other | Source: Ambulatory Visit | Attending: Internal Medicine | Admitting: Internal Medicine

## 2019-12-31 LAB — SUSCEPTIBILITY, AER + ANAEROB

## 2019-12-31 LAB — SUSCEPTIBILITY RESULT

## 2020-01-05 LAB — URINE CULTURE: Culture: >=100000 — AB

## 2020-01-06 LAB — SUSCEPTIBILITY RESULT

## 2020-01-06 LAB — URINE CULTURE: Culture: >=100000 — AB

## 2020-01-06 LAB — SUSCEPTIBILITY, AER + ANAEROB

## 2020-02-17 ENCOUNTER — Encounter
Admission: RE | Admit: 2020-02-17 | Discharge: 2020-02-17 | Disposition: A | Discharge status: Home / Self Care | Payer: Medicare Other | Source: Ambulatory Visit | Attending: Internal Medicine | Admitting: Internal Medicine

## 2020-02-23 ENCOUNTER — Other Ambulatory Visit
Admission: RE | Admit: 2020-02-23 | Discharge: 2020-02-23 | Disposition: A | Discharge status: Home / Self Care | Payer: Medicare Other | Source: Skilled Nursing Facility | Attending: Internal Medicine | Admitting: Internal Medicine

## 2020-02-23 DIAGNOSIS — R319 Hematuria, unspecified: Secondary | ICD-10-CM | POA: Insufficient documentation

## 2020-02-23 LAB — URINALYSIS, COMPLETE (UACMP) WITH MICROSCOPIC
Bacteria, UA: NONE SEEN
Bilirubin Urine: NEGATIVE
Glucose, UA: NEGATIVE mg/dL
Ketones, ur: NEGATIVE mg/dL
Nitrite: NEGATIVE
Protein, ur: NEGATIVE mg/dL
Specific Gravity, Urine: 1.009 (ref 1.005–1.030)
WBC, UA: >50 WBC/hpf — ABNORMAL HIGH (ref 0–5)
pH: 6 (ref 5.0–8.0)

## 2020-02-25 LAB — URINE CULTURE: Culture: >=100000 — AB

## 2020-02-29 ENCOUNTER — Other Ambulatory Visit: Payer: Self-pay

## 2020-03-24 ENCOUNTER — Non-Acute Institutional Stay: Payer: Medicare Other | Admitting: Adult Health Nurse Practitioner

## 2020-03-24 ENCOUNTER — Other Ambulatory Visit: Payer: Self-pay

## 2020-03-24 DIAGNOSIS — F0281 Dementia in other diseases classified elsewhere with behavioral disturbance: Secondary | ICD-10-CM

## 2020-03-24 DIAGNOSIS — G20 Parkinson's disease: Secondary | ICD-10-CM

## 2020-03-24 DIAGNOSIS — Z515 Encounter for palliative care: Secondary | ICD-10-CM

## 2020-03-24 NOTE — Progress Notes (Signed)
[facility] Consult Note Telephone: [phone]  Fax: [phone]  PATIENT NAME: [name] [name] DOB: 12/07/1930 MRN: [id number]  PRIMARY CARE PROVIDER:   Kirk Ruths, MD  REFERRING PROVIDER:  Kirk Ruths, MD [facility] [place],   [address]  RESPONSIBLE PARTY:   Daughter, Peter Congo [phone]    RECOMMENDATIONS and PLAN:  1.  Advanced care planning.  Patient is a DNR.  2.  Dementia.  Patient is wheelchair bound.  Dependent for ADLs.  Able to feed himself.  Incontinent of B&B.  Patient is easily distracted and does not answer what is being asked.  Staff reports no concerns today.  He has a good appetite and his weight has been stable.  Current weight 124.3.  Continue supportive care at the facility.  Staff has no new concerns. Patient overall stable. Denies increased SOB or cough, N/V/D, constipation.  No falls, infections, or hospital visits since last visit.  Palliative will continue to monitor for symptom management/decline and make recommendations as needed.  Follow up in 4-6 weeks.  I spent 30 minutes providing this consultation,  from 12:45 to 1:15 including time with patient/family, chart review, provider coordination, and documentation. More than 50% of the time in this consultation was spent coordinating communication.   HISTORY OF PRESENT ILLNESS:  [name] [name] is a 86 y.o. year old male with multiple medical problems including Parkinson's disease, CHF, atrial fibrillation,anemia, arthritis, prostate cancer s/p surgery, peripheral neuropathy, spinal stenosis s/p lumbar laminectomy, psoriasis, history of upper GI bleed, history of nephrolithiasis, cataract extraction, depression, anxiety. Palliative Care was asked to help address goals of care.   CODE STATUS: DNR  PPS: 30% HOSPICE ELIGIBILITY/DIAGNOSIS: TBD  PHYSICAL EXAM:   General: NAD, frail appearing,  thin Cardiovascular: regular rate and rhythm Pulmonary: lung sounds clear; normal respiratory effort Abdomen: soft, nontender, + bowel sounds GU: no suprapubic tenderness Extremities: no edema,wearing TED hose; no joint deformities Skin: no rashes Neurological: Weakness; A&O to person and place  PAST MEDICAL HISTORY:  Past Medical History:  Diagnosis Date  . Alzheimer's disease ([facility]) 11/29/2015  . Anemia, unspecified    pernicious  . Anxiety    unspecified  . Arthritis   . Atrial fibrillation ([facility])   . CHF (congestive heart failure) ([facility]) 01/2006  . History of kidney stones   . Hyperlipidemia   . Nephrolithiasis   . Parkinson's disease ([place])   . Peripheral neuropathy   . Prostate cancer ([facility]) 11/2006  . Psoriasis   . Skin cancer   . Sleep disturbance    REM  . Spinal stenosis    s/p lumbar laminectomy  . Upper GI bleed    unspecified; history of AVMs  . Valvular heart disease   . Vertigo     SOCIAL HX:  Social History   Tobacco Use  . Smoking status: Former Smoker    Types: Cigarettes    Quit date: 03/30/1986    Years since quitting: 34.0  . Smokeless tobacco: Never Used  Substance Use Topics  . Alcohol use: No    ALLERGIES:  Allergies  Allergen Reactions  . Exelon [Rivastigmine Tartrate]     Increased agitation/irritability  . Other     Strawberries  . Penicillins      PERTINENT MEDICATIONS:  Outpatient Encounter Medications as of 03/24/2020  Medication Sig  . acetaminophen (TYLENOL) 325 MG tablet Take 650 mg by mouth every 4 (four) hours as needed. for pain/  increased temp. May be administered orally, per G-tube if needed or rectally if unable to swallow (separate order). Maximum of 3000 mg Acetaminophen in 24 hours.  Marland Kitchen acetaminophen (TYLENOL) 500 MG tablet Take 500 mg by mouth 2 (two) times daily. Maximum of 3000 mg of acetaminophen in 24 hours. Check prn acetaminophen order prior to administration due to limit.  Marland Kitchen alfuzosin (UROXATRAL) 10 MG 24 hr  tablet Take 10 mg by mouth every evening.   Marland Kitchen buPROPion (WELLBUTRIN) 75 MG tablet Take 75 mg by mouth daily.  . carbidopa-levodopa (SINEMET CR) 50-200 MG tablet Take 1 tablet by mouth at bedtime.   . carbidopa-levodopa (SINEMET IR) 25-250 MG tablet Take 1 tablet by mouth 3 (three) times daily.   . ciprofloxacin (CIPRO) 500 MG tablet Take 500 mg by mouth 2 (two) times daily.  . clonazePAM (KLONOPIN) 0.5 MG tablet Take 1 tablet (0.5 mg total) by mouth at bedtime for 14 days.  . clonazePAM (KLONOPIN) 1 MG tablet Take 1 mg by mouth daily.  . clonazePAM (KLONOPIN) 1 MG tablet Take 1 tablet (1 mg total) by mouth daily. Take at 4PM  . cyanocobalamin 1000 MCG tablet Take 1,000 mcg by mouth daily.  . cyclobenzaprine (FLEXERIL) 5 MG tablet Take 5 mg by mouth every 8 (eight) hours as needed (Back pain).  . furosemide (LASIX) 20 MG tablet Take 20 mg by mouth daily as needed (CHF).   Marland Kitchen gabapentin (NEURONTIN) 100 MG capsule Take 200 mg by mouth 3 (three) times daily. 2 capsules to = 200 mg = 8 AM, Noon, 4PM  . hydrocortisone cream 1 % Apply 1 application topically 4 (four) times daily as needed for itching. For Psoriasis  . magnesium hydroxide (MILK OF MAGNESIA) 400 MG/5ML suspension Take 30 mLs by mouth every 4 (four) hours as needed for mild constipation.  . Melatonin 3 MG TABS Take 1 tablet by mouth at bedtime.  . midodrine (PROAMATINE) 2.5 MG tablet Take 2.5 mg by mouth 3 (three) times daily with meals. Take along with sinemet  . NON FORMULARY Diet type:  Regular  . Nutritional Supplements (NUTRITIONAL SUPPLEMENT PO) Take 1 each by mouth 3 (three) times daily with meals. Magic Cup  . polyethylene glycol (MIRALAX / GLYCOLAX) packet Take 17 g by mouth 2 (two) times daily. Mix with 4-6 ounces of fluid  . predniSONE (DELTASONE) 5 MG tablet Take 5 mg by mouth daily.   . Probiotic Product (RISA-BID PROBIOTIC) TABS Take 1 tablet by mouth 2 (two) times daily.  . sennosides-docusate sodium (SENOKOT-S) 8.6-50 MG  tablet Take 2 tablets by mouth 3 (three) times daily.   . sertraline (ZOLOFT) 100 MG tablet Take 150 mg by mouth daily. Give 1-1/2 tablets to = 150 mg  . Skin Protectants, Misc. (ENDIT EX) Apply liberal amount topically to areas of skin irritation 2 times daily and prn. Ok to leave at bedside.  . sorbitol 70 % solution Take 30 mLs by mouth every 2 (two) hours as needed (constipation).   . triamcinolone cream (KENALOG) 0.1 % Apply 1 application topically daily as needed. Apply liberal amount topically as needed to areas of psoriasis    Facility-Administered Encounter Medications as of 03/24/2020  Medication  . lidocaine (XYLOCAINE) 2 % jelly 1 application      Ramata Strothman Jenetta Downer, NP

## 2020-05-01 ENCOUNTER — Encounter
Admission: RE | Admit: 2020-05-01 | Discharge: 2020-05-01 | Disposition: A | Discharge status: Home / Self Care | Payer: Medicare Other | Source: Ambulatory Visit | Attending: Internal Medicine | Admitting: Internal Medicine

## 2020-05-05 ENCOUNTER — Non-Acute Institutional Stay: Payer: Medicare Other | Admitting: Adult Health Nurse Practitioner

## 2020-05-05 ENCOUNTER — Other Ambulatory Visit: Payer: Self-pay

## 2020-05-05 DIAGNOSIS — G20 Parkinson's disease: Secondary | ICD-10-CM

## 2020-05-05 DIAGNOSIS — Z515 Encounter for palliative care: Secondary | ICD-10-CM

## 2020-05-05 DIAGNOSIS — F0281 Dementia in other diseases classified elsewhere with behavioral disturbance: Secondary | ICD-10-CM

## 2020-05-05 NOTE — Progress Notes (Signed)
[facility] Consult Note Telephone: [phone]  Fax: [phone]  PATIENT NAME: [name] [name] DOB: August 10, 1930 MRN: [id number]  PRIMARY CARE PROVIDER:   Kirk Ruths, MD  REFERRING PROVIDER:  Kirk Ruths, MD [facility] [place],  [place] [address]  RESPONSIBLE PARTY:   Daughter, Peter Congo [phone]     RECOMMENDATIONS and PLAN:  1.  Advanced care planning. Patient is a DNR.  2.  Parkinson's/Dementia. Patient is wheelchair bound. Dependent for ADLs. Able to feed himself. Incontinent of B&B. Patient is easily distracted and does not answer what is being asked.  Patient is harder to arouse in the morning.  Most days he misses his first dose of Sinemet.  Consistently is only getting 2 doses of his sinemet out of 3 per day.   He Also gets a long acting sinemet at night.  Staff reports that after he gets his first dose of sinemet he does perk up and is more awake.  He has also been displaying more agitation and now has scheduled Klonopin 0.5 mg BID.  Have changed the times of his sinemet to 12p, 4p, and 9p to see if getting 3 daily doses will help.  Will evaluate in 2-3 weeks for effectiveness. His appetite is still good and his weight is stable.  Current weight is 123.5.  Continue supportive care at the facility.  Denies increased SOB or cough, N/V/D, constipation.  No falls, infections, or hospital visits since last visit.  Palliative will continue to monitor for symptom management/decline and make recommendations as needed.  Follow up in 2-3 weeks.  I spent 60 minutes providing this consultation,  from  11:35to 12:35 including time spent with patient/staff, chart review, provider coordination, documentation. More than 50% of the time in this consultation was spent coordinating communication.   HISTORY OF PRESENT ILLNESS:  [name] [name] is a 86 y.o. year old male with multiple medical  problems including Parkinson's disease, CHF, atrial fibrillation,anemia, arthritis, prostate cancer s/p surgery, peripheral neuropathy, spinal stenosis s/p lumbar laminectomy, psoriasis, history of upper GI bleed, history of nephrolithiasis, cataract extraction, depression, anxiety. Palliative Care was asked to help address goals of care.   CODE STATUS: DNR  PPS: 30% HOSPICE ELIGIBILITY/DIAGNOSIS: TBD  PHYSICAL EXAM:  HR 75  O2 90% on RA General: NAD, frail appearing, thin Cardiovascular: regular rate and rhythm Pulmonary:lung sounds clear; normal respiratory effort Abdomen: soft, nontender, + bowel sounds GU: no suprapubic tenderness Extremities: no edema,wearing TED hose;no joint deformities Skin: no rashes Neurological: Weakness; A&O to person and place  PAST MEDICAL HISTORY:  Past Medical History:  Diagnosis Date  . Alzheimer's disease ([facility]) 11/29/2015  . Anemia, unspecified    pernicious  . Anxiety    unspecified  . Arthritis   . Atrial fibrillation ([facility])   . CHF (congestive heart failure) ([facility]) 01/2006  . History of kidney stones   . Hyperlipidemia   . Nephrolithiasis   . Parkinson's disease ([facility])   . Peripheral neuropathy   . Prostate cancer ([facility]) 11/2006  . Psoriasis   . Skin cancer   . Sleep disturbance    REM  . Spinal stenosis    s/p lumbar laminectomy  . Upper GI bleed    unspecified; history of AVMs  . Valvular heart disease   . Vertigo     SOCIAL HX:  Social History   Tobacco Use  . Smoking status: Former Smoker    Types: Cigarettes  Quit date: 03/30/1986    Years since quitting: 34.1  . Smokeless tobacco: Never Used  Substance Use Topics  . Alcohol use: No    ALLERGIES:  Allergies  Allergen Reactions  . Exelon [Rivastigmine Tartrate]     Increased agitation/irritability  . Other     Strawberries  . Penicillins      PERTINENT MEDICATIONS:  Outpatient Encounter Medications as of 05/05/2020  Medication Sig  . acetaminophen (TYLENOL)  325 MG tablet Take 650 mg by mouth every 4 (four) hours as needed. for pain/ increased temp. May be administered orally, per G-tube if needed or rectally if unable to swallow (separate order). Maximum of 3000 mg Acetaminophen in 24 hours.  Marland Kitchen acetaminophen (TYLENOL) 500 MG tablet Take 500 mg by mouth 2 (two) times daily. Maximum of 3000 mg of acetaminophen in 24 hours. Check prn acetaminophen order prior to administration due to limit.  Marland Kitchen alfuzosin (UROXATRAL) 10 MG 24 hr tablet Take 10 mg by mouth every evening.   Marland Kitchen buPROPion (WELLBUTRIN) 75 MG tablet Take 75 mg by mouth daily.  . carbidopa-levodopa (SINEMET CR) 50-200 MG tablet Take 1 tablet by mouth at bedtime.   . carbidopa-levodopa (SINEMET IR) 25-250 MG tablet Take 1 tablet by mouth 3 (three) times daily.   . ciprofloxacin (CIPRO) 500 MG tablet Take 500 mg by mouth 2 (two) times daily.  . clonazePAM (KLONOPIN) 0.5 MG tablet Take 1 tablet (0.5 mg total) by mouth at bedtime for 14 days.  . clonazePAM (KLONOPIN) 1 MG tablet Take 1 mg by mouth daily.  . clonazePAM (KLONOPIN) 1 MG tablet Take 1 tablet (1 mg total) by mouth daily. Take at 4PM  . cyanocobalamin 1000 MCG tablet Take 1,000 mcg by mouth daily.  . cyclobenzaprine (FLEXERIL) 5 MG tablet Take 5 mg by mouth every 8 (eight) hours as needed (Back pain).  . furosemide (LASIX) 20 MG tablet Take 20 mg by mouth daily as needed (CHF).   Marland Kitchen gabapentin (NEURONTIN) 100 MG capsule Take 200 mg by mouth 3 (three) times daily. 2 capsules to = 200 mg = 8 AM, Noon, 4PM  . hydrocortisone cream 1 % Apply 1 application topically 4 (four) times daily as needed for itching. For Psoriasis  . magnesium hydroxide (MILK OF MAGNESIA) 400 MG/5ML suspension Take 30 mLs by mouth every 4 (four) hours as needed for mild constipation.  . Melatonin 3 MG TABS Take 1 tablet by mouth at bedtime.  . midodrine (PROAMATINE) 2.5 MG tablet Take 2.5 mg by mouth 3 (three) times daily with meals. Take along with sinemet  . NON  FORMULARY Diet type:  Regular  . Nutritional Supplements (NUTRITIONAL SUPPLEMENT PO) Take 1 each by mouth 3 (three) times daily with meals. Magic Cup  . polyethylene glycol (MIRALAX / GLYCOLAX) packet Take 17 g by mouth 2 (two) times daily. Mix with 4-6 ounces of fluid  . predniSONE (DELTASONE) 5 MG tablet Take 5 mg by mouth daily.   . Probiotic Product (RISA-BID PROBIOTIC) TABS Take 1 tablet by mouth 2 (two) times daily.  . sennosides-docusate sodium (SENOKOT-S) 8.6-50 MG tablet Take 2 tablets by mouth 3 (three) times daily.   . sertraline (ZOLOFT) 100 MG tablet Take 150 mg by mouth daily. Give 1-1/2 tablets to = 150 mg  . Skin Protectants, Misc. (ENDIT EX) Apply liberal amount topically to areas of skin irritation 2 times daily and prn. Ok to leave at bedside.  . sorbitol 70 % solution Take 30 mLs by mouth every  2 (two) hours as needed (constipation).   . triamcinolone cream (KENALOG) 0.1 % Apply 1 application topically daily as needed. Apply liberal amount topically as needed to areas of psoriasis    Facility-Administered Encounter Medications as of 05/05/2020  Medication  . lidocaine (XYLOCAINE) 2 % jelly 1 application    PHYSICAL EXAM:   General: NAD, frail appearing, thin Cardiovascular: regular rate and rhythm Pulmonary: clear ant fields Abdomen: soft, nontender, + bowel sounds GU: no suprapubic tenderness Extremities: no edema, no joint deformities Skin: no rashes Neurological: Weakness but otherwise nonfocal  Dee Paden Jenetta Downer, NP

## 2020-06-05 ENCOUNTER — Other Ambulatory Visit
Admission: RE | Admit: 2020-06-05 | Discharge: 2020-06-05 | Disposition: A | Discharge status: Home / Self Care | Payer: Medicare Other | Source: Skilled Nursing Facility | Attending: Internal Medicine | Admitting: Internal Medicine

## 2020-06-14 ENCOUNTER — Encounter
Admission: RE | Admit: 2020-06-14 | Discharge: 2020-06-14 | Disposition: A | Discharge status: Home / Self Care | Payer: Medicare Other | Source: Ambulatory Visit | Attending: Internal Medicine | Admitting: Internal Medicine

## 2020-07-28 ENCOUNTER — Non-Acute Institutional Stay: Payer: Medicare Other | Admitting: Adult Health Nurse Practitioner

## 2020-07-28 ENCOUNTER — Other Ambulatory Visit: Payer: Self-pay

## 2020-07-28 DIAGNOSIS — F02818 Dementia in other diseases classified elsewhere, unspecified severity, with other behavioral disturbance: Secondary | ICD-10-CM

## 2020-07-28 DIAGNOSIS — Z515 Encounter for palliative care: Secondary | ICD-10-CM

## 2020-07-28 DIAGNOSIS — F0281 Dementia in other diseases classified elsewhere with behavioral disturbance: Secondary | ICD-10-CM

## 2020-07-28 NOTE — Progress Notes (Signed)
[facility] Consult Note Telephone: [phone]  Fax: [phone]  PATIENT NAME: [name] [name] DOB: 01-03-1930 MRN: [id number]  PRIMARY CARE PROVIDER:   Kirk Ruths, MD  REFERRING PROVIDER:  Kirk Ruths, MD [facility] [place],  [place] [address]  RESPONSIBLE PARTY:   Daughter, [name] [name] [phone]     RECOMMENDATIONS and PLAN:  1.  Advanced care planning. Patient is a DNR.  Called daughter to update on visit.  Left VM with reason for call and call back info  2.  Parkinson's/Dementia. Patient is wheelchair bound. Dependent for ADLs. Able to feed himself. Incontinent of B&B. Patient is easily distractedand does not answer what is being asked.  He is doing well on current regimen of Sinemet with no tremors noted today. He is eating well and weight stable at 118-119 pounds. At night sometimes he tries to get up without assistance.  Did have a fall on 07/07/20 with no injury. Continue supportive care at the facility  Denies increased SOB or cough, N/V/D, constipation. No infections or hospital visits since last visit. Palliative will continue to monitor for symptom management/decline and make recommendations as needed. Follow up in 4-6 weeks  I spent 30 minutes providing this consultation,  from 9:30 to 10:00 including time spent with patient/staff, chart review, provider coordination, documentation. More than 50% of the time in this consultation was spent coordinating communication.   HISTORY OF PRESENT ILLNESS:  [name] [name] is a 86 y.o. year old male with multiple medical problems including Parkinson's disease, CHF, atrial fibrillation,anemia, arthritis, prostate cancer s/p surgery, peripheral neuropathy, spinal stenosis s/p lumbar laminectomy, psoriasis, history of upper GI bleed, history of nephrolithiasis, cataract extraction, depression, anxiety. Palliative Care was asked to  help address goals of care.   CODE STATUS: DNR  PPS: 30% HOSPICE ELIGIBILITY/DIAGNOSIS: TBD  PHYSICAL EXAM:  HR 62  O2 100% on RA General: NAD, frail appearing, thin Cardiovascular: regular rate and rhythm Pulmonary:lung sounds clear; normal respiratory effort Abdomen: soft, nontender, + bowel sounds GU: no suprapubic tenderness Extremities: no edema,wearing TED hose;no joint deformities Skin: no rashes Neurological: Weakness; A&O to person and place  PAST MEDICAL HISTORY:  Past Medical History:  Diagnosis Date  . Alzheimer's disease ([facility]) 11/29/2015  . Anemia, unspecified    pernicious  . Anxiety    unspecified  . Arthritis   . Atrial fibrillation ([facility])   . CHF (congestive heart failure) ([facility]) 01/2006  . History of kidney stones   . Hyperlipidemia   . Nephrolithiasis   . Parkinson's disease ([facility])   . Peripheral neuropathy   . Prostate cancer ([facility]) 11/2006  . Psoriasis   . Skin cancer   . Sleep disturbance    REM  . Spinal stenosis    s/p lumbar laminectomy  . Upper GI bleed    unspecified; history of AVMs  . Valvular heart disease   . Vertigo     SOCIAL HX:  Social History   Tobacco Use  . Smoking status: Former Smoker    Types: Cigarettes    Quit date: 03/30/1986    Years since quitting: 34.3  . Smokeless tobacco: Never Used  Substance Use Topics  . Alcohol use: No    ALLERGIES:  Allergies  Allergen Reactions  . Exelon [Rivastigmine Tartrate]     Increased agitation/irritability  . Other     Strawberries  . Penicillins      PERTINENT MEDICATIONS:  Outpatient Encounter Medications  as of 07/28/2020  Medication Sig  . acetaminophen (TYLENOL) 325 MG tablet Take 650 mg by mouth every 4 (four) hours as needed. for pain/ increased temp. May be administered orally, per G-tube if needed or rectally if unable to swallow (separate order). Maximum of 3000 mg Acetaminophen in 24 hours.  Marland Kitchen acetaminophen (TYLENOL) 500 MG tablet Take 500 mg by mouth 2 (two)  times daily. Maximum of 3000 mg of acetaminophen in 24 hours. Check prn acetaminophen order prior to administration due to limit.  Marland Kitchen alfuzosin (UROXATRAL) 10 MG 24 hr tablet Take 10 mg by mouth every evening.   Marland Kitchen buPROPion (WELLBUTRIN) 75 MG tablet Take 75 mg by mouth daily.  . carbidopa-levodopa (SINEMET CR) 50-200 MG tablet Take 1 tablet by mouth at bedtime.   . carbidopa-levodopa (SINEMET IR) 25-250 MG tablet Take 1 tablet by mouth 3 (three) times daily.   . ciprofloxacin (CIPRO) 500 MG tablet Take 500 mg by mouth 2 (two) times daily.  . clonazePAM (KLONOPIN) 0.5 MG tablet Take 1 tablet (0.5 mg total) by mouth at bedtime for 14 days.  . clonazePAM (KLONOPIN) 1 MG tablet Take 1 mg by mouth daily.  . clonazePAM (KLONOPIN) 1 MG tablet Take 1 tablet (1 mg total) by mouth daily. Take at 4PM  . cyanocobalamin 1000 MCG tablet Take 1,000 mcg by mouth daily.  . cyclobenzaprine (FLEXERIL) 5 MG tablet Take 5 mg by mouth every 8 (eight) hours as needed (Back pain).  . furosemide (LASIX) 20 MG tablet Take 20 mg by mouth daily as needed (CHF).   Marland Kitchen gabapentin (NEURONTIN) 100 MG capsule Take 200 mg by mouth 3 (three) times daily. 2 capsules to = 200 mg = 8 AM, Noon, 4PM  . hydrocortisone cream 1 % Apply 1 application topically 4 (four) times daily as needed for itching. For Psoriasis  . magnesium hydroxide (MILK OF MAGNESIA) 400 MG/5ML suspension Take 30 mLs by mouth every 4 (four) hours as needed for mild constipation.  . Melatonin 3 MG TABS Take 1 tablet by mouth at bedtime.  . midodrine (PROAMATINE) 2.5 MG tablet Take 2.5 mg by mouth 3 (three) times daily with meals. Take along with sinemet  . NON FORMULARY Diet type:  Regular  . Nutritional Supplements (NUTRITIONAL SUPPLEMENT PO) Take 1 each by mouth 3 (three) times daily with meals. Magic Cup  . polyethylene glycol (MIRALAX / GLYCOLAX) packet Take 17 g by mouth 2 (two) times daily. Mix with 4-6 ounces of fluid  . predniSONE (DELTASONE) 5 MG tablet Take 5  mg by mouth daily.   . Probiotic Product (RISA-BID PROBIOTIC) TABS Take 1 tablet by mouth 2 (two) times daily.  . sennosides-docusate sodium (SENOKOT-S) 8.6-50 MG tablet Take 2 tablets by mouth 3 (three) times daily.   . sertraline (ZOLOFT) 100 MG tablet Take 150 mg by mouth daily. Give 1-1/2 tablets to = 150 mg  . Skin Protectants, Misc. (ENDIT EX) Apply liberal amount topically to areas of skin irritation 2 times daily and prn. Ok to leave at bedside.  . sorbitol 70 % solution Take 30 mLs by mouth every 2 (two) hours as needed (constipation).   . triamcinolone cream (KENALOG) 0.1 % Apply 1 application topically daily as needed. Apply liberal amount topically as needed to areas of psoriasis    Facility-Administered Encounter Medications as of 07/28/2020  Medication  . lidocaine (XYLOCAINE) 2 % jelly 1 application     Gwendalynn Eckstrom Jenetta Downer, NP

## 2020-09-01 ENCOUNTER — Encounter
Admission: RE | Admit: 2020-09-01 | Discharge: 2020-09-01 | Disposition: A | Discharge status: Home / Self Care | Payer: Medicare Other | Source: Ambulatory Visit | Attending: Internal Medicine | Admitting: Internal Medicine

## 2020-10-11 ENCOUNTER — Other Ambulatory Visit: Payer: Self-pay

## 2020-10-11 ENCOUNTER — Non-Acute Institutional Stay: Payer: Medicare Other | Admitting: Adult Health Nurse Practitioner

## 2020-10-11 DIAGNOSIS — Z515 Encounter for palliative care: Secondary | ICD-10-CM

## 2020-10-11 DIAGNOSIS — G20 Parkinson's disease: Secondary | ICD-10-CM

## 2020-10-11 DIAGNOSIS — F0281 Dementia in other diseases classified elsewhere with behavioral disturbance: Secondary | ICD-10-CM

## 2020-10-11 NOTE — Progress Notes (Signed)
[facility] Consult Note Telephone: [phone]  Fax: [phone]  PATIENT NAME: [name] [name] DOB: 04-Mar-1930 MRN: [id number]  PRIMARY CARE PROVIDER:   Kirk Ruths, MD  REFERRING PROVIDER:  Kirk Ruths, MD [facility] [place],  [place] [address]  RESPONSIBLE PARTY:   Daughter, [name] [name] [phone]   RECOMMENDATIONS and PLAN: 1.Advanced care planning. Patient is a DNR.  Called daughter to update on visit.  Left VM with reason for call and call back info  2.  Parkinson's/Dementia. Patient is wheelchair bound. Dependent for ADLs. Able to feed himself. Incontinent of B&B. Patient is easily distractedand does not answer what is being asked.  He is doing well on current regimen of Sinemet with no tremors noted today. He is eating well and weight is 122.6.  Patient does have frequent falls especially at night when he tries to get up out of bed unassisted.  Last fall was 09/07/2020 with no injury.  Continue supportive care at the facility  Denies pain, increased shortness of breath or cough, N/V/D, constipation.  Patient has not had any infections or hospital visits since last visit.  We will continue to monitor for symptom management/decline and make recommendations as needed.  Follow-up in 4 to 6 weeks   I spent 30 minutes providing this consultation,  from 10:00 to 10:30 including time spent with patient/family, chart review, provider coordination, documentation. More than 50% of the time in this consultation was spent coordinating communication.   HISTORY OF PRESENT ILLNESS:  [name] [name] is a 86 y.o. year old male with multiple medical problems including Parkinson's disease, CHF, atrial fibrillation,anemia, arthritis, prostate cancer s/p surgery, peripheral neuropathy, spinal stenosis s/p lumbar laminectomy, psoriasis, history of upper GI bleed, history of nephrolithiasis,  cataract extraction, depression, anxiety. Palliative Care was asked to help address goals of care.   CODE STATUS: DNR  PPS: 30% HOSPICE ELIGIBILITY/DIAGNOSIS: TBD  PHYSICAL EXAM:  HR 69 O2 91% on RA General: NAD, frail appearing, thin Cardiovascular: regular rate and rhythm Pulmonary:lung sounds clear; normal respiratory effort Abdomen: soft, nontender, + bowel sounds GU: no suprapubic tenderness Extremities: no edema,wearing TED hose;no joint deformities Skin: no rashes Neurological: Weakness; A&O to person and place  PAST MEDICAL HISTORY:  Past Medical History:  Diagnosis Date  . Alzheimer's disease ([place]) 11/29/2015  . Anemia, unspecified    pernicious  . Anxiety    unspecified  . Arthritis   . Atrial fibrillation ([place])   . CHF (congestive heart failure) ([place]) 01/2006  . History of kidney stones   . Hyperlipidemia   . Nephrolithiasis   . Parkinson's disease ([place])   . Peripheral neuropathy   . Prostate cancer ([place]) 11/2006  . Psoriasis   . Skin cancer   . Sleep disturbance    REM  . Spinal stenosis    s/p lumbar laminectomy  . Upper GI bleed    unspecified; history of AVMs  . Valvular heart disease   . Vertigo     SOCIAL HX:  Social History   Tobacco Use  . Smoking status: Former Smoker    Types: Cigarettes    Quit date: 03/30/1986    Years since quitting: 34.5  . Smokeless tobacco: Never Used  Substance Use Topics  . Alcohol use: No    ALLERGIES:  Allergies  Allergen Reactions  . Exelon [Rivastigmine Tartrate]     Increased agitation/irritability  . Other     Strawberries  .  Penicillins      PERTINENT MEDICATIONS:  Outpatient Encounter Medications as of 10/11/2020  Medication Sig  . acetaminophen (TYLENOL) 325 MG tablet Take 650 mg by mouth every 4 (four) hours as needed. for pain/ increased temp. May be administered orally, per G-tube if needed or rectally if unable to swallow (separate order). Maximum of 3000 mg Acetaminophen in 24 hours.    Marland Kitchen acetaminophen (TYLENOL) 500 MG tablet Take 500 mg by mouth 2 (two) times daily. Maximum of 3000 mg of acetaminophen in 24 hours. Check prn acetaminophen order prior to administration due to limit.  Marland Kitchen alfuzosin (UROXATRAL) 10 MG 24 hr tablet Take 10 mg by mouth every evening.   Marland Kitchen buPROPion (WELLBUTRIN) 75 MG tablet Take 75 mg by mouth daily.  . carbidopa-levodopa (SINEMET CR) 50-200 MG tablet Take 1 tablet by mouth at bedtime.   . carbidopa-levodopa (SINEMET IR) 25-250 MG tablet Take 1 tablet by mouth 3 (three) times daily.   . ciprofloxacin (CIPRO) 500 MG tablet Take 500 mg by mouth 2 (two) times daily.  . clonazePAM (KLONOPIN) 0.5 MG tablet Take 1 tablet (0.5 mg total) by mouth at bedtime for 14 days.  . clonazePAM (KLONOPIN) 1 MG tablet Take 1 mg by mouth daily.  . clonazePAM (KLONOPIN) 1 MG tablet Take 1 tablet (1 mg total) by mouth daily. Take at 4PM  . cyanocobalamin 1000 MCG tablet Take 1,000 mcg by mouth daily.  . cyclobenzaprine (FLEXERIL) 5 MG tablet Take 5 mg by mouth every 8 (eight) hours as needed (Back pain).  . furosemide (LASIX) 20 MG tablet Take 20 mg by mouth daily as needed (CHF).   Marland Kitchen gabapentin (NEURONTIN) 100 MG capsule Take 200 mg by mouth 3 (three) times daily. 2 capsules to = 200 mg = 8 AM, Noon, 4PM  . hydrocortisone cream 1 % Apply 1 application topically 4 (four) times daily as needed for itching. For Psoriasis  . magnesium hydroxide (MILK OF MAGNESIA) 400 MG/5ML suspension Take 30 mLs by mouth every 4 (four) hours as needed for mild constipation.  . Melatonin 3 MG TABS Take 1 tablet by mouth at bedtime.  . midodrine (PROAMATINE) 2.5 MG tablet Take 2.5 mg by mouth 3 (three) times daily with meals. Take along with sinemet  . NON FORMULARY Diet type:  Regular  . Nutritional Supplements (NUTRITIONAL SUPPLEMENT PO) Take 1 each by mouth 3 (three) times daily with meals. Magic Cup  . polyethylene glycol (MIRALAX / GLYCOLAX) packet Take 17 g by mouth 2 (two) times daily. Mix  with 4-6 ounces of fluid  . predniSONE (DELTASONE) 5 MG tablet Take 5 mg by mouth daily.   . Probiotic Product (RISA-BID PROBIOTIC) TABS Take 1 tablet by mouth 2 (two) times daily.  . sennosides-docusate sodium (SENOKOT-S) 8.6-50 MG tablet Take 2 tablets by mouth 3 (three) times daily.   . sertraline (ZOLOFT) 100 MG tablet Take 150 mg by mouth daily. Give 1-1/2 tablets to = 150 mg  . Skin Protectants, Misc. (ENDIT EX) Apply liberal amount topically to areas of skin irritation 2 times daily and prn. Ok to leave at bedside.  . sorbitol 70 % solution Take 30 mLs by mouth every 2 (two) hours as needed (constipation).   . triamcinolone cream (KENALOG) 0.1 % Apply 1 application topically daily as needed. Apply liberal amount topically as needed to areas of psoriasis    Facility-Administered Encounter Medications as of 10/11/2020  Medication  . lidocaine (XYLOCAINE) 2 % jelly 1 application  Flynn Lininger Jenetta Downer, NP

## 2020-11-15 ENCOUNTER — Encounter: Payer: Self-pay | Admitting: Urology

## 2020-11-15 ENCOUNTER — Ambulatory Visit (INDEPENDENT_AMBULATORY_CARE_PROVIDER_SITE_OTHER): Payer: Medicare Other | Admitting: Urology

## 2020-11-15 ENCOUNTER — Other Ambulatory Visit: Payer: Self-pay

## 2020-11-15 VITALS — BP 120/65 | HR 92 | Ht 68.0 in | Wt 122.0 lb

## 2020-11-15 DIAGNOSIS — C673 Malignant neoplasm of anterior wall of bladder: Secondary | ICD-10-CM

## 2020-11-15 NOTE — Patient Instructions (Signed)
Urine and culture please fax results to [phone]

## 2020-11-15 NOTE — Progress Notes (Signed)
   11/15/2020 1:57 PM   Veronda Prude 1930-07-05 [id number]  Reason for visit: Follow up bladder cancer  HPI: Mr. Steelman was originally scheduled for surveillance cystoscopy today for his history of noninvasive bladder cancer.  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  He underwent biopsy and fulguration at that time, as well as postoperative gemcitabine.  He had no evidence of recurrence at his last cystoscopy in November 2020.  He has dementia and has become increasingly frail, and is being seen by palliative care.  Today in clinic, he appears extremely fragile and frail, and I recommended deferring cystoscopy with his other medical problems and comorbidities.  He denies any gross hematuria.  He has bowel and bladder incontinence.  He has a history of MRSA UTIs.  He did not have the urge to void today, but I recommended checking a urinalysis and culture at his facility, and treating with culture appropriate antibiotics if needed.  He would not be a candidate for anticholinergics with his age and dementia and high fall risk.  Urinalysis and culture at facility, treat with antibiotics if positive No further surveillance cystoscopy unless hematuria or clots in the setting of his significant frailty and comorbidities  Billey Co, MD  [facility] [address] [place], [place] [address] [phone]

## 2020-11-17 ENCOUNTER — Other Ambulatory Visit
Admission: RE | Admit: 2020-11-17 | Discharge: 2020-11-17 | Disposition: A | Discharge status: Home / Self Care | Payer: Medicare Other | Attending: Internal Medicine | Admitting: Internal Medicine

## 2020-11-17 DIAGNOSIS — Z8551 Personal history of malignant neoplasm of bladder: Secondary | ICD-10-CM | POA: Insufficient documentation

## 2020-11-17 DIAGNOSIS — Z87440 Personal history of urinary (tract) infections: Secondary | ICD-10-CM | POA: Insufficient documentation

## 2020-11-17 DIAGNOSIS — R54 Age-related physical debility: Secondary | ICD-10-CM | POA: Diagnosis not present

## 2020-11-17 DIAGNOSIS — G20 Parkinson's disease: Secondary | ICD-10-CM | POA: Insufficient documentation

## 2020-11-17 DIAGNOSIS — F0280 Dementia in other diseases classified elsewhere without behavioral disturbance: Secondary | ICD-10-CM | POA: Diagnosis not present

## 2020-11-17 DIAGNOSIS — R32 Unspecified urinary incontinence: Secondary | ICD-10-CM | POA: Diagnosis present

## 2020-11-17 LAB — URINALYSIS, COMPLETE (UACMP) WITH MICROSCOPIC
Bilirubin Urine: NEGATIVE
Glucose, UA: NEGATIVE mg/dL
Ketones, ur: NEGATIVE mg/dL
Nitrite: NEGATIVE
Protein, ur: NEGATIVE mg/dL
Specific Gravity, Urine: 1.008 (ref 1.005–1.030)
Squamous Epithelial / HPF: NONE SEEN (ref 0–5)
WBC, UA: >50 WBC/hpf — ABNORMAL HIGH (ref 0–5)
pH: 6 (ref 5.0–8.0)

## 2020-11-18 LAB — URINE CULTURE

## 2020-11-20 ENCOUNTER — Other Ambulatory Visit: Payer: Medicare Other | Admitting: Urology

## 2020-11-20 ENCOUNTER — Other Ambulatory Visit
Admission: RE | Admit: 2020-11-20 | Discharge: 2020-11-20 | Disposition: A | Discharge status: Home / Self Care | Payer: Medicare Other | Source: Ambulatory Visit | Attending: Internal Medicine | Admitting: Internal Medicine

## 2020-11-20 DIAGNOSIS — R319 Hematuria, unspecified: Secondary | ICD-10-CM | POA: Insufficient documentation

## 2020-11-20 LAB — URINALYSIS, COMPLETE (UACMP) WITH MICROSCOPIC
Bilirubin Urine: NEGATIVE
Glucose, UA: NEGATIVE mg/dL
Ketones, ur: NEGATIVE mg/dL
Nitrite: NEGATIVE
Protein, ur: NEGATIVE mg/dL
Specific Gravity, Urine: 1.008 (ref 1.005–1.030)
pH: 6 (ref 5.0–8.0)

## 2020-11-21 LAB — URINE CULTURE

## 2020-11-22 ENCOUNTER — Other Ambulatory Visit
Admission: RE | Admit: 2020-11-22 | Discharge: 2020-11-22 | Disposition: A | Discharge status: Home / Self Care | Payer: Medicare Other | Source: Ambulatory Visit | Attending: Internal Medicine | Admitting: Internal Medicine

## 2020-11-22 ENCOUNTER — Non-Acute Institutional Stay: Payer: Medicare Other | Admitting: Adult Health Nurse Practitioner

## 2020-11-22 ENCOUNTER — Other Ambulatory Visit: Payer: Self-pay

## 2020-11-22 DIAGNOSIS — R319 Hematuria, unspecified: Secondary | ICD-10-CM | POA: Diagnosis present

## 2020-11-22 DIAGNOSIS — F02818 Dementia in other diseases classified elsewhere, unspecified severity, with other behavioral disturbance: Secondary | ICD-10-CM

## 2020-11-22 DIAGNOSIS — A4902 Methicillin resistant Staphylococcus aureus infection, unspecified site: Secondary | ICD-10-CM | POA: Diagnosis not present

## 2020-11-22 DIAGNOSIS — Z515 Encounter for palliative care: Secondary | ICD-10-CM

## 2020-11-22 DIAGNOSIS — F0281 Dementia in other diseases classified elsewhere with behavioral disturbance: Secondary | ICD-10-CM

## 2020-11-22 LAB — URINALYSIS, ROUTINE W REFLEX MICROSCOPIC
Bilirubin Urine: NEGATIVE
Glucose, UA: NEGATIVE mg/dL
Ketones, ur: NEGATIVE mg/dL
Nitrite: NEGATIVE
Protein, ur: NEGATIVE mg/dL
Specific Gravity, Urine: 1.005 (ref 1.005–1.030)
Squamous Epithelial / HPF: NONE SEEN (ref 0–5)
WBC, UA: >50 WBC/hpf — ABNORMAL HIGH (ref 0–5)
pH: 7 (ref 5.0–8.0)

## 2020-11-22 NOTE — Progress Notes (Signed)
[facility] Consult Note Telephone: [phone]  Fax: [phone]  PATIENT NAME: [name] [name] DOB: 08/07/1930 MRN: [id number]  PRIMARY CARE PROVIDER:   Kirk Ruths, MD  REFERRING PROVIDER:  Kirk Ruths, MD [facility] [place],  [place] [address]  RESPONSIBLE PARTY:  Daughter, Peter Congo [phone]   RECOMMENDATIONS and PLAN: 1.Advanced care planning. Patient is a DNR.Called daughter to update on visit. Left VM with reason for call and call back info  2.Parkinson's/Dementia. Patient is wheelchair bound. Dependent for ADLs. Able to feed himself. Incontinent of B&B. Doing well on current dose of sinemet.  Appetite good with no reported weight loss.  No reported falls though still tries to get up unassisted.  Continue supportive care at facility  Denies pain, increased shortness of breath or cough, N/V/D, constipation.  Patient has not had any falls, infections or hospital visits since last visit.  Will continue to monitor for symptom management/decline and make recommendations as needed.  Follow-up in 6-8 weeks  I spent 30 minutes providing this consultation. More than 50% of the time in this consultation was spent coordinating communication.   HISTORY OF PRESENT ILLNESS:  [name] [name] is a 86 y.o. year old male with multiple medical problems including Parkinson's disease, CHF, atrial fibrillation,anemia, arthritis, prostate cancer s/p surgery, peripheral neuropathy, spinal stenosis s/p lumbar laminectomy, psoriasis, history of upper GI bleed, history of nephrolithiasis, cataract extraction, depression, anxiety. Palliative Care was asked to help address goals of care.   CODE STATUS: DNR  PPS: 30% HOSPICE ELIGIBILITY/DIAGNOSIS: TBD  PHYSICAL EXAM:  [id number]% on RA General: NAD, frail appearing, thin Cardiovascular: regular rate and rhythm Pulmonary:lung  sounds clear; normal respiratory effort Abdomen: soft, nontender, + bowel sounds GU: no suprapubic tenderness Extremities: no edema,wearing TED hose;no joint deformities Skin: no rashes Neurological: Weakness; A&O to person and place  PAST MEDICAL HISTORY:  Past Medical History:  Diagnosis Date  . Alzheimer's disease ([place]) 11/29/2015  . Anemia, unspecified    pernicious  . Anxiety    unspecified  . Arthritis   . Atrial fibrillation ([place])   . CHF (congestive heart failure) ([place]) 01/2006  . History of kidney stones   . Hyperlipidemia   . Nephrolithiasis   . Parkinson's disease ([place])   . Peripheral neuropathy   . Prostate cancer ([place]) 11/2006  . Psoriasis   . Skin cancer   . Sleep disturbance    REM  . Spinal stenosis    s/p lumbar laminectomy  . Upper GI bleed    unspecified; history of AVMs  . Valvular heart disease   . Vertigo     SOCIAL HX:  Social History   Tobacco Use  . Smoking status: Former Smoker    Types: Cigarettes    Quit date: 03/30/1986    Years since quitting: 34.6  . Smokeless tobacco: Never Used  Substance Use Topics  . Alcohol use: No    ALLERGIES:  Allergies  Allergen Reactions  . Exelon [Rivastigmine Tartrate]     Increased agitation/irritability  . Other     Strawberries  . Penicillins      PERTINENT MEDICATIONS:  Outpatient Encounter Medications as of 11/22/2020  Medication Sig  . acetaminophen (TYLENOL) 325 MG tablet Take 650 mg by mouth every 4 (four) hours as needed. for pain/ increased temp. May be administered orally, per G-tube if needed or rectally if unable to swallow (separate order). Maximum of 3000 mg Acetaminophen in 24  hours.  . acetaminophen (TYLENOL) 500 MG tablet Take 500 mg by mouth 2 (two) times daily. Maximum of 3000 mg of acetaminophen in 24 hours. Check prn acetaminophen order prior to administration due to limit.  Marland Kitchen alfuzosin (UROXATRAL) 10 MG 24 hr tablet Take 10 mg by mouth every evening.   Marland Kitchen buPROPion  (WELLBUTRIN) 75 MG tablet Take 75 mg by mouth daily.  . carbidopa-levodopa (SINEMET CR) 50-200 MG tablet Take 1 tablet by mouth at bedtime.   . carbidopa-levodopa (SINEMET IR) 25-250 MG tablet Take 1 tablet by mouth 3 (three) times daily.   . ciprofloxacin (CIPRO) 500 MG tablet Take 500 mg by mouth 2 (two) times daily.  . clonazePAM (KLONOPIN) 0.5 MG tablet Take 1 tablet (0.5 mg total) by mouth at bedtime for 14 days.  . clonazePAM (KLONOPIN) 1 MG tablet Take 1 mg by mouth daily.  . clonazePAM (KLONOPIN) 1 MG tablet Take 1 tablet (1 mg total) by mouth daily. Take at 4PM  . cyanocobalamin 1000 MCG tablet Take 1,000 mcg by mouth daily.  . cyclobenzaprine (FLEXERIL) 5 MG tablet Take 5 mg by mouth every 8 (eight) hours as needed (Back pain).  . furosemide (LASIX) 20 MG tablet Take 20 mg by mouth daily as needed (CHF).   Marland Kitchen gabapentin (NEURONTIN) 100 MG capsule Take 200 mg by mouth 3 (three) times daily. 2 capsules to = 200 mg = 8 AM, Noon, 4PM  . hydrocortisone cream 1 % Apply 1 application topically 4 (four) times daily as needed for itching. For Psoriasis  . magnesium hydroxide (MILK OF MAGNESIA) 400 MG/5ML suspension Take 30 mLs by mouth every 4 (four) hours as needed for mild constipation.  . Melatonin 3 MG TABS Take 1 tablet by mouth at bedtime.  . midodrine (PROAMATINE) 2.5 MG tablet Take 2.5 mg by mouth 3 (three) times daily with meals. Take along with sinemet  . NON FORMULARY Diet type:  Regular  . Nutritional Supplements (NUTRITIONAL SUPPLEMENT PO) Take 1 each by mouth 3 (three) times daily with meals. Magic Cup  . polyethylene glycol (MIRALAX / GLYCOLAX) packet Take 17 g by mouth 2 (two) times daily. Mix with 4-6 ounces of fluid  . predniSONE (DELTASONE) 5 MG tablet Take 5 mg by mouth daily.   . Probiotic Product (RISA-BID PROBIOTIC) TABS Take 1 tablet by mouth 2 (two) times daily.  . sennosides-docusate sodium (SENOKOT-S) 8.6-50 MG tablet Take 2 tablets by mouth 3 (three) times daily.   .  sertraline (ZOLOFT) 100 MG tablet Take 150 mg by mouth daily. Give 1-1/2 tablets to = 150 mg  . Skin Protectants, Misc. (ENDIT EX) Apply liberal amount topically to areas of skin irritation 2 times daily and prn. Ok to leave at bedside.  . sorbitol 70 % solution Take 30 mLs by mouth every 2 (two) hours as needed (constipation).   . traZODone (DESYREL) 50 MG tablet   . triamcinolone cream (KENALOG) 0.1 % Apply 1 application topically daily as needed. Apply liberal amount topically as needed to areas of psoriasis    Facility-Administered Encounter Medications as of 11/22/2020  Medication  . lidocaine (XYLOCAINE) 2 % jelly 1 application     Jilberto Vanderwall Jenetta Downer, NP

## 2020-11-25 LAB — URINE CULTURE: Culture: >=100000 — AB

## 2020-11-28 ENCOUNTER — Telehealth: Payer: Self-pay

## 2020-11-28 DIAGNOSIS — Z22322 Carrier or suspected carrier of Methicillin resistant Staphylococcus aureus: Secondary | ICD-10-CM

## 2020-11-28 MED ORDER — SULFAMETHOXAZOLE-TRIMETHOPRIM 800-160 MG PO TABS: 1.0000 | ORAL_TABLET | Freq: Two times a day (BID) | ORAL | 0 refills | Status: AC

## 2020-11-28 NOTE — Telephone Encounter (Signed)
Received secure chat from Maxwell Caul, RN stating that pt's medication orders need to be sent to [facility], RX sent.

## 2020-11-28 NOTE — Telephone Encounter (Signed)
is this regarding the positive urine culture? Would recommend 14 days of Bactrim DS bid  Dr. Ouida Sills sent some labs ont his pateint and wants to know what he should do?   Called [facility] for nurse asking if orders for antibiotic need to be faxed into their facility or directly to the pharmacy. Fax # [phone]. 1st attempt.

## 2021-01-04 ENCOUNTER — Other Ambulatory Visit
Admission: RE | Admit: 2021-01-04 | Discharge: 2021-01-04 | Disposition: A | Discharge status: Home / Self Care | Payer: Medicare Other | Source: Skilled Nursing Facility | Attending: Internal Medicine | Admitting: Internal Medicine

## 2021-01-04 DIAGNOSIS — Z8614 Personal history of Methicillin resistant Staphylococcus aureus infection: Secondary | ICD-10-CM | POA: Insufficient documentation

## 2021-01-04 DIAGNOSIS — R319 Hematuria, unspecified: Secondary | ICD-10-CM | POA: Diagnosis present

## 2021-01-04 LAB — URINALYSIS, COMPLETE (UACMP) WITH MICROSCOPIC
Bilirubin Urine: NEGATIVE
Glucose, UA: NEGATIVE mg/dL
Ketones, ur: NEGATIVE mg/dL
Nitrite: NEGATIVE
Protein, ur: NEGATIVE mg/dL
Specific Gravity, Urine: 1.008 (ref 1.005–1.030)
Squamous Epithelial / HPF: NONE SEEN (ref 0–5)
WBC, UA: >50 WBC/hpf — ABNORMAL HIGH (ref 0–5)
pH: 6 (ref 5.0–8.0)

## 2021-01-06 LAB — URINE CULTURE: Culture: >=100000 — AB

## 2021-02-14 ENCOUNTER — Other Ambulatory Visit: Payer: Self-pay

## 2021-02-14 ENCOUNTER — Non-Acute Institutional Stay: Payer: Medicare Other | Admitting: Adult Health Nurse Practitioner

## 2021-02-14 DIAGNOSIS — F02818 Dementia in other diseases classified elsewhere, unspecified severity, with other behavioral disturbance: Secondary | ICD-10-CM

## 2021-02-14 DIAGNOSIS — F0281 Dementia in other diseases classified elsewhere with behavioral disturbance: Secondary | ICD-10-CM

## 2021-02-14 DIAGNOSIS — Z515 Encounter for palliative care: Secondary | ICD-10-CM

## 2021-02-14 NOTE — Progress Notes (Signed)
[facility] Consult Note Telephone: [phone]  Fax: [phone]  PATIENT NAME: [name] [name] DOB: [birth year] MRN: [id number]  PRIMARY CARE PROVIDER:   Kirk Ruths, MD  REFERRING PROVIDER:  Kirk Ruths, MD [facility] [place],  [place] [address]  RESPONSIBLE PARTY:   Daughter, [name] [name] [phone]  Chief complaint: Follow-up palliative visit/dementia   RECOMMENDATIONS and PLAN: 1.Advanced care planning. Patient is a DNR.  Spoke with daughter via telephone to update on visit today.  Daughter has no new concerns.  2.  Parkinson's/dementia.  Patient is wheelchair-bound and requires total care for ADLs.  He is able to feed himself.  Incontinent of bowel and bladder.  Appears to be doing well on current dosage of Sinemet.  Appetite is good with no reported weight loss.   Palliative will continue to monitor for symptom management/decline and make recommendations as needed.  Follow-up in 6 to 8 weeks.  Daughter encouraged to call with any questions or concerns  I spent 35 minutes providing this consultation, including time spent with patient/family, provider coordination, chart review, documentation. More than 50% of the time in this consultation was spent coordinating communication.   HISTORY OF PRESENT ILLNESS:  [name] [name] is a 88 y.o. year old male with multiple medical problems including Parkinson's disease, CHF, atrial fibrillation,anemia, arthritis, prostate cancer s/p surgery, peripheral neuropathy, spinal stenosis s/p lumbar laminectomy, psoriasis, history of upper GI bleed, history of nephrolithiasis, cataract extraction, depression, anxiety. Palliative Care was asked to help address goals of care.  Patient still in bed this morning during visit.  Does not talk much but is arousable.  HPI/ROS limited due to dementia.  Staff have no new concerns.  Patient did have a fall on  01/19/2021 with no major injuries.  Patient has not had any infections or hospital visits since last visit.  CODE STATUS: DNR  PPS: 30% HOSPICE ELIGIBILITY/DIAGNOSIS: TBD  PHYSICAL EXAM:  [id number]% on RA General: NAD, frail appearing, thin Eyes: Sclera anicteric and noninjected with no discharge noted ENMT: Moist mucous membranes Cardiovascular: regular rate and rhythm Pulmonary:lung sounds clear; normal respiratory effort Abdomen: soft, nontender, + bowel sound Extremities: no edema;no joint deformities Skin: no rashes on exposed skin Neurological: Weakness; A&O to person and place  PAST MEDICAL HISTORY:  Past Medical History:  Diagnosis Date  . Alzheimer's disease ([place]) 11/29/2015  . Anemia, unspecified    pernicious  . Anxiety    unspecified  . Arthritis   . Atrial fibrillation ([place])   . CHF (congestive heart failure) ([place]) 01/2006  . History of kidney stones   . Hyperlipidemia   . Nephrolithiasis   . Parkinson's disease ([place])   . Peripheral neuropathy   . Prostate cancer ([place]) 11/2006  . Psoriasis   . Skin cancer   . Sleep disturbance    REM  . Spinal stenosis    s/p lumbar laminectomy  . Upper GI bleed    unspecified; history of AVMs  . Valvular heart disease   . Vertigo     SOCIAL HX:  Social History   Tobacco Use  . Smoking status: Former Smoker    Types: Cigarettes    Quit date: 03/30/1986    Years since quitting: 34.9  . Smokeless tobacco: Never Used  Substance Use Topics  . Alcohol use: No    ALLERGIES:  Allergies  Allergen Reactions  . Exelon [Rivastigmine Tartrate]     Increased agitation/irritability  . Other  Strawberries  . Penicillins      PERTINENT MEDICATIONS:  Outpatient Encounter Medications as of 02/14/2021  Medication Sig  . acetaminophen (TYLENOL) 325 MG tablet Take 650 mg by mouth every 4 (four) hours as needed. for pain/ increased temp. May be administered orally, per G-tube if needed or rectally if unable to  swallow (separate order). Maximum of 3000 mg Acetaminophen in 24 hours.  Marland Kitchen acetaminophen (TYLENOL) 500 MG tablet Take 500 mg by mouth 2 (two) times daily. Maximum of 3000 mg of acetaminophen in 24 hours. Check prn acetaminophen order prior to administration due to limit.  Marland Kitchen alfuzosin (UROXATRAL) 10 MG 24 hr tablet Take 10 mg by mouth every evening.   Marland Kitchen buPROPion (WELLBUTRIN) 75 MG tablet Take 75 mg by mouth daily.  . carbidopa-levodopa (SINEMET CR) 50-200 MG tablet Take 1 tablet by mouth at bedtime.   . carbidopa-levodopa (SINEMET IR) 25-250 MG tablet Take 1 tablet by mouth 3 (three) times daily.   . ciprofloxacin (CIPRO) 500 MG tablet Take 500 mg by mouth 2 (two) times daily.  . clonazePAM (KLONOPIN) 0.5 MG tablet Take 1 tablet (0.5 mg total) by mouth at bedtime for 14 days.  . clonazePAM (KLONOPIN) 1 MG tablet Take 1 mg by mouth daily.  . clonazePAM (KLONOPIN) 1 MG tablet Take 1 tablet (1 mg total) by mouth daily. Take at 4PM  . cyanocobalamin 1000 MCG tablet Take 1,000 mcg by mouth daily.  . cyclobenzaprine (FLEXERIL) 5 MG tablet Take 5 mg by mouth every 8 (eight) hours as needed (Back pain).  . furosemide (LASIX) 20 MG tablet Take 20 mg by mouth daily as needed (CHF).   Marland Kitchen gabapentin (NEURONTIN) 100 MG capsule Take 200 mg by mouth 3 (three) times daily. 2 capsules to = 200 mg = 8 AM, Noon, 4PM  . hydrocortisone cream 1 % Apply 1 application topically 4 (four) times daily as needed for itching. For Psoriasis  . magnesium hydroxide (MILK OF MAGNESIA) 400 MG/5ML suspension Take 30 mLs by mouth every 4 (four) hours as needed for mild constipation.  . Melatonin 3 MG TABS Take 1 tablet by mouth at bedtime.  . midodrine (PROAMATINE) 2.5 MG tablet Take 2.5 mg by mouth 3 (three) times daily with meals. Take along with sinemet  . NON FORMULARY Diet type:  Regular  . Nutritional Supplements (NUTRITIONAL SUPPLEMENT PO) Take 1 each by mouth 3 (three) times daily with meals. Magic Cup  . polyethylene glycol  (MIRALAX / GLYCOLAX) packet Take 17 g by mouth 2 (two) times daily. Mix with 4-6 ounces of fluid  . predniSONE (DELTASONE) 5 MG tablet Take 5 mg by mouth daily.   . Probiotic Product (RISA-BID PROBIOTIC) TABS Take 1 tablet by mouth 2 (two) times daily.  . sennosides-docusate sodium (SENOKOT-S) 8.6-50 MG tablet Take 2 tablets by mouth 3 (three) times daily.   . sertraline (ZOLOFT) 100 MG tablet Take 150 mg by mouth daily. Give 1-1/2 tablets to = 150 mg  . Skin Protectants, Misc. (ENDIT EX) Apply liberal amount topically to areas of skin irritation 2 times daily and prn. Ok to leave at bedside.  . sorbitol 70 % solution Take 30 mLs by mouth every 2 (two) hours as needed (constipation).   . traZODone (DESYREL) 50 MG tablet   . triamcinolone cream (KENALOG) 0.1 % Apply 1 application topically daily as needed. Apply liberal amount topically as needed to areas of psoriasis    Facility-Administered Encounter Medications as of 02/14/2021  Medication  .  lidocaine (XYLOCAINE) 2 % jelly 1 application     Marisue Canion Jenetta Downer, NP

## 2021-03-16 IMAGING — CT CT HEAD WITHOUT CONTRAST
5 of 8 series · 14 of 47 positions shown, 15 images · non-contrast
Comparison: 01/27/2016

CLINICAL DATA: Recent fall with headaches and neck pain, initial
encounter

EXAM:
CT HEAD WITHOUT CONTRAST
CT CERVICAL SPINE WITHOUT CONTRAST
TECHNIQUE: Multidetector CT imaging of the head and cervical spine was
performed following the standard protocol without intravenous
contrast. Multiplanar CT image reconstructions of the cervical spine
were also generated.

[Series 2: head wo · axial · 0.41mm/px · z∈[+452,+522]mm · 3 of 30 slices shown, 4 images]
[im 8/30  brain]
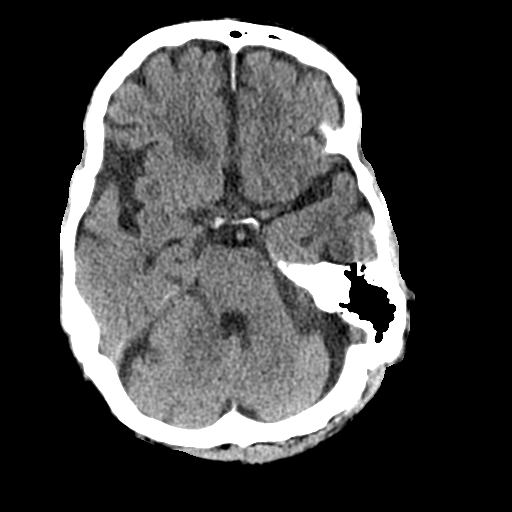
[im 8/30  bone]
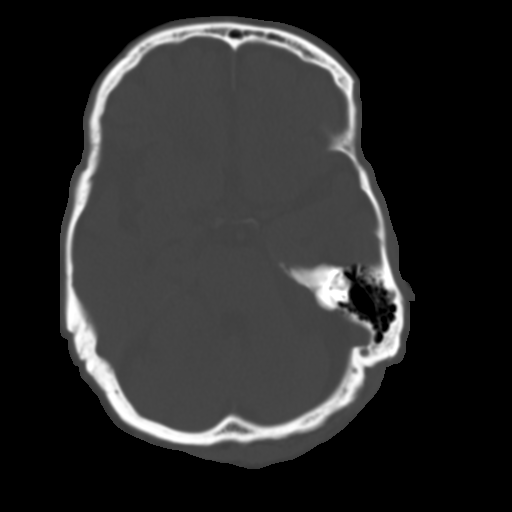
[im 15/30  brain]
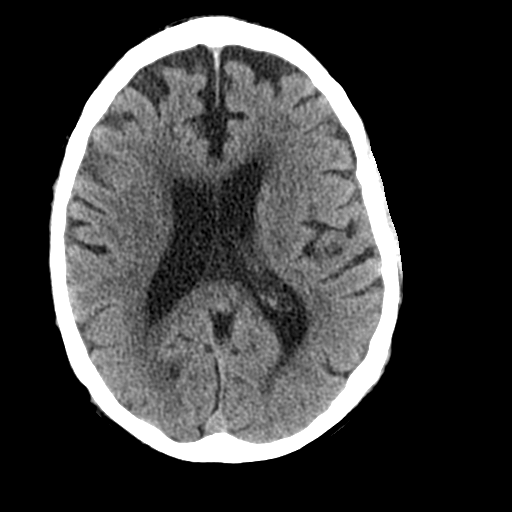
[im 22/30  brain]
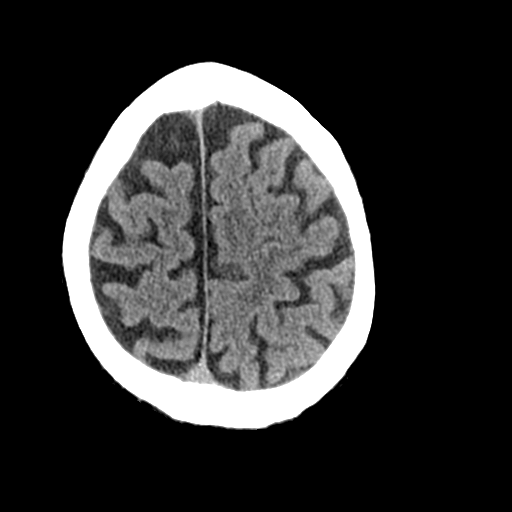

[Series 3: ax head wo · axial · 0.34mm/px · z∈[+431,+500]mm · 3 of 30 slices shown]
[im 8/30  brain]
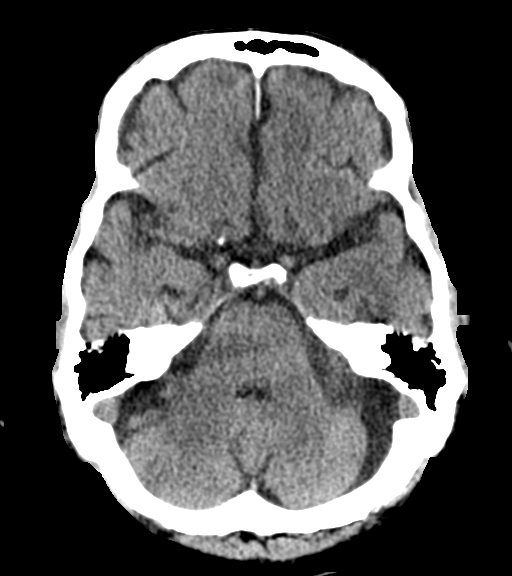
[im 15/30  brain]
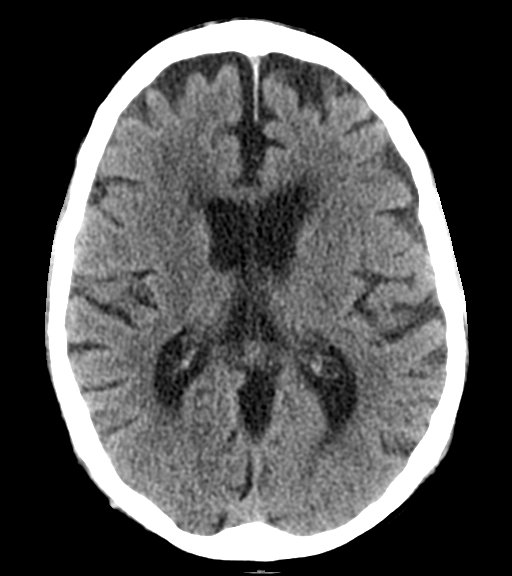
[im 22/30  brain]
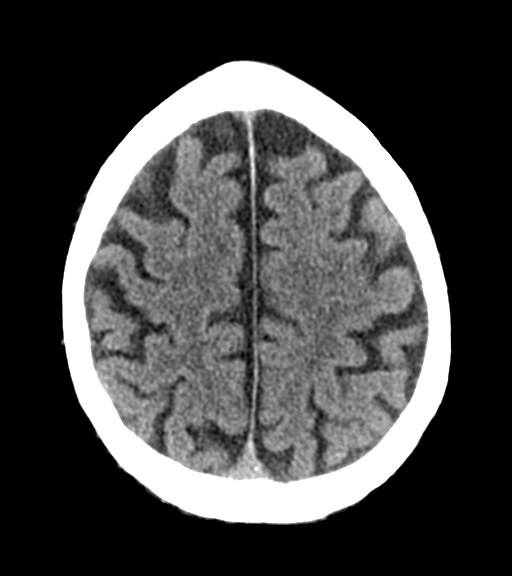

[Series 5: coronal soft tissue · coronal · 0.28mm/px · 3 of 63 slices shown]
[im 24/63  brain]
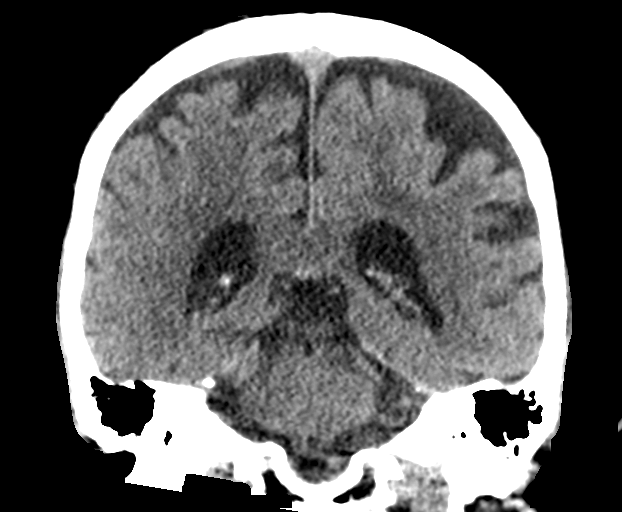
[im 32/63  brain]
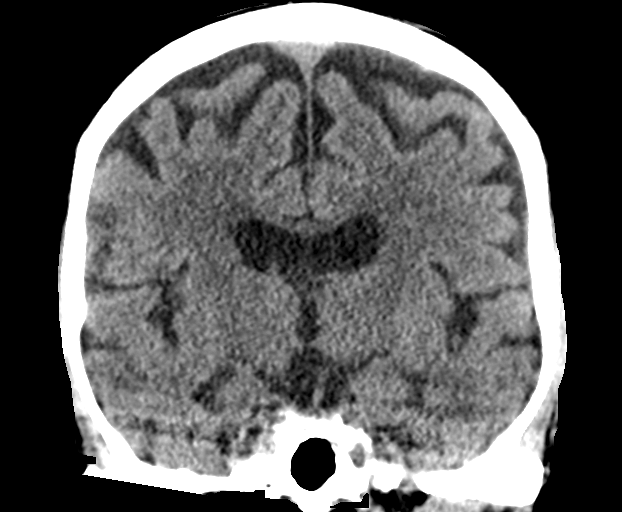
[im 39/63  brain]
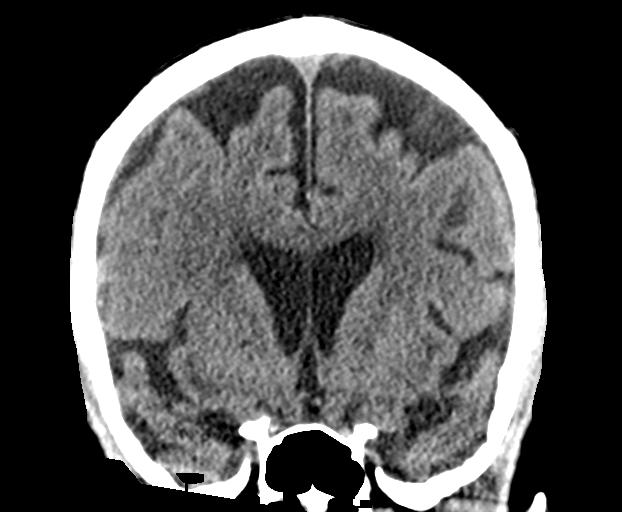

[Series 6: sagittal soft tissue · sagittal · 0.30mm/px · 1 of 50 slices shown]
[im 25/50  brain]
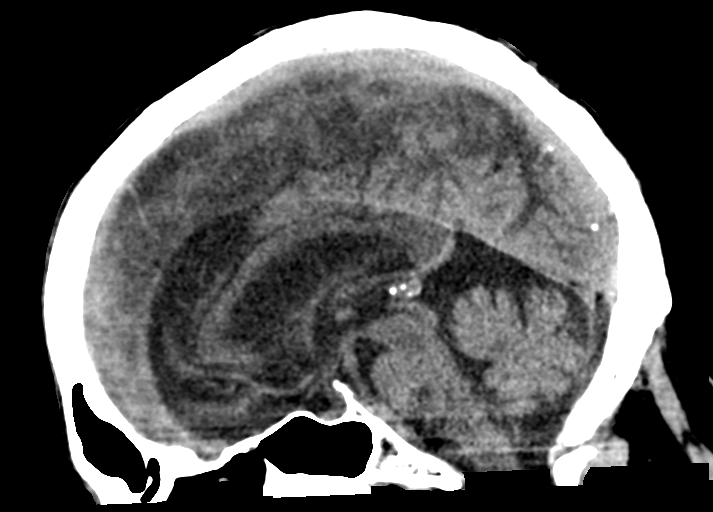

[Series 10: coronal bone · axial · 0.22mm/px · z∈[+409,+444]mm · 4 of 51 slices shown]
[im 8/51  bone]
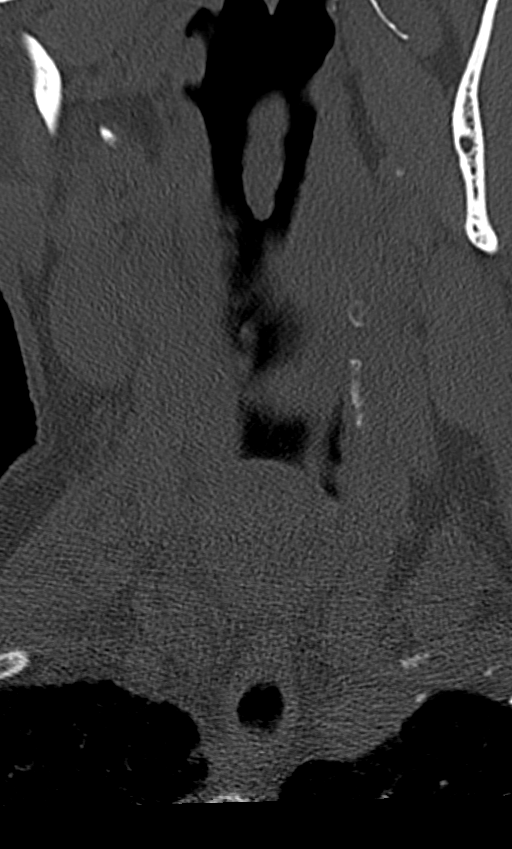
[im 15/51  bone]
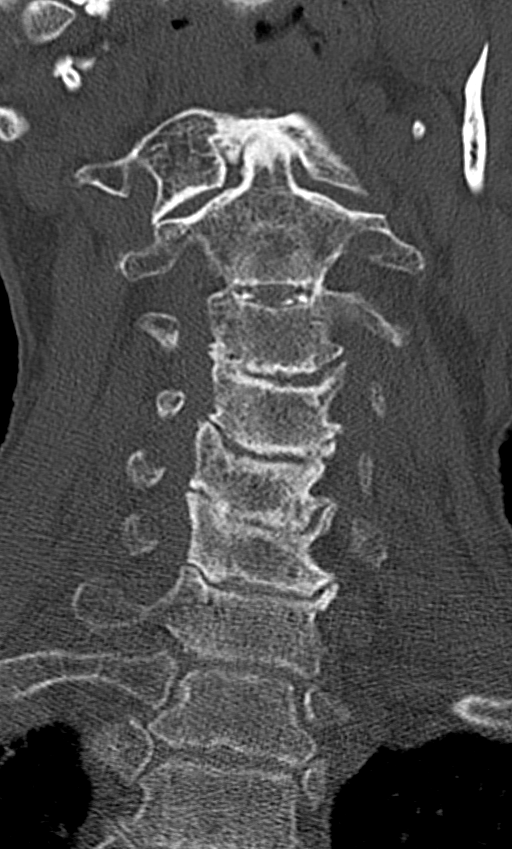
[im 22/51  bone]
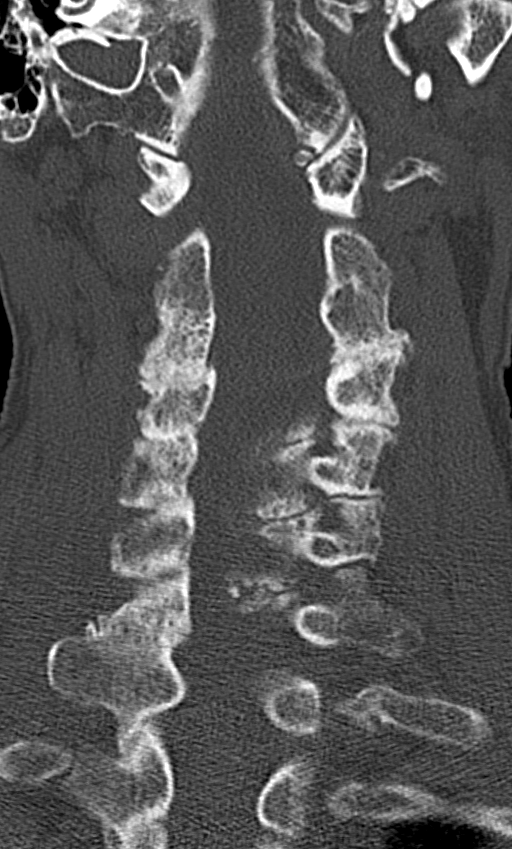
[im 29/51  bone]
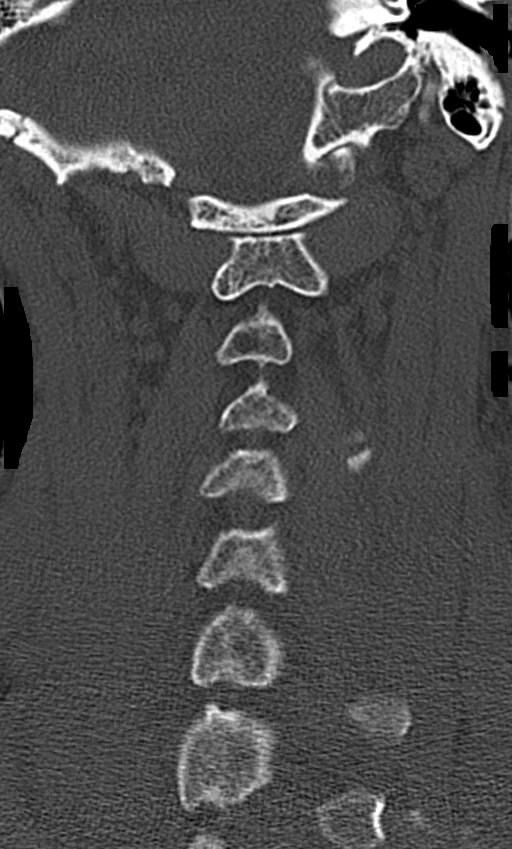

[14 of 47 positions shown; findings below may reference images not displayed]

FINDINGS: CT HEAD FINDINGS

Brain: Chronic atrophic and ischemic changes are noted. No findings
to suggest acute hemorrhage, acute infarction or space-occupying
mass lesion are noted.

Vascular: No hyperdense vessel or unexpected calcification.

Skull: Normal. Negative for fracture or focal lesion.

Sinuses/Orbits: No acute finding.

Other: None.

CT CERVICAL SPINE FINDINGS

Alignment: Within normal limits.

Skull base and vertebrae: 7 cervical segments are well visualized.
Vertebral body height is well maintained. Degenerative
anterolisthesis of C7 on T1 is noted. Disc space narrowing is noted
from C3-C7 with associated osteophytic changes. No acute fracture or
acute facet abnormality is noted.

Soft tissues and spinal canal: Surrounding soft tissues are within
normal limits.

Upper chest: Visualized lung apices are unremarkable.

Other: None
IMPRESSION: CT of the head: Chronic atrophic and ischemic changes.

CT of the cervical spine: Multilevel degenerative change without
acute bony abnormality.

Degenerative anterolisthesis of C7 on T1 is noted.

## 2021-05-25 ENCOUNTER — Non-Acute Institutional Stay: Payer: Medicare Other | Admitting: Adult Health Nurse Practitioner

## 2021-05-25 ENCOUNTER — Encounter: Payer: Self-pay | Admitting: Adult Health Nurse Practitioner

## 2021-05-25 ENCOUNTER — Other Ambulatory Visit: Payer: Self-pay

## 2021-05-25 VITALS — HR 81 | Wt 134.4 lb

## 2021-05-25 DIAGNOSIS — F0281 Dementia in other diseases classified elsewhere with behavioral disturbance: Secondary | ICD-10-CM

## 2021-05-25 DIAGNOSIS — F02818 Dementia in other diseases classified elsewhere, unspecified severity, with other behavioral disturbance: Secondary | ICD-10-CM

## 2021-05-25 DIAGNOSIS — Z515 Encounter for palliative care: Secondary | ICD-10-CM

## 2021-05-25 NOTE — Progress Notes (Signed)
[facility] Consult Note Telephone: [phone]  Fax: [phone]    Date of encounter: 05/25/21 PATIENT NAME: [name] [name] [address] [place] [address]   [phone] (home)  DOB: [birth year] MRN: [id number] PRIMARY CARE PROVIDER:    Kirk Ruths, MD,  [address] [place] [address] [phone]  REFERRING PROVIDER:   Kirk Ruths, MD [facility] [place],   [address] [phone]  RESPONSIBLE PARTY:    Contact Information    Name Relation Home Work [facility] Daughter [phone]     [name], [name]   [phone]       I met face to face with patient in facility. Palliative Care was asked to follow this patient by consultation request of  [name] Ruths, MD to address advance care planning and complex medical decision making. This is a follow up visit.                                   ASSESSMENT AND PLAN / RECOMMENDATIONS:   Advance Care Planning/Goals of Care: Goals include to maximize quality of life and symptom management.   CODE STATUS: DNR  Symptom Management/Plan:  Parkinson's/dementia.  Patient is wheelchair-bound and requires total care for ADLs but is able to feed himself. He in incontinent of bowel and bladder.  Appetite is good. Continue supportive care at the facility.     Follow up Palliative Care Visit: Palliative care will continue to follow for complex medical decision making, advance care planning, and clarification of goals. Return 8-10 weeks or prn.  I spent 35 minutes providing this consultation. More than 50% of the time in this consultation was spent in counseling and care coordination.   PPS: 30%  HOSPICE ELIGIBILITY/DIAGNOSIS: TBD  Chief Complaint: follow up palliative visit  HISTORY OF PRESENT ILLNESS:  [name] [name] is a 87 y.o. year old male  with  Parkinson's disease, CHF, atrial fibrillation,anemia, arthritis, prostate cancer s/p surgery, peripheral neuropathy, spinal stenosis s/p lumbar laminectomy, psoriasis, history of upper GI bleed, history of nephrolithiasis, cataract extraction, depression, anxiety. Patient is up in wheelchair eating breakfast this morning. HPI/ROS unobtainable due to dementia.  Staff have no new concerns today.  He did have a fall on 04/28/21 in which he was found on the floor of his room. No injury noted with this fall. He has not had any hospitalizations or infection since last visit.  History obtained from review of EMR and interview with family, facility staff and [name] [name].    PHYSICAL EXAM:   General: NAD, frail appearing, thin Eyes: Sclera anicteric and noninjected with no discharge noted ENMT: Moist mucous membranes Cardiovascular: regular rate and rhythm Pulmonary:lung sounds clear; normal respiratory effort Abdomen: soft, nontender, + bowel sound Extremities: no edema;no joint deformities Skin: no rashes on exposed skin Neurological: Weakness; A&O to person and place   Thank you for the opportunity to participate in the care of [name] [name].  The palliative care team will continue to follow. Please call our office at [phone] if we can be of additional assistance.   [name] [name] [name] Downer, NP , DNP  This chart was dictated using voice recognition software. Despite best efforts to proofread, errors can occur which can change the documentation meaning.   COVID-19 PATIENT SCREENING TOOL Asked and negative response unless  otherwise noted:   Have you had symptoms of covid, tested positive or been in contact with someone with symptoms/positive test in the past 5-10 days? negative

## 2021-07-25 ENCOUNTER — Other Ambulatory Visit: Payer: Self-pay

## 2021-07-25 ENCOUNTER — Non-Acute Institutional Stay: Payer: Medicare Other | Admitting: Student

## 2021-07-25 DIAGNOSIS — G20 Parkinson's disease: Secondary | ICD-10-CM

## 2021-07-25 DIAGNOSIS — Z515 Encounter for palliative care: Secondary | ICD-10-CM

## 2021-07-25 DIAGNOSIS — F32A Depression, unspecified: Secondary | ICD-10-CM

## 2021-07-25 DIAGNOSIS — F0281 Dementia in other diseases classified elsewhere with behavioral disturbance: Secondary | ICD-10-CM

## 2021-07-25 NOTE — Progress Notes (Signed)
[facility] Pall[facility] Consult Note Telephone: [phone]  Fax: [phone]    Date of encounter: 07/25/21 PATIENT NAME: [name] [name] [address] [place] [address]   [phone] (home)  DOB: [birth year] MRN: [id number] PRIMARY CARE PROVIDER:    Kirk Ruths, MD,  [address] [place] [address] [phone]  REFERRING PROVIDER:   Kirk Ruths, MD [facility] [place],  [place] [address] [phone]  RESPONSIBLE PARTY:    Contact Information     Name Relation Home Work [facility] Daughter [phone]     [name], [name]   [phone]        I met face to face with patient in the facility. Palliative Care was asked to follow this patient by consultation request of  [name] Ruths, MD to address advance care planning and complex medical decision making. This is a follow up visit.                                   ASSESSMENT AND PLAN / RECOMMENDATIONS:   Advance Care Planning/Goals of Care: Goals include to maximize quality of life and symptom management. CODE STATUS: DNR  Symptom Management/Plan:  Parkinson's dementia-patient is dependent for all adl's, except for feeding. Staff to continue assisting with adl's. Monitor for falls/safety. Continue Sinemet as directed.   Anxiety/depression-mood has been stable. Anxiety worse in evenings. Continue clonazepam, sertraline and bupropion as directed.    Follow up Palliative Care Visit: Palliative care will continue to follow for complex medical decision making, advance care planning, and clarification of goals. Return in 8 weeks or prn.  I spent 35 minutes providing this consultation. More than 50% of the time in this consultation was spent in counseling and care coordination.    PPS: 30%  HOSPICE ELIGIBILITY/DIAGNOSIS: TBD  Chief Complaint: Palliative  Medicine follow up visit.   HISTORY OF PRESENT ILLNESS:  [name] [name] is a 87 y.o. year old male  with Parkinson's dementia with behavioral disturbance, atrial fibrillation, anemia,  depression, heart failure, anxiety, protein calorie malnutrition, spinal stenosis s/p lumbar laminectomy.     Patient resides at [facility] [place]. Staff report patient doing well. He requires assistance with all of his adl's. He is out of bed daily to w/c. He is able to feed himself, although it takes a long time. Receives magic cup TID. Patient with poor safety awareness; last fall on 07/18/21 where patient sustained skin tears to bilateral arms. No recent medication changes. No recent infections.  No recent ER visits or hospitalizations.   Patient received sitting up to wheel chair. He is feeding himself breakfast. Tremor noted. He is able to answer very few yes/no questions. Patient was seen by this NP in the past and he has had a notable cognitive decline. Geri sleeves to bilateral arms.    History obtained from review of EMR, discussion with primary team, and interview with family, facility staff/caregiver and/or [name] [name].  I reviewed available labs, medications, imaging, studies and related documents from the EMR.  Records reviewed and summarized above.   ROS  Patient unable to contribute to ROS due to his impaired cognition, dementia.  Physical Exam:  Weight: 124.3 pounds Constitutional: NAD General: frail appearing, thin EYES: anicteric sclera, lids intact, no discharge  ENMT: intact hearing, oral mucous membranes moist,  dentition intact CV: S1S2, RRR, no LE edema Pulmonary: LCTA, no increased work of breathing, no cough, room air Abdomen: normo-active BS + 4 quadrants, soft and non tender GU: deferred MSK: moves all extremities, non- ambulatory Skin: cool and dry, no rashes or wounds on visible skin Neuro: generalized weakness, alert & oriented to person Psych: non-anxious affect,  pleasant Hem/lymph/immuno: no widespread bruising   Thank you for the opportunity to participate in the care of [name] [name].  The palliative care team will continue to follow. Please call our office at [phone] if we can be of additional assistance.   [name] Slocumb, NP   COVID-19 PATIENT SCREENING TOOL Asked and negative response unless otherwise noted:   Have you had symptoms of covid, tested positive or been in contact with someone with symptoms/positive test in the past 5-10 days? No

## 2021-09-28 ENCOUNTER — Non-Acute Institutional Stay: Payer: Medicare Other | Admitting: Student

## 2021-09-28 ENCOUNTER — Other Ambulatory Visit: Payer: Self-pay

## 2021-09-28 DIAGNOSIS — Z515 Encounter for palliative care: Secondary | ICD-10-CM

## 2021-09-28 DIAGNOSIS — F02818 Dementia in other diseases classified elsewhere, unspecified severity, with other behavioral disturbance: Secondary | ICD-10-CM

## 2021-09-28 DIAGNOSIS — G20 Parkinson's disease: Secondary | ICD-10-CM

## 2021-10-08 NOTE — Progress Notes (Signed)
[facility] Pall[facility] Consult Note Telephone: [phone]  Fax: [phone]    Date of encounter: 09/28/2021 7:58 AM  PATIENT NAME: [name] [name] [address]   [phone] (home)  DOB: 1930/11/12 MRN: [id number] PRIMARY CARE PROVIDER:    Kirk Ruths, MD,  [address] [place] [address] [phone]  REFERRING PROVIDER:   Kirk Ruths, MD [facility] [place],   [address] [phone]  RESPONSIBLE PARTY:    Contact Information     Name Relation Home Work [employer]   [place] Daughter [phone]     [place], Termini   [phone]        I met face to face with patient and family in the facility. Palliative Care was asked to follow this patient by consultation request of  Kirk Ruths, MD to address advance care planning and complex medical decision making. This is a follow up visit.                                   ASSESSMENT AND PLAN / RECOMMENDATIONS:   Advance Care Planning/Goals of Care: Goals include to maximize quality of life and symptom management.   CODE STATUS: DNR  Symptom Management/Plan:  Parkinson's dementia-patient is dependent for all adl's, except for feeding. Staff to continue assisting with adl's. Monitor for falls/safety. Monitor for weight loss; weekly weights and nutritional supplements as directed. Continue Sinemet as directed.    Anxiety/depression-mood has been stable. Anxiety worse in evenings. Continue clonazepam, sertraline and bupropion as directed.   Follow up Palliative Care Visit: Palliative care will continue to follow for complex medical decision making, advance care planning, and clarification of goals. Return 8 weeks or prn.  I spent 15 minutes providing this consultation. More than 50% of the time in this consultation was spent in counseling and care  coordination.    PPS: 30%  HOSPICE ELIGIBILITY/DIAGNOSIS: TBD  Chief Complaint: Palliative Medicine follow up visit.   HISTORY OF PRESENT ILLNESS:  [name] [name] is a 87 y.o. year old male  with Parkinson's dementia with behavioral disturbance, atrial fibrillation, anemia,  depression, heart failure, anxiety, protein calorie malnutrition, spinal stenosis s/p lumbar laminectomy.      Patient resides at [facility] [place]. Staff report patient being stable. He requires assistance with all of his adl's. He is out of bed daily to w/c. He is able to feed himself, although it takes a long time. He is on weekly weights  Patient is eating foods he enjoys.  Patient is monitor for falls due to poor safety awareness. Staff report his mood/anxiety being stable.   Patient is received sitting up to wheelchair, he is reading newspaper.  He is able to answer direct questions. He denies having any pain or discomfort.  Denies any shortness of breath. He states he has been sleeping okay. HPI and ROS obtained from both patient and staff. A 10-point review of systems is negative, except for the pertinent positives and negatives detailed in the HPI  History obtained from review of EMR, discussion with primary team, and interview with family, facility staff/caregiver and/or [name] [name].  I reviewed available labs, medications, imaging, studies and related documents from the EMR.  Records reviewed and summarized above.   Physical Exam: Weight: 124.4 pounds Constitutional: NAD General: frail appearing, thin  EYES: anicteric sclera, lids intact, no discharge  ENMT: intact hearing, oral mucous membranes moist, dentition intact CV: S1S2, RRR, no LE edema Pulmonary: LCTA, no increased work of breathing, no cough, room air Abdomen: normo-active BS + 4 quadrants, soft and non tender GU: deferred MSK: moves all extremities, non-ambulatory Skin: cool and dry, no rashes or wounds on visible skin Neuro: generalized  weakness, A & O to person Psych: non-anxious affect, pleasant Hem/lymph/immuno: no widespread bruising   Thank you for the opportunity to participate in the care of [name] [name].  The palliative care team will continue to follow. Please call our office at [phone] if we can be of additional assistance.   Ezekiel Slocumb, NP   COVID-19 PATIENT SCREENING TOOL Asked and negative response unless otherwise noted:   Have you had symptoms of covid, tested positive or been in contact with someone with symptoms/positive test in the past 5-10 days?  No

## 2021-12-24 ENCOUNTER — Other Ambulatory Visit: Payer: Self-pay

## 2021-12-24 ENCOUNTER — Non-Acute Institutional Stay: Payer: Medicare Other | Admitting: Student

## 2021-12-24 DIAGNOSIS — F419 Anxiety disorder, unspecified: Secondary | ICD-10-CM

## 2021-12-24 DIAGNOSIS — Z515 Encounter for palliative care: Secondary | ICD-10-CM

## 2021-12-24 DIAGNOSIS — G20 Parkinson's disease: Secondary | ICD-10-CM

## 2021-12-24 NOTE — Progress Notes (Signed)
[facility] Palliative Care Consult Note Telephone: [phone]  Fax: [phone]    Date of encounter: 12/24/21 1:29 PM PATIENT NAME: [name] [name] [address] [place] [address]   [phone] (home)  DOB: [birth year] MRN: [id number] PRIMARY CARE PROVIDER:    Kirk Ruths, MD,  [address] [place] [address] [phone]  REFERRING PROVIDER:   Kirk Ruths, MD [place] [facility] [place],  [place] [address] [phone]  RESPONSIBLE PARTY:    Contact Information     Name Relation Home Work [facility] Daughter [phone]     [name] [name]   [phone]        I met face to face with patient in the facility. Palliative Care was asked to follow this patient by consultation request of  Kirk Ruths, MD to address advance care planning and complex medical decision making. This is a follow up visit.                                   ASSESSMENT AND PLAN / RECOMMENDATIONS:   Advance Care Planning/Goals of Care: Goals include to maximize quality of life and symptom management.  CODE STATUS: DNR  Symptom Management/Plan:  Parkinson's dementia-patient is dependent for all adl's, except for feeding. Staff to continue assisting with adl's. Monitor for falls/safety. Weight has been stable. Continue to monitor for weight loss; continue nutritional supplements as directed, routine weights. Continue Sinemet as directed.    Anxiety/depression-mood has been stable. Anxiety worse in evenings. Continue clonazepam, sertraline and bupropion as directed.    Follow up Palliative Care Visit: Palliative care will continue to follow for complex medical decision making, advance care planning, and clarification of goals. Return in 8 weeks or prn.  I spent 25 minutes providing this consultation. More than 50% of the time in this consultation  was spent in counseling and care coordination.   PPS: 30%  HOSPICE ELIGIBILITY/DIAGNOSIS: TBD  Chief Complaint: Palliative Medicine follow up visit.   HISTORY OF PRESENT ILLNESS:  [name] [name] is a 87 y.o. year old male  with Parkinson's dementia with behavioral disturbance, atrial fibrillation, anemia,  depression, heart failure, anxiety, protein calorie malnutrition, spinal stenosis s/p lumbar laminectomy.      Patient resides at [facility] [place]. Patient reports doing okay. He did have a fall on 12/17. He did sustain laceration to right scalp and skin tears to right elbow and right leg. Scalp is scabbed over and left open to air. Skin tears dressed by facility orders. He denies pain, shortness of breath. He is out of bed daily to w/c. He does go to dining room/common area for closer monitoring. He is sleeping okay. His appetite is fair overall. Weight has been stable. He does feed himself; does take him a long time to eat. Staff report his anxiety and mood being stable. Patient denies any concerns regarding his mood. HPI and ROS obtained from both patient and staff. A 10-point review of systems is negative, except for the pertinent positives and negatives detailed in the HPI.  History obtained from review of EMR, discussion with primary team, and interview with family, facility staff/caregiver and/or [name] [name].  I reviewed available labs, medications, imaging, studies and related documents from the EMR.  Records reviewed and summarized above.    Physical Exam: Weight: 124.8  pounds Pulse 72, resp 16, b/p 112/58 Constitutional: NAD General: frail appearing, thin EYES: anicteric sclera, lids intact, no discharge  ENMT: intact hearing, oral mucous membranes moist CV: S1S2, RRR, no LE edema Pulmonary: LCTA, no increased work of breathing, no cough, room air Abdomen: normo-active BS + 4 quadrants, soft and non tender, no ascites GU: deferred MSK: non-ambulatory Skin: warm and dry,  scab to right forehead, dressing to right elbow and right leg x 2. Neuro: generalized weakness, A & O x 2 Psych: non-anxious affect, pleasant Hem/lymph/immuno: no widespread bruising   Thank you for the opportunity to participate in the care of [name] [name].  The palliative care team will continue to follow. Please call our office at [phone] if we can be of additional assistance.   Ezekiel Slocumb, NP   COVID-19 PATIENT SCREENING TOOL Asked and negative response unless otherwise noted:   Have you had symptoms of covid, tested positive or been in contact with someone with symptoms/positive test in the past 5-10 days? No

## 2022-01-25 ENCOUNTER — Other Ambulatory Visit: Payer: Self-pay

## 2022-01-25 ENCOUNTER — Non-Acute Institutional Stay: Payer: Medicare Other | Admitting: Student

## 2022-01-25 DIAGNOSIS — F02818 Dementia in other diseases classified elsewhere, unspecified severity, with other behavioral disturbance: Secondary | ICD-10-CM

## 2022-01-25 DIAGNOSIS — Z515 Encounter for palliative care: Secondary | ICD-10-CM

## 2022-01-25 DIAGNOSIS — R296 Repeated falls: Secondary | ICD-10-CM

## 2022-01-25 DIAGNOSIS — G20 Parkinson's disease: Secondary | ICD-10-CM

## 2022-01-25 NOTE — Progress Notes (Signed)
[facility] Pall[facility] Care Consult Note Telephone: [phone]  Fax: [phone]    Date of encounter: 01/25/22 1:50 PM PATIENT NAME: [name] [name] [address] [place] [address]   [phone] (home)  DOB: 01/31/1930 MRN: [id number] PRIMARY CARE PROVIDER:    Kirk Ruths, MD,  [address] [place] [address] [phone]  REFERRING PROVIDER:   Kirk Ruths, MD [facility] [place],  [place] [address] [phone]  RESPONSIBLE PARTY:    Contact Information     Name Relation Home Work [facility] Daughter [phone]     [place], Diesel   [phone]        I met face to face with patient in the facility. Palliative Care was asked to follow this patient by consultation request of  Kirk Ruths, MD to address advance care planning and complex medical decision making. This is a follow up visit.  Message left for daughter; awaiting return call.                                    ASSESSMENT AND PLAN / RECOMMENDATIONS:   Advance Care Planning/Goals of Care: Goals include to maximize quality of life and symptom management. Patient/health care surrogate gave his/her permission to discuss. CODE STATUS: DNR  Education on Palliative Medicine vs. Hospice services. Patient has been on hospice services in the past. Will continue to monitor for changes/declines and will refer back to hospice when he meets eligibility requirements. Will review goals of care with family as patient has had several falls recently.   Symptom Management/Plan:  Parkinson's dementia-patient is dependent for all adl's, except for feeding. Staff to continue assisting with adl's; patient moving slower per staff. Patient with increased falls. Monitor for falls/safety. Weight has been stable. Continue to monitor for weight loss; continue nutritional  supplements as directed, routine weights. Patient sleeping later in the morning. Continue Sinemet as directed.    Recurrent falls-monitor closely in common area, staff to continue activities, diversional activities for patient.   Follow up Palliative Care Visit: Palliative care will continue to follow for complex medical decision making, advance care planning, and clarification of goals. Return in 4 weeks or prn.  This visit was coded based on medical decision making (MDM).  PPS: 30%  HOSPICE ELIGIBILITY/DIAGNOSIS: TBD  Chief Complaint: Palliative Medicine follow up visit.   HISTORY OF PRESENT ILLNESS:  [name] [name] is a 88 y.o. year old male  with Parkinson's dementia with behavioral disturbance, atrial fibrillation, anemia,  depression, heart failure, anxiety, protein calorie malnutrition, spinal stenosis s/p lumbar laminectomy.   Patient resides at [facility] [place]. Staff report patient requiring more assistance with adl's, moving slower in the past couple of weeks. Patient had a fall in dining room on 01/23/22 where he fell out of w/c, hit head and sustained skin tears to left hand, arm, left forehead. Family declined ED evaluation at the time. Another fall on 01/09/22. He does endorse back pain today; nurse notified and giving tylenol. Patient does answer some direct questions today. He is sleeping later in the morning per staff. His appetite is usually good. He is able to feed himself although it takes him a long time to eat. HPI and ROS obtained from both patient and staff. A 10-point review of systems is negative, except for  the pertinent positives and negatives detailed in the HPI.     History obtained from review of EMR, discussion with primary team, and interview with family, facility staff/caregiver and/or [name] [name].  I reviewed available labs, medications, imaging, studies and related documents from the EMR.  Records reviewed and summarized above.    Physical Exam: Weight:  124.3 pounds Pulse 68, resp 16, b/p 108/56 Constitutional: NAD General: frail appearing, thin EYES: anicteric sclera, lids intact, no discharge  ENMT: intact hearing, oral mucous membranes moist, dentition intact CV: S1S2, RRR, no LE edema Pulmonary: LCTA, no increased work of breathing, no cough, room air Abdomen: normo-active BS + 4 quadrants, soft and non tender, no ascites GU: deferred MSK: moves all extremities, non-ambulatory Skin: warm and dry, skin tears to left hand, forearm. Bandage to left forehead Neuro:  generalized weakness, tremor Psych: non-anxious affect, A and O to person Hem/lymph/immuno: no widespread bruising   Thank you for the opportunity to participate in the care of [name] [name].  The palliative care team will continue to follow. Please call our office at [phone] if we can be of additional assistance.   Ezekiel Slocumb, NP   COVID-19 PATIENT SCREENING TOOL Asked and negative response unless otherwise noted:   Have you had symptoms of covid, tested positive or been in contact with someone with symptoms/positive test in the past 5-10 days? No

## 2022-02-20 ENCOUNTER — Other Ambulatory Visit: Payer: Self-pay

## 2022-02-20 ENCOUNTER — Non-Acute Institutional Stay: Payer: Medicare Other | Admitting: Student

## 2022-02-20 DIAGNOSIS — Z515 Encounter for palliative care: Secondary | ICD-10-CM

## 2022-02-20 DIAGNOSIS — G20 Parkinson's disease: Secondary | ICD-10-CM

## 2022-02-20 DIAGNOSIS — R296 Repeated falls: Secondary | ICD-10-CM

## 2022-02-21 NOTE — Progress Notes (Signed)
[facility] Palliative Care Consult Note Telephone: [phone]  Fax: [phone]    Date of encounter: 02/20/2022  PATIENT NAME: [name] [name] [address] [place] [address]   [phone] (home)  DOB: Mar 01, 1930 MRN: [id number] PRIMARY CARE PROVIDER:    Kirk Ruths, MD,  [address] [place] [address] [phone]  REFERRING PROVIDER:   Kirk Ruths, MD [facility] [place],  [place] [address] [phone]  RESPONSIBLE PARTY:    Contact Information     Name Relation Home Work Mobile   [place] Daughter [phone]     [place], [place]   [phone]        I met face to face with patient and family in the facility. Palliative Care was asked to follow this patient by consultation request of  Kirk Ruths, MD to address advance care planning and complex medical decision making. This is a follow up visit.                                   ASSESSMENT AND PLAN / RECOMMENDATIONS:   Advance Care Planning/Goals of Care: Goals include to maximize quality of life and symptom management. Patient/health care surrogate gave his/her permission to discuss. Our advance care planning conversation included a discussion about:    The value and importance of advance care planning  Experiences with loved ones who have been seriously ill or have died  Exploration of personal, cultural or spiritual beliefs that might influence medical decisions  Exploration of goals of care in the event of a sudden injury or illness  CODE STATUS: DNR  Continued education on Palliative Medicine vs. Hospice services. Palliative Medicine will continue to provide support and monitor for changes and declines.   Symptom Management/Plan:  Parkinson's dementia-patient is dependent for most adl's. He is still able to feed himself which takes an extended period of  time. Patient is moving slower per staff. Patient with increased falls. Monitor for falls/safety. Weight has been stable 124.5 pounds. Continue to monitor for weight loss; continue nutritional supplements as directed, routine weights. Continue Sinemet as directed.   Recurrent falls-patient s/p fall in past 2-3 days. Monitor closely in common area, staff to continue activities, diversional activities for patient.   Follow up Palliative Care Visit: Palliative care will continue to follow for complex medical decision making, advance care planning, and clarification of goals. Return in 4 weeks or prn.  This visit was coded based on medical decision making (MDM).  PPS: 30%  HOSPICE ELIGIBILITY/DIAGNOSIS: TBD  Chief Complaint: Palliative Medicine follow up visit.   HISTORY OF PRESENT ILLNESS:  [name] [name] is a 88 y.o. year old male  with Parkinson's dementia with behavioral disturbance, atrial fibrillation, anemia,  depression, heart failure, anxiety, protein calorie malnutrition, spinal stenosis s/p lumbar laminectomy.    Patient resides at [facility] [place]. Staff report patient requiring more assistance with adl's, moving slower in the past couple of weeks. He is still able to feed himself. No worsening swallowing difficulty reported. He uses plate guard and sippy cups during meals. Patient does answer direct questions, speech is low in tone. Patient with 3 falls in the past month. HPI and ROS obtained from both patient and staff. A 10-point review of systems is negative, except for the pertinent positives and negatives detailed in the HPI.  History obtained from review of EMR, discussion with primary team, and interview with family, facility staff/caregiver and/or [name] [name].  I reviewed available labs, medications, imaging, studies and related documents from the EMR.  Records reviewed and summarized above.   Physical Exam: Weight: 124.5 01/31/2022 Pulse 72, resp 16, sats 97% on room  air Constitutional: NAD General: frail appearing, thin EYES: anicteric sclera, lids intact, no discharge  ENMT: intact hearing, oral mucous membranes moist, dentition intact CV: S1S2, RRR, no LE edema Pulmonary: LCTA, no increased work of breathing, no cough, room air Abdomen:  normo-active BS + 4 quadrants, soft and non tender GU: deferred MSK: sarcopenia, moves all extremities Skin: warm and dry, no rashes or wounds on visible skin Neuro: generalized weakness, A & O to person; +tremors Psych: non-anxious affect Hem/lymph/immuno: no widespread bruising   Thank you for the opportunity to participate in the care of [name] [name].  The palliative care team will continue to follow. Please call our office at [phone] if we can be of additional assistance.   Ezekiel Slocumb, NP   COVID-19 PATIENT SCREENING TOOL Asked and negative response unless otherwise noted:   Have you had symptoms of covid, tested positive or been in contact with someone with symptoms/positive test in the past 5-10 days? No

## 2022-04-03 ENCOUNTER — Non-Acute Institutional Stay: Payer: Medicare Other | Admitting: Student

## 2022-04-03 DIAGNOSIS — F02818 Dementia in other diseases classified elsewhere, unspecified severity, with other behavioral disturbance: Secondary | ICD-10-CM

## 2022-04-03 DIAGNOSIS — R634 Abnormal weight loss: Secondary | ICD-10-CM

## 2022-04-03 DIAGNOSIS — Z515 Encounter for palliative care: Secondary | ICD-10-CM

## 2022-04-03 NOTE — Progress Notes (Signed)
? ? ?Manufacturing engineer ?Community Palliative Care Consult Note ?Telephone: [phone]  ?Fax: [phone]  ? ? ?Date of encounter: 04/03/22 ?11:00 AM ?PATIENT NAME: [name] [name] ?[address] ?[address] ?[place] [address]   ?[phone] (home)  ?DOB: 09-28-1930 ?MRN: [id number] ?PRIMARY CARE PROVIDER:    ?Kirk Ruths, MD,  ?[address] ?[place] [address] ?[phone] ? ?REFERRING PROVIDER:   ?Kirk Ruths, MD ?[place],  [place] [address] ?[phone] ? ?RESPONSIBLE PARTY:    ?Contact Information   ? ? Name Relation Home Work Mobile  ? [facility] Daughter [phone]    ? [place], [place]   [phone]  ? ?  ? ? ? ?I met face to face with patient in the facility. Palliative Care was asked to follow this patient by consultation request of  Kirk Ruths, MD to address advance care planning and complex medical decision making. This is a follow up visit. ? ?                                 ASSESSMENT AND PLAN / RECOMMENDATIONS:  ? ?Advance Care Planning/Goals of Care: Goals include to maximize quality of life and symptom management. Patient/health care surrogate gave his/her permission to discuss. ?CODE STATUS: DNR ? ?Continued education on Palliative Medicine vs. Hospice services. Palliative Medicine will continue to provide support and monitor for changes and declines.  ? ?Symptom Management/Plan: ? ?Parkinson's dementia-patient is dependent for most adl's. He is still able to feed himself which takes an extended period of time. Patient is moving slower per staff. Monitor for falls/safety. Weight loss noted in the past month. Continue nutritional supplements as directed, routine weights. Continue Sinemet as directed. ? ?Weight loss-patient with 7 pound loss in past month. Intake varies from 25-100% of meals. Continue Eldertonic daily, magic cup TID. Staff to assist with feeding.  Recommend routine weights.  ? ?Follow up Palliative Care Visit: Palliative care will continue to follow for complex medical decision making, advance care planning, and clarification of goals. Return in 6-8 weeks or prn. ? ? ?This visit was coded based on medical decision making (MDM). ? ?PPS: 30% ? ?HOSPICE ELIGIBILITY/DIAGNOSIS: TBD ? ?Chief Complaint: Palliative Medicine follow up visit.  ? ?HISTORY OF PRESENT ILLNESS:  [name] [name] is a 88 y.o. year old male  with Parkinson's dementia with behavioral disturbance, atrial fibrillation, anemia, depression, heart failure, anxiety, protein calorie malnutrition, spinal stenosis s/p lumbar laminectomy.   ?  ?Patient resides at [facility] [place]. He has been stable per staff. He is out of bed daily; usually in common area for close monitoring. He is still able to feed himself; takes much longer. He has had some weight loss in the past month. No worsening swallowing difficulty reported. He uses plate guard and sippy cups during meals. Patient does answer direct questions, speech is low in tone. HPI and ROS primarily obtained from staff.  ? ?History obtained from review of EMR, discussion with primary team, and interview with family, facility staff/caregiver and/or [name] [name].  ?I reviewed available labs, medications, imaging, studies and related documents from the EMR.  Records reviewed and summarized above.  ? ? ?Physical Exam: ?Weight: 117 pounds ?Pulse 82, resp 16, b/o 112/60, sats 98% on room air ?Constitutional: NAD ?General: frail appearing, thin ?EYES: anicteric sclera, lids intact, no discharge  ?ENMT: intact  hearing, oral mucous membranes moist ?CV: S1S2, RRR, no LE edema ?Pulmonary: LCTA, no increased work of breathing, no cough, room air ?Abdomen: normo-active BS + 4 quadrants, soft and non tender, no ascites ?GU: deferred ?MSK: + sarcopenia, non-ambulatory ?Skin: warm and dry, no rashes, hematoma to left side of neck; etiology unknown ?Neuro:  generalized  weakness,  A & O to person, + tremors ?Psych: non-anxious affect ?Hem/lymph/immuno: no widespread bruising ? ? ?Thank you for the opportunity to participate in the care of [name] [name].  The palliative care team will continue to follow. Please call our office at [phone] if we can be of additional assistance.  ? ?Ezekiel Slocumb, NP  ? ?COVID-19 PATIENT SCREENING TOOL ?Asked and negative response unless otherwise noted:  ? ?Have you had symptoms of covid, tested positive or been in contact with someone with symptoms/positive test in the past 5-10 days? No ? ?

## 2022-05-03 ENCOUNTER — Non-Acute Institutional Stay: Payer: Medicare Other | Admitting: Student

## 2022-05-03 DIAGNOSIS — R52 Pain, unspecified: Secondary | ICD-10-CM

## 2022-05-03 DIAGNOSIS — R634 Abnormal weight loss: Secondary | ICD-10-CM

## 2022-05-03 DIAGNOSIS — G20A1 Parkinson's disease without dyskinesia, without mention of fluctuations: Secondary | ICD-10-CM

## 2022-05-03 DIAGNOSIS — Z515 Encounter for palliative care: Secondary | ICD-10-CM

## 2022-05-03 DIAGNOSIS — F02818 Dementia in other diseases classified elsewhere, unspecified severity, with other behavioral disturbance: Secondary | ICD-10-CM

## 2022-05-03 NOTE — Progress Notes (Signed)
? ? ?Manufacturing engineer ?Community Palliative Care Consult Note ?Telephone: [phone]  ?Fax: [phone]  ? ? ?Date of encounter: 05/03/22 ? ?PATIENT NAME: [name] [name] ?[address] ?[address] ?[place] [address]   ?[phone] (home)  ?DOB: [birth year] ?MRN: [id number] ?PRIMARY CARE PROVIDER:    ?Kirk Ruths, MD,  ?[address] ?[place] [address] ?[phone] ? ?REFERRING PROVIDER:   ?Kirk Ruths, MD ?[place],  [place] [address] ?[phone] ? ?RESPONSIBLE PARTY:    ?Contact Information   ? ? Name Relation Home Work Mobile  ? [facility] Daughter [phone]    ? [name] [name], [name] [name]   [phone]  ? ?  ? ? ? ?I met face to face with patient in the facility. Palliative Care was asked to follow this patient by consultation request of  Kirk Ruths, MD to address advance care planning and complex medical decision making. This is a follow up visit. ? ?Discussed with son [name] [name] via telephone.  ? ?                                 ASSESSMENT AND PLAN / RECOMMENDATIONS:  ? ?Advance Care Planning/Goals of Care: Goals include to maximize quality of life and symptom management. Patient/health care surrogate gave his/her permission to discuss. ?CODE STATUS: DNR ? ?Education provided on palliative medicine versus hospice services.  Patient with generalized decline, worsening functional status, weight loss.  Discussed with son referring back to hospice and he is in agreement.  Discussed with facility social worker. ? ? ?Symptom Management/Plan: ? ?Parkinson's dementia-patient is dependent for all ADLs.  He is now being fed by staff; he is now also pocketing foods. Patient with increased agitation, combative at times. He continues to lose weight.  Staff to continue providing supportive care, continue Sinemet as directed. ? ?Weight loss-Patient currently is 117 pounds.  He continues with poor  appetite and spite of receiving Eldertonic daily and Magic cup 3 times daily. Staff to continue assisting with feeding. ? ?Pain-patient with generalized pain; continue acetaminophen PRN. Continue cipro for UTI. ? ?Follow up Palliative Care Visit: Palliative care will continue to follow for complex medical decision making, advance care planning, and clarification of goals. Return  prn. ? ?This visit was coded based on medical decision making (MDM). ? ?PPS: 30%, weak ? ?HOSPICE ELIGIBILITY/DIAGNOSIS: TBD ? ?Chief Complaint: Palliative medicine follow-up visit. ? ?HISTORY OF PRESENT ILLNESS:  [name] [name] is a 88 y.o. year old male  with Parkinson's dementia with behavioral disturbance, atrial fibrillation, anemia, depression, heart failure, anxiety, protein calorie malnutrition, spinal stenosis s/p lumbar laminectomy.    ? ?Patient resides at [facility] [place].  Patient was started on Cipro per PCP due to gross hematuria,/urinary tract infection. Patient has been in bed more, increased combativeness with staff.  He is sleeping more throughout the day.  Staff reports decline in communication speaking much less now.  He is also being fed by staff, pocketing foods at times. ? ?Patient received resting in bed.  He does express having pain but is unable to state where he is hurting.  Staff nurse had just administered acetaminophen. Patient does not open eyes during visit today he does answer a few yes and no questions. HPI and ROS primarily obtained from staff due to patient's advanced dementia. ? ?History obtained from review of EMR, discussion  with primary team, and interview with family, facility staff/caregiver and/or Mr. Becvar.  ?I reviewed available labs, medications, imaging, studies and related documents from the EMR.  Records reviewed and summarized above.  ? ? ?Physical Exam: ?Pulse 88, respirations 16, BP 110/54 ?Constitutional: NAD ?General: frail appearing, thin, ill appearing ?EYES: anicteric sclera,  lids intact, no discharge  ?ENMT: intact hearing ?CV: S1S2, RRR, no LE edema ?Pulmonary: LCTA, no increased work of breathing, no cough, room air ?Abdomen: normo-active BS + 4 quadrants, soft and non tender, no ascites ?GU: deferred ?MSK: + sarcopenia, non-ambulatory ?Skin: warm and dry, no rashes or wounds on visible skin ?Neuro: +generalized weakness,  A & O to person, tremor ?Psych: non-anxious affect ?Hem/lymph/immuno: no widespread bruising ? ? ?Thank you for the opportunity to participate in the care of Mr. Lightner.  The palliative care team will continue to follow. Please call our office at [phone] if we can be of additional assistance.  ? ?Ezekiel Slocumb, NP  ? ?COVID-19 PATIENT SCREENING TOOL ?Asked and negative response unless otherwise noted:  ? ?Have you had symptoms of covid, tested positive or been in contact with someone with symptoms/positive test in the past 5-10 days? No ? ?

## 2022-05-08 ENCOUNTER — Non-Acute Institutional Stay: Payer: Medicare Other | Admitting: Student

## 2022-05-08 DIAGNOSIS — N390 Urinary tract infection, site not specified: Secondary | ICD-10-CM

## 2022-05-08 DIAGNOSIS — G20 Parkinson's disease: Secondary | ICD-10-CM

## 2022-05-08 DIAGNOSIS — Z515 Encounter for palliative care: Secondary | ICD-10-CM

## 2022-05-10 NOTE — Progress Notes (Signed)
? ? ?Manufacturing engineer ?Community Palliative Care Consult Note ?Telephone: [phone]  ?Fax: [phone]  ? ? ?Date of encounter: 05/08/2022 ? ?PATIENT NAME: [name] [name] ?[address] ?[address] ?[place] [address]   ?[phone] (home)  ?DOB: Feb 05, 1930 ?MRN: [id number] ?PRIMARY CARE PROVIDER:    ?Kirk Ruths, MD,  ?[address] ?[place] [address] ?[phone] ? ?REFERRING PROVIDER:   ?Kirk Ruths, MD ?[place],  [place] [address] ?[phone] ? ?RESPONSIBLE PARTY:    ?Contact Information   ? ? Name Relation Home Work Mobile  ? [facility] Daughter [phone]    ? [place], [place]   [phone]  ? ?  ? ? ? ?I met face to face with patient in the facility. Palliative Care was asked to follow this patient by consultation request of  Kirk Ruths, MD to address advance care planning and complex medical decision making. This is a follow up visit. ? ?                                 ASSESSMENT AND PLAN / RECOMMENDATIONS:  ? ?Advance Care Planning/Goals of Care: Goals include to maximize quality of life and symptom management. Patient/health care surrogate gave his/her permission to discuss. ? ?CODE STATUS: DNR ? ?Patient was referred for hospice evaluation after speaking with son. Per SW, son would like to think about it further and will make decision by Friday on whether or not to proceed with evaluation. Palliative Medicine will continue to provide ongoing support, symptom management.  ? ?Symptom Management/Plan: ? ?Parkinson's dementia-patient is dependent for all ADLs.  He is now being fed by staff; he is noted to be pocketing foods, increased swallowing difficulty. He is tolerating soft foods better. Patient with increased agitation, combative at times. Staff to continue providing supportive care, continue Sinemet as directed. Staff asked to notify palliative should patient decline  further or need comfort and symptom management.  ? ?UTI-continue antibiotics for UTI as directed. Hematuria has improved. Continue to offer adequate fluids.  ? ?Follow up Palliative Care Visit: Palliative care will continue to follow for complex medical decision making, advance care planning, and clarification of goals. Return in 1-2 weeks or prn. ? ? ?This visit was coded based on medical decision making (MDM). ? ?PPS: 30%, weak ? ?HOSPICE ELIGIBILITY/DIAGNOSIS: Parkinson's dementia. ? ?Chief Complaint: Palliative medicine follow up visit. ? ?HISTORY OF PRESENT ILLNESS:  [name] [name] is a 88 y.o. year old male  with Parkinson's dementia with behavioral disturbance, atrial fibrillation, anemia, depression, heart failure, anxiety, protein calorie malnutrition, spinal stenosis s/p lumbar laminectomy.    ? ?Patient resides at [facility]. He is currently receiving cipro for urinary tract infection. He has been afebrile the past several days. No further hematuria reported. He is out of bed today to recliner. He was fed by staff; able to eat soft foods. He continues to sleep more throughout the day.  ? ?Patient received resting in common area. He does briefly open his eyes and goes back to sleep. PAINAD-0.  ? ?History obtained from review of EMR, discussion with primary team, and interview with family, facility staff/caregiver and/or Mr. Buckner.  ?I reviewed available labs, medications, imaging, studies and related documents from the EMR.  Records reviewed and summarized above.  ? ?ROS ? ?Unable to contribute due to his dementia.  ? ?Physical  Exam: ?Pulse 84, resp 16, b/p 110/58, sats 95% on room air ?Constitutional: NAD ?General: frail appearing, thin ?EYES: anicteric sclera, lids intact, no discharge  ?ENMT: intact hearing, dentition intact ?CV: S1S2, RRR, no LE edema ?Pulmonary: LCTA except right base slightly diminished, no increased work of breathing, no cough, room air ?Abdomen: normo-active BS + 4  quadrants, soft and non tender, no ascites ?GU: deferred ?MSK: + sarcopenia, non- ambulatory ?Skin: warm and dry, no rashes or wounds on visible skin ?Neuro:  +generalized weakness, alert, oriented to person ?Psych: non-anxious affect ?Hem/lymph/immuno: no widespread bruising ? ? ?Thank you for the opportunity to participate in the care of Mr. Orlick.  The palliative care team will continue to follow. Please call our office at [phone] if we can be of additional assistance.  ? ?Ezekiel Slocumb, NP  ? ?COVID-19 PATIENT SCREENING TOOL ?Asked and negative response unless otherwise noted:  ? ?Have you had symptoms of covid, tested positive or been in contact with someone with symptoms/positive test in the past 5-10 days? No   ?

## 2022-08-30 DEATH — deceased
